# Patient Record
Sex: Female | Born: 1935 | Race: White | Hispanic: No | Smoking: Former smoker
Health system: Southern US, Community
[De-identification: ages and names within clinical notes are randomized; demographics above are authoritative.]

## PROBLEM LIST (undated history)

## (undated) DIAGNOSIS — M8580 Other specified disorders of bone density and structure, unspecified site: Secondary | ICD-10-CM

## (undated) DIAGNOSIS — S83209A Unspecified tear of unspecified meniscus, current injury, unspecified knee, initial encounter: Secondary | ICD-10-CM

## (undated) DIAGNOSIS — S82899A Other fracture of unspecified lower leg, initial encounter for closed fracture: Secondary | ICD-10-CM

## (undated) DIAGNOSIS — I10 Essential (primary) hypertension: Secondary | ICD-10-CM

## (undated) DIAGNOSIS — I491 Atrial premature depolarization: Secondary | ICD-10-CM

## (undated) DIAGNOSIS — E785 Hyperlipidemia, unspecified: Secondary | ICD-10-CM

## (undated) DIAGNOSIS — N644 Mastodynia: Secondary | ICD-10-CM

## (undated) DIAGNOSIS — N83209 Unspecified ovarian cyst, unspecified side: Secondary | ICD-10-CM

## (undated) DIAGNOSIS — K5792 Diverticulitis of intestine, part unspecified, without perforation or abscess without bleeding: Secondary | ICD-10-CM

## (undated) DIAGNOSIS — K259 Gastric ulcer, unspecified as acute or chronic, without hemorrhage or perforation: Secondary | ICD-10-CM

## (undated) HISTORY — DX: Atrial premature depolarization: I49.1

## (undated) HISTORY — DX: Unspecified ovarian cyst, unspecified side: N83.209

## (undated) HISTORY — DX: Diverticulitis of intestine, part unspecified, without perforation or abscess without bleeding: K57.92

## (undated) HISTORY — PX: MOUTH SURGERY: SHX715

## (undated) HISTORY — DX: Other fracture of unspecified lower leg, initial encounter for closed fracture: S82.899A

## (undated) HISTORY — PX: OTHER SURGICAL HISTORY: SHX169

## (undated) HISTORY — DX: Hyperlipidemia, unspecified: E78.5

## (undated) HISTORY — DX: Mastodynia: N64.4

## (undated) HISTORY — DX: Unspecified tear of unspecified meniscus, current injury, unspecified knee, initial encounter: S83.209A

## (undated) HISTORY — DX: Essential (primary) hypertension: I10

## (undated) HISTORY — DX: Gastric ulcer, unspecified as acute or chronic, without hemorrhage or perforation: K25.9

## (undated) HISTORY — DX: Other specified disorders of bone density and structure, unspecified site: M85.80

---

## 1976-06-11 HISTORY — PX: BACK SURGERY: SHX140

## 1985-06-11 HISTORY — PX: ANKLE SURGERY: SHX546

## 1998-02-21 ENCOUNTER — Other Ambulatory Visit: Admission: RE | Admit: 1998-02-21 | Discharge: 1998-02-21 | Payer: Self-pay | Admitting: Obstetrics and Gynecology

## 1998-10-05 ENCOUNTER — Ambulatory Visit (HOSPITAL_COMMUNITY): Admission: RE | Admit: 1998-10-05 | Discharge: 1998-10-05 | Payer: Self-pay | Admitting: Gastroenterology

## 1999-05-15 ENCOUNTER — Other Ambulatory Visit: Admission: RE | Admit: 1999-05-15 | Discharge: 1999-05-15 | Payer: Self-pay | Admitting: Obstetrics and Gynecology

## 2000-01-22 ENCOUNTER — Encounter: Admission: RE | Admit: 2000-01-22 | Discharge: 2000-01-22 | Payer: Self-pay | Admitting: Obstetrics and Gynecology

## 2000-01-22 ENCOUNTER — Encounter: Payer: Self-pay | Admitting: Obstetrics and Gynecology

## 2000-06-24 ENCOUNTER — Other Ambulatory Visit: Admission: RE | Admit: 2000-06-24 | Discharge: 2000-06-24 | Payer: Self-pay | Admitting: Obstetrics and Gynecology

## 2000-09-26 ENCOUNTER — Encounter: Admission: RE | Admit: 2000-09-26 | Discharge: 2000-09-26 | Payer: Self-pay | Admitting: Obstetrics and Gynecology

## 2000-09-26 ENCOUNTER — Encounter: Payer: Self-pay | Admitting: Obstetrics and Gynecology

## 2001-01-23 ENCOUNTER — Encounter: Admission: RE | Admit: 2001-01-23 | Discharge: 2001-01-23 | Payer: Self-pay | Admitting: Internal Medicine

## 2001-01-23 ENCOUNTER — Encounter: Payer: Self-pay | Admitting: Internal Medicine

## 2001-06-26 ENCOUNTER — Other Ambulatory Visit: Admission: RE | Admit: 2001-06-26 | Discharge: 2001-06-26 | Payer: Self-pay | Admitting: Obstetrics and Gynecology

## 2002-01-29 ENCOUNTER — Encounter: Payer: Self-pay | Admitting: Obstetrics and Gynecology

## 2002-01-29 ENCOUNTER — Encounter: Admission: RE | Admit: 2002-01-29 | Discharge: 2002-01-29 | Payer: Self-pay | Admitting: Obstetrics and Gynecology

## 2002-07-14 ENCOUNTER — Other Ambulatory Visit: Admission: RE | Admit: 2002-07-14 | Discharge: 2002-07-14 | Payer: Self-pay | Admitting: Obstetrics and Gynecology

## 2003-02-08 ENCOUNTER — Encounter: Payer: Self-pay | Admitting: Obstetrics and Gynecology

## 2003-02-08 ENCOUNTER — Encounter: Admission: RE | Admit: 2003-02-08 | Discharge: 2003-02-08 | Payer: Self-pay | Admitting: Obstetrics and Gynecology

## 2004-02-16 ENCOUNTER — Encounter: Admission: RE | Admit: 2004-02-16 | Discharge: 2004-02-16 | Payer: Self-pay | Admitting: Obstetrics and Gynecology

## 2004-08-01 ENCOUNTER — Other Ambulatory Visit: Admission: RE | Admit: 2004-08-01 | Discharge: 2004-08-01 | Payer: Self-pay | Admitting: *Deleted

## 2005-02-28 ENCOUNTER — Encounter: Admission: RE | Admit: 2005-02-28 | Discharge: 2005-02-28 | Payer: Self-pay | Admitting: Internal Medicine

## 2006-03-27 ENCOUNTER — Encounter: Admission: RE | Admit: 2006-03-27 | Discharge: 2006-03-27 | Payer: Self-pay | Admitting: Internal Medicine

## 2006-08-19 ENCOUNTER — Other Ambulatory Visit: Admission: RE | Admit: 2006-08-19 | Discharge: 2006-08-19 | Payer: Self-pay | Admitting: Obstetrics & Gynecology

## 2006-08-27 ENCOUNTER — Encounter: Admission: RE | Admit: 2006-08-27 | Discharge: 2006-08-27 | Payer: Self-pay | Admitting: Obstetrics & Gynecology

## 2007-04-07 ENCOUNTER — Encounter: Admission: RE | Admit: 2007-04-07 | Discharge: 2007-04-07 | Payer: Self-pay | Admitting: Internal Medicine

## 2008-04-14 ENCOUNTER — Encounter: Admission: RE | Admit: 2008-04-14 | Discharge: 2008-04-14 | Payer: Self-pay | Admitting: Internal Medicine

## 2008-08-25 ENCOUNTER — Other Ambulatory Visit: Admission: RE | Admit: 2008-08-25 | Discharge: 2008-08-25 | Payer: Self-pay | Admitting: Obstetrics & Gynecology

## 2008-09-01 ENCOUNTER — Encounter: Admission: RE | Admit: 2008-09-01 | Discharge: 2008-09-01 | Payer: Self-pay | Admitting: Obstetrics & Gynecology

## 2009-04-28 ENCOUNTER — Encounter: Admission: RE | Admit: 2009-04-28 | Discharge: 2009-04-28 | Payer: Self-pay | Admitting: Internal Medicine

## 2010-05-02 ENCOUNTER — Encounter: Admission: RE | Admit: 2010-05-02 | Discharge: 2010-05-02 | Payer: Self-pay | Admitting: Obstetrics & Gynecology

## 2011-04-02 ENCOUNTER — Other Ambulatory Visit: Payer: Self-pay | Admitting: Obstetrics & Gynecology

## 2011-04-02 DIAGNOSIS — Z1231 Encounter for screening mammogram for malignant neoplasm of breast: Secondary | ICD-10-CM

## 2011-04-17 ENCOUNTER — Encounter: Payer: Self-pay | Admitting: Cardiovascular Disease

## 2011-04-18 ENCOUNTER — Encounter: Payer: Self-pay | Admitting: Cardiovascular Disease

## 2011-04-18 ENCOUNTER — Ambulatory Visit (INDEPENDENT_AMBULATORY_CARE_PROVIDER_SITE_OTHER): Payer: Medicare Other | Admitting: Cardiovascular Disease

## 2011-04-18 DIAGNOSIS — R079 Chest pain, unspecified: Secondary | ICD-10-CM | POA: Insufficient documentation

## 2011-04-18 NOTE — Patient Instructions (Signed)
Your physician recommends that you schedule a follow-up appointment in: 2 weeks  Your physician has requested that you have en exercise stress myoview. For further information please visit [email or web address]. Please follow instruction sheet, as given.   

## 2011-04-18 NOTE — Assessment & Plan Note (Signed)
Risk factors for CAD include HTN, HLD, age, family history. Will arrange exercise stress myoview this week to exclude ischemia.

## 2011-04-18 NOTE — Progress Notes (Signed)
History of Present Illness:77 yo female with history of HTN, HLD, former tobacco abuse, GERD here today for new patient cardiac evaluation. She tells me that she was out shopping and felt dyspnea. She had no chest discomfort during that episode. She felt weak and sat down. No dizziness. No recent illnesses. She had been bitten by a tick several days before her episode. No recurrent episodes of dyspnea. She does describe frequent episodes of upper epigastric pain with rest and with exertion. This has gotten worse over the last few weeks.   Her primary care is Dr. Jacky Kindle with [facility].    Past Medical History  Diagnosis Date  . Hyperlipidemia   . Hypertension   . Osteopenia     Past Surgical History  Procedure Date  . Back surgery 1978  . Ankle surgery 1987  . Cataracts     Current Outpatient Prescriptions  Medication Sig Dispense Refill  . aspirin 81 MG tablet Take 81 mg by mouth daily.        . butabarbital (BUTISOL SODIUM) 30 MG tablet 1 tab as needed       . cholecalciferol (VITAMIN D) 1000 UNITS tablet Take 2,000 Units by mouth daily.       Marland Kitchen OVER THE COUNTER MEDICATION calicium w/ mag  daily       . rosuvastatin (CRESTOR) 10 MG tablet 10 mg. Take 1/2 tab daily      . triamterene-hydrochlorothiazide (MAXZIDE) 75-50 MG per tablet Take 1 tablet by mouth daily.        . verapamil (CALAN-SR) 240 MG CR tablet Take 240 mg by mouth daily.          Allergies  Allergen Reactions  . Codeine   . Penicillins     History   Social History  . Marital Status: Single    Spouse Name: N/A    Number of Children: N/A  . Years of Education: N/A   Occupational History  . Not on file.   Social History Main Topics  . Smoking status: Former Games developer  . Smokeless tobacco: Not on file   Comment: 20 yrs ago.  . Alcohol Use: 3.0 oz/week    5 Glasses of wine per week  . Drug Use: No  . Sexually Active:    Other Topics Concern  . Not on file   Social History Narrative    . No narrative on file    No family history on file.  Review of Systems:  As stated in the HPI and otherwise negative.   BP 145/78  Pulse 85  Ht 5' (1.524 m)  Wt 117 lb (53.071 kg)  BMI 22.85 kg/m2  Physical Examination: General: Well developed, well nourished, NAD HEENT: OP clear, mucus membranes moist SKIN: warm, dry. No rashes. Neuro: No focal deficits Musculoskeletal: Muscle strength 5/5 all ext Psychiatric: Mood and affect normal Neck: No JVD, no carotid bruits, no thyromegaly, no lymphadenopathy. Lungs:Clear bilaterally, no wheezes, rhonci, crackles Cardiovascular: Regular rate and rhythm. No murmurs, gallops or rubs. Abdomen:Soft. Bowel sounds present. Non-tender.  Extremities: No lower extremity edema. Pulses are 2 + in the bilateral DP/PT.  WUJ:WJXBJY sinus rhythm.

## 2011-04-19 ENCOUNTER — Ambulatory Visit (HOSPITAL_COMMUNITY): Payer: Medicare Other | Attending: Internal Medicine | Admitting: Radiology

## 2011-04-19 DIAGNOSIS — R61 Generalized hyperhidrosis: Secondary | ICD-10-CM | POA: Insufficient documentation

## 2011-04-19 DIAGNOSIS — R0789 Other chest pain: Secondary | ICD-10-CM | POA: Insufficient documentation

## 2011-04-19 DIAGNOSIS — R079 Chest pain, unspecified: Secondary | ICD-10-CM

## 2011-04-19 DIAGNOSIS — I10 Essential (primary) hypertension: Secondary | ICD-10-CM | POA: Insufficient documentation

## 2011-04-19 DIAGNOSIS — R0602 Shortness of breath: Secondary | ICD-10-CM

## 2011-04-19 DIAGNOSIS — E785 Hyperlipidemia, unspecified: Secondary | ICD-10-CM | POA: Insufficient documentation

## 2011-04-19 DIAGNOSIS — R9431 Abnormal electrocardiogram [ECG] [EKG]: Secondary | ICD-10-CM

## 2011-04-19 DIAGNOSIS — R5381 Other malaise: Secondary | ICD-10-CM | POA: Insufficient documentation

## 2011-04-19 DIAGNOSIS — R42 Dizziness and giddiness: Secondary | ICD-10-CM | POA: Insufficient documentation

## 2011-04-19 DIAGNOSIS — Z8249 Family history of ischemic heart disease and other diseases of the circulatory system: Secondary | ICD-10-CM | POA: Insufficient documentation

## 2011-04-19 DIAGNOSIS — R0609 Other forms of dyspnea: Secondary | ICD-10-CM | POA: Insufficient documentation

## 2011-04-19 DIAGNOSIS — R0989 Other specified symptoms and signs involving the circulatory and respiratory systems: Secondary | ICD-10-CM | POA: Insufficient documentation

## 2011-04-19 DIAGNOSIS — Z87891 Personal history of nicotine dependence: Secondary | ICD-10-CM | POA: Insufficient documentation

## 2011-04-19 MED ORDER — TECHNETIUM TC 99M TETROFOSMIN IV KIT
11.0000 | PACK | Freq: Once | INTRAVENOUS | Status: AC | PRN
  Administered 2011-04-19: 11 via INTRAVENOUS

## 2011-04-19 MED ORDER — REGADENOSON 0.4 MG/5ML IV SOLN
0.4000 mg | Freq: Once | INTRAVENOUS | Status: AC
  Administered 2011-04-19: 0.4 mg via INTRAVENOUS

## 2011-04-19 MED ORDER — TECHNETIUM TC 99M TETROFOSMIN IV KIT
33.0000 | PACK | Freq: Once | INTRAVENOUS | Status: AC | PRN
  Administered 2011-04-19: 33 via INTRAVENOUS

## 2011-04-19 NOTE — Progress Notes (Signed)
[facility] SITE 3 NUCLEAR MED [address] [place] [address] [phone]  Cardiology Nuclear Med Study  Lori Boone is a 77 y.o. female [id number] 10-14-1925   Nuclear Med Background Indication for Stress Test:  Evaluation for Ischemia History:  No previous documented CAD Cardiac Risk Factors: Family History - CAD, History of Smoking, Hypertension and Lipids  Symptoms:  Chest Pain/"Discomfort" with and without Exertion (last episode of chest discomfort was about 3-days ago), Diaphoresis, Dizziness, DOE and Fatigue   Nuclear Pre-Procedure Caffeine/Decaff Intake:  None NPO After: 8:00pm   Lungs:  Clear.  O2 SAT 98% on RA. IV 0.9% NS with Angio Cath:  20g  IV Site: R Antecubital x 1, tolerated well IV Started by:  Irean Hong, RN  Chest Size (in):  36 Cup Size: B  Height: 5' (1.524 m)  Weight:  114 lb (51.71 kg)  BMI:  Body mass index is 22.26 kg/(m^2). Tech Comments:  No medications today.    Nuclear Med Study 1 or 2 day study: 1 day  Stress Test Type:  Eugenie Birks  Reading MD: Dietrich Pates, MD  Order Authorizing Provider:  Earney Hamburg, MD  Resting Radionuclide: Technetium 64m Tetrofosmin  Resting Radionuclide Dose: 11.0 mCi   Stress Radionuclide:  Technetium 36m Tetrofosmin  Stress Radionuclide Dose: 33.0 mCi           Stress Protocol Rest HR: 68 Stress HR: 123  Rest BP: 137/79 Stress BP: 149/81  Exercise Time (min): n/a METS: n/a   Predicted Max HR: 137 bpm % Max HR: 89.78 bpm Rate Pressure Product: 69629   Dose of Adenosine (mg):  n/a Dose of Lexiscan: 0.4 mg  Dose of Atropine (mg): n/a Dose of Dobutamine: n/a mcg/kg/min (at max HR)  Stress Test Technologist: Smiley Houseman, CMA-N  Nuclear Technologist:  Domenic Polite, CNMT     Rest Procedure:  Myocardial perfusion imaging was performed at rest 45 minutes following the intravenous administration of Technetium 33m Tetrofosmin.  Rest ECG: Nonspecific ST-T wave changes.  Stress  Procedure:  The patient was initially scheduled to walk the treadmill utilizing the Bruce protocol, but due to baseline EKG changes and no EKG for comparison, Dr. Elease Hashimoto felt it was  better to do a Lexiscan on the patient.  She then received IV Lexiscan 0.4 mg over 15-seconds.  Technetium 71m Tetrofosmin injected at 30-seconds.  There were no significant changes with Lexiscan.  Quantitative spect images were obtained after a 45 minute delay.  Stress BMW:UXLK than 1 mm ST depression in the inferior leads.  QPS Raw Data Images:  Normal; no motion artifact; normal heart/lung ratio. Stress Images:  Normal homogeneous uptake in all areas of the myocardium. Rest Images:  Normal homogeneous uptake in all areas of the myocardium. Subtraction (SDS):  No evidence of ischemia. Transient Ischemic Dilatation (Normal <1.22):  0.87 Lung/Heart Ratio (Normal <0.45):  0.10  Quantitative Gated Spect Images QGS EDV:  34 ml QGS ESV:  5 ml QGS cine images:  NL LV Function; NL Wall Motion QGS EF: 84%  Impression Exercise Capacity:  Lexiscan with no exercise. BP Response:  Normal blood pressure response. Clinical Symptoms:  No chest pain. ECG Impression:  Less than 1 mm ST depression in the inferior leads. Comparison with Prior Nuclear Study: No previous nuclear study performed  Overall Impression:  Normal stress nuclear study.  LVEF greater than 70%. Dietrich Pates

## 2011-04-20 ENCOUNTER — Telehealth: Payer: Self-pay | Admitting: Cardiovascular Disease

## 2011-04-20 NOTE — Telephone Encounter (Signed)
Spoke with pt and gave her preliminary report of normal nuclear study. I told her we would call her back if Dr. Clifton James had any additional comments. She has follow up appointment with Dr. Clifton James later this month.

## 2011-04-20 NOTE — Telephone Encounter (Signed)
New message:  Pt had nuclear yesterday and daughter wants results before she leaves town this afternoon.  Please call and let her know results.

## 2011-04-24 ENCOUNTER — Telehealth: Payer: Self-pay | Admitting: *Deleted

## 2011-04-24 NOTE — Telephone Encounter (Signed)
Message copied by Burnell Blanks on Tue Apr 24, 2011 10:04 AM ------      Message from: Verne Carrow D      Created: Mon Apr 23, 2011 10:49 AM       Stress test is normal. No evidence of ischemia. Can we let the pt know? Thanks, chris

## 2011-04-24 NOTE — Telephone Encounter (Signed)
Advised of stress test results 

## 2011-04-30 ENCOUNTER — Ambulatory Visit (INDEPENDENT_AMBULATORY_CARE_PROVIDER_SITE_OTHER): Payer: Medicare Other | Admitting: Cardiovascular Disease

## 2011-04-30 ENCOUNTER — Encounter: Payer: Self-pay | Admitting: Cardiovascular Disease

## 2011-04-30 VITALS — BP 128/68 | HR 74 | Ht 59.0 in | Wt 117.0 lb

## 2011-04-30 DIAGNOSIS — R0600 Dyspnea, unspecified: Secondary | ICD-10-CM | POA: Insufficient documentation

## 2011-04-30 DIAGNOSIS — R0989 Other specified symptoms and signs involving the circulatory and respiratory systems: Secondary | ICD-10-CM

## 2011-04-30 NOTE — Patient Instructions (Signed)
Your physician wants you to follow-up in: 12 months.  You will receive a reminder letter in the mail two months in advance. If you don't receive a letter, please call our office to schedule the follow-up appointment.  Your physician recommends that you continue on your current medications as directed. Please refer to the Current Medication list given to you today.   

## 2011-04-30 NOTE — Progress Notes (Signed)
History of Present Illness:77 yo female with history of HTN, HLD, former tobacco abuse, GERD here today for cardiac follow up. I saw her as a new patient two weeks ago for evaluation of dyspnea.  She told  me that she was out shopping and felt dyspnea. She had no chest discomfort during that episode. She felt weak and sat down. No dizziness. No recent illnesses. She had been bitten by a tick several days before her episode. No recurrent episodes of dyspnea. She did describe frequent episodes of upper epigastric pain with rest and with exertion. I arranged a stress myoview. Given her baseline EKG changes, Lexiscan was used. Her LVEF was 84%. There was no evidence of ischemia.  She has had no recurrence of her dyspnea. No chest pain or dizziness. She feels great.   Her primary care is Dr. Jacky Kindle with [facility].    Past Medical History  Diagnosis Date  . Hyperlipidemia   . Hypertension   . Osteopenia     Past Surgical History  Procedure Date  . Back surgery 1978  . Ankle surgery 1987  . Cataracts     Current Outpatient Prescriptions  Medication Sig Dispense Refill  . aspirin 81 MG tablet Take 81 mg by mouth daily.        . butabarbital (BUTISOL SODIUM) 30 MG tablet 1 tab as needed       . cholecalciferol (VITAMIN D) 1000 UNITS tablet Take 2,000 Units by mouth daily.       Marland Kitchen OVER THE COUNTER MEDICATION calicium w/ mag  daily       . rosuvastatin (CRESTOR) 10 MG tablet 10 mg. Take 1/2 tab daily      . triamterene-hydrochlorothiazide (MAXZIDE) 75-50 MG per tablet Take 1 tablet by mouth daily.        . verapamil (CALAN-SR) 240 MG CR tablet Take 240 mg by mouth daily.         Allergies  Allergen Reactions  . Codeine   . Penicillins     History   Social History  . Marital Status: Single    Spouse Name: N/A    Number of Children: 2  . Years of Education: N/A   Occupational History  . Retired office work    Social History Main Topics  . Smoking status: Never  Smoker   . Smokeless tobacco: Not on file   Comment: 20 yrs ago.  . Alcohol Use: 3.0 oz/week    5 Glasses of wine per week  . Drug Use: No  . Sexually Active: Not on file   Other Topics Concern  . Not on file   Social History Narrative  . No narrative on file    Family History  Problem Relation Age of Onset  . Coronary artery disease Father   . Heart attack Father   . Heart failure Mother     Review of Systems:  As stated in the HPI and otherwise negative.   BP 128/68  Pulse 74  Ht 4\' 11"  (1.499 m)  Wt 117 lb (53.071 kg)  BMI 23.63 kg/m2  Physical Examination: General: Well developed, well nourished, NAD HEENT: OP clear, mucus membranes moist SKIN: warm, dry. No rashes. Neuro: No focal deficits Musculoskeletal: Muscle strength 5/5 all ext Psychiatric: Mood and affect normal Neck: No JVD, no carotid bruits, no thyromegaly, no lymphadenopathy. Lungs:Clear bilaterally, no wheezes, rhonci, crackles Cardiovascular: Regular rate and rhythm. No murmurs, gallops or rubs. Abdomen:Soft. Bowel sounds present. Non-tender.  Extremities: No  lower extremity edema. Pulses are 2 + in the bilateral DP/PT.  Stress myoview: 04/20/11:   Stress Procedure: The patient was initially scheduled to walk the treadmill utilizing the Bruce protocol, but due to baseline EKG changes and no EKG for comparison, Dr. Elease Hashimoto felt it was better to do a Lexiscan on the patient. She then received IV Lexiscan 0.4 mg over 15-seconds. Technetium 63m Tetrofosmin injected at 30-seconds. There were no significant changes with Lexiscan. Quantitative spect images were obtained after a 45 minute delay.  Stress ZOX:WRUE than 1 mm ST depression in the inferior leads.  QPS  Raw Data Images: Normal; no motion artifact; normal heart/lung ratio.  Stress Images: Normal homogeneous uptake in all areas of the myocardium.  Rest Images: Normal homogeneous uptake in all areas of the myocardium.  Subtraction (SDS): No evidence  of ischemia.  Transient Ischemic Dilatation (Normal <1.22): 0.87  Lung/Heart Ratio (Normal <0.45): 0.10  Quantitative Gated Spect Images  QGS EDV: 34 ml  QGS ESV: 5 ml  QGS cine images: NL LV Function; NL Wall Motion  QGS EF: 84%  Impression  Exercise Capacity: Lexiscan with no exercise.  BP Response: Normal blood pressure response.  Clinical Symptoms: No chest pain.  ECG Impression: Less than 1 mm ST depression in the inferior leads.  Comparison with Prior Nuclear Study: No previous nuclear study performed  Overall Impression: Normal stress nuclear study. LVEF greater than 70%.

## 2011-04-30 NOTE — Assessment & Plan Note (Signed)
No evidence of ischemia on stress test. LV function is normal. No further cardiac workup.

## 2011-05-07 ENCOUNTER — Ambulatory Visit: Payer: Self-pay

## 2011-05-21 ENCOUNTER — Ambulatory Visit
Admission: RE | Admit: 2011-05-21 | Discharge: 2011-05-21 | Disposition: A | Discharge status: Home / Self Care | Payer: Medicare Other | Source: Ambulatory Visit | Attending: Obstetrics & Gynecology | Admitting: Obstetrics & Gynecology

## 2011-05-21 DIAGNOSIS — Z1231 Encounter for screening mammogram for malignant neoplasm of breast: Secondary | ICD-10-CM

## 2011-06-19 DIAGNOSIS — E785 Hyperlipidemia, unspecified: Secondary | ICD-10-CM | POA: Diagnosis not present

## 2011-06-19 DIAGNOSIS — R82998 Other abnormal findings in urine: Secondary | ICD-10-CM | POA: Diagnosis not present

## 2011-06-19 DIAGNOSIS — I10 Essential (primary) hypertension: Secondary | ICD-10-CM | POA: Diagnosis not present

## 2011-06-26 DIAGNOSIS — Z0000 Encounter for general adult medical examination without abnormal findings: Secondary | ICD-10-CM | POA: Diagnosis not present

## 2011-06-26 DIAGNOSIS — M1990 Unspecified osteoarthritis, unspecified site: Secondary | ICD-10-CM | POA: Diagnosis not present

## 2011-06-26 DIAGNOSIS — E785 Hyperlipidemia, unspecified: Secondary | ICD-10-CM | POA: Diagnosis not present

## 2011-06-26 DIAGNOSIS — I10 Essential (primary) hypertension: Secondary | ICD-10-CM | POA: Diagnosis not present

## 2011-06-27 DIAGNOSIS — Z1212 Encounter for screening for malignant neoplasm of rectum: Secondary | ICD-10-CM | POA: Diagnosis not present

## 2011-06-27 DIAGNOSIS — N83209 Unspecified ovarian cyst, unspecified side: Secondary | ICD-10-CM | POA: Diagnosis not present

## 2011-07-11 DIAGNOSIS — D485 Neoplasm of uncertain behavior of skin: Secondary | ICD-10-CM | POA: Diagnosis not present

## 2011-07-11 DIAGNOSIS — D235 Other benign neoplasm of skin of trunk: Secondary | ICD-10-CM | POA: Diagnosis not present

## 2011-10-05 ENCOUNTER — Ambulatory Visit (INDEPENDENT_AMBULATORY_CARE_PROVIDER_SITE_OTHER): Payer: Medicare Other | Admitting: Family Medicine

## 2011-10-05 ENCOUNTER — Telehealth: Payer: Self-pay

## 2011-10-05 VITALS — BP 168/68 | HR 79 | Temp 97.8°F | Resp 18 | Ht 60.0 in | Wt 120.0 lb

## 2011-10-05 DIAGNOSIS — J321 Chronic frontal sinusitis: Secondary | ICD-10-CM | POA: Diagnosis not present

## 2011-10-05 DIAGNOSIS — R040 Epistaxis: Secondary | ICD-10-CM | POA: Diagnosis not present

## 2011-10-05 DIAGNOSIS — J019 Acute sinusitis, unspecified: Secondary | ICD-10-CM | POA: Diagnosis not present

## 2011-10-05 DIAGNOSIS — J3489 Other specified disorders of nose and nasal sinuses: Secondary | ICD-10-CM | POA: Diagnosis not present

## 2011-10-05 MED ORDER — MOMETASONE FUROATE 50 MCG/ACT NA SUSP: 2.0000 | Freq: Every day | NASAL | Status: DC

## 2011-10-05 MED ORDER — LEVOFLOXACIN 500 MG PO TABS: 500.0000 mg | ORAL_TABLET | Freq: Every day | ORAL | Status: AC

## 2011-10-05 NOTE — Patient Instructions (Signed)

## 2011-10-05 NOTE — Progress Notes (Signed)
78 yo woman with facial pressure and pain for 2 weeks, now worsening and pain is not controlled by tylenol and saline washes.   Associated malaise, headache, occoasional right epistaxis, and lightheadedness.  O:  NAD, alert Neck:  Supple no adenopathy TM's normal Nose:  Dried blood right nasal passage Oroph:  Clear  A:  Chronic sinusitis  P:  levaquin nasonex CT scan

## 2011-10-06 ENCOUNTER — Other Ambulatory Visit: Payer: Self-pay | Admitting: Family Medicine

## 2011-10-06 ENCOUNTER — Ambulatory Visit (HOSPITAL_BASED_OUTPATIENT_CLINIC_OR_DEPARTMENT_OTHER)
Admission: RE | Admit: 2011-10-06 | Discharge: 2011-10-06 | Disposition: A | Discharge status: Home / Self Care | Payer: Medicare Other | Source: Ambulatory Visit | Attending: Family Medicine | Admitting: Family Medicine

## 2011-10-06 DIAGNOSIS — G501 Atypical facial pain: Secondary | ICD-10-CM

## 2011-10-06 DIAGNOSIS — R040 Epistaxis: Secondary | ICD-10-CM | POA: Diagnosis not present

## 2011-10-06 DIAGNOSIS — J342 Deviated nasal septum: Secondary | ICD-10-CM | POA: Diagnosis not present

## 2011-10-06 DIAGNOSIS — J3489 Other specified disorders of nose and nasal sinuses: Secondary | ICD-10-CM | POA: Insufficient documentation

## 2011-10-08 NOTE — Progress Notes (Signed)
LMOM that everything was normal on ct

## 2011-11-27 DIAGNOSIS — Z01419 Encounter for gynecological examination (general) (routine) without abnormal findings: Secondary | ICD-10-CM | POA: Diagnosis not present

## 2011-11-27 DIAGNOSIS — Z124 Encounter for screening for malignant neoplasm of cervix: Secondary | ICD-10-CM | POA: Diagnosis not present

## 2011-12-24 DIAGNOSIS — I10 Essential (primary) hypertension: Secondary | ICD-10-CM | POA: Diagnosis not present

## 2011-12-24 DIAGNOSIS — M1990 Unspecified osteoarthritis, unspecified site: Secondary | ICD-10-CM | POA: Diagnosis not present

## 2011-12-24 DIAGNOSIS — E785 Hyperlipidemia, unspecified: Secondary | ICD-10-CM | POA: Diagnosis not present

## 2011-12-27 ENCOUNTER — Ambulatory Visit (INDEPENDENT_AMBULATORY_CARE_PROVIDER_SITE_OTHER): Payer: Medicare Other | Admitting: *Deleted

## 2011-12-27 DIAGNOSIS — R0989 Other specified symptoms and signs involving the circulatory and respiratory systems: Secondary | ICD-10-CM | POA: Diagnosis not present

## 2012-01-07 NOTE — Procedures (Unsigned)
CAROTID DUPLEX EXAM  INDICATION:  Bruit  HISTORY: Diabetes:  No Cardiac:  No Hypertension:  Yes Smoking:  Previous Previous Surgery: CV History:  Asymptomatic Amaurosis Fugax No, Paresthesias __________, Hemiparesis No                                      RIGHT             LEFT Brachial systolic pressure:         144               144 Brachial Doppler waveforms:         Triphasic         Triphasic Vertebral direction of flow:        Antegrade         Antegrade DUPLEX VELOCITIES (cm/sec) CCA peak systolic                   109               106 ECA peak systolic                   88                79 ICA peak systolic                   126 (distal ICA)  104 ICA end diastolic                   33                20 PLAQUE MORPHOLOGY:                  Homogeneous       Homogeneous PLAQUE AMOUNT:                      Minimal           Minimal PLAQUE LOCATION:                    ICA               ECA  IMPRESSION:  1-39% ICA stenosis bilaterally. Vertebral artery flow antegrade bilaterally.  ___________________________________________ V. Charlena Cross, MD  SS/MEDQ  D:  12/27/2011  T:  12/27/2011  Job:  [id number]

## 2012-01-09 DIAGNOSIS — H33329 Round hole, unspecified eye: Secondary | ICD-10-CM | POA: Diagnosis not present

## 2012-01-09 DIAGNOSIS — H04129 Dry eye syndrome of unspecified lacrimal gland: Secondary | ICD-10-CM | POA: Diagnosis not present

## 2012-01-09 DIAGNOSIS — Z961 Presence of intraocular lens: Secondary | ICD-10-CM | POA: Diagnosis not present

## 2012-03-29 DIAGNOSIS — Z23 Encounter for immunization: Secondary | ICD-10-CM | POA: Diagnosis not present

## 2012-04-15 ENCOUNTER — Other Ambulatory Visit: Payer: Self-pay | Admitting: Obstetrics & Gynecology

## 2012-04-15 DIAGNOSIS — Z1231 Encounter for screening mammogram for malignant neoplasm of breast: Secondary | ICD-10-CM

## 2012-05-05 ENCOUNTER — Encounter: Payer: Self-pay | Admitting: Cardiovascular Disease

## 2012-05-05 ENCOUNTER — Ambulatory Visit (INDEPENDENT_AMBULATORY_CARE_PROVIDER_SITE_OTHER): Payer: Medicare Other | Admitting: Cardiovascular Disease

## 2012-05-05 VITALS — BP 160/79 | HR 78 | Ht 59.0 in | Wt 121.0 lb

## 2012-05-05 DIAGNOSIS — R002 Palpitations: Secondary | ICD-10-CM

## 2012-05-05 LAB — CBC WITH DIFFERENTIAL/PLATELET
Basophils Absolute: 0 10*3/uL (ref 0.0–0.1)
Eosinophils Absolute: 0.1 10*3/uL (ref 0.0–0.7)
HCT: 43.9 % (ref 36.0–46.0)
Hemoglobin: 14.4 g/dL (ref 12.0–15.0)
Lymphocytes Relative: 38.9 % (ref 12.0–46.0)
Lymphs Abs: 3.5 10*3/uL (ref 0.7–4.0)
Monocytes Relative: 8.5 % (ref 3.0–12.0)
Neutrophils Relative %: 51.2 % (ref 43.0–77.0)
RBC: 4.64 Mil/uL (ref 3.87–5.11)
RDW: 12.9 % (ref 11.5–14.6)

## 2012-05-05 LAB — BASIC METABOLIC PANEL
Creatinine, Ser: 0.9 mg/dL (ref 0.4–1.2)
Glucose, Bld: 97 mg/dL (ref 70–99)

## 2012-05-05 NOTE — Patient Instructions (Signed)
Your physician recommends that you schedule a follow-up appointment in:  6 weeks.   Your physician has recommended that you wear an event monitor. Event monitors are medical devices that record the heart's electrical activity. Doctors most often [place] these monitors to diagnose arrhythmias. Arrhythmias are problems with the speed or rhythm of the heartbeat. The monitor is a small, portable device. You can wear one while you do your normal daily activities. This is usually used to diagnose what is causing palpitations/syncope (passing out).

## 2012-05-05 NOTE — Progress Notes (Signed)
History of Present Illness: 78 yo female with history of HTN, HLD, former tobacco abuse, GERD here today for cardiac follow up. I saw her for the first time 04/18/11 with c/o weakness and dyspnea. She had no chest discomfort during that episode. She felt weak and sat down. No dizziness. No recent illnesses. She had been bitten by a tick several days before her episode. She did describe frequent episodes of upper epigastric pain with rest and with exertion. I arranged a stress myoview. Given her baseline EKG changes, Lexiscan was used. Her LVEF was 84%. There was no evidence of ischemia. She had carotid dopplers July 2013 in the [facility] office with mild bilateral carotid artery stenosis.   She is here today for follow up. She reports that she continues to have episodes of weakness and fatigue and dyspnea. These may be related to palpitations. No chest pain. Symptoms are not exertional. No syncope or near syncope.   Primary Care Physician: Dr. Jacky Kindle with [facility]  Last Lipid Profile: Followed in primary care.    Past Medical History  Diagnosis Date  . Hyperlipidemia   . Hypertension   . Osteopenia     Past Surgical History  Procedure Date  . Back surgery 1978  . Ankle surgery 1987  . Cataracts     Current Outpatient Prescriptions  Medication Sig Dispense Refill  . aspirin 81 MG tablet Take 81 mg by mouth daily.        . butabarbital (BUTISOL SODIUM) 30 MG tablet 1 tab as needed       . cholecalciferol (VITAMIN D) 1000 UNITS tablet Take 2,000 Units by mouth daily.       . mometasone (NASONEX) 50 MCG/ACT nasal spray Place 2 sprays into the nose daily.  17 g  12  . OVER THE COUNTER MEDICATION calicium w/ mag  daily       . rosuvastatin (CRESTOR) 10 MG tablet 10 mg. Take 1/2 tab daily      . triamterene-hydrochlorothiazide (MAXZIDE) 75-50 MG per tablet Take 1 tablet by mouth daily.        . verapamil (CALAN-SR) 240 MG CR tablet Take 240 mg by mouth daily.          Allergies  Allergen Reactions  . Codeine   . Penicillins     History   Social History  . Marital Status: Single    Spouse Name: N/A    Number of Children: 2  . Years of Education: N/A   Occupational History  . Retired office work    Social History Main Topics  . Smoking status: Never Smoker   . Smokeless tobacco: Not on file     Comment: 20 yrs ago.  . Alcohol Use: 3.0 oz/week    5 Glasses of wine per week  . Drug Use: No  . Sexually Active: Not on file   Other Topics Concern  . Not on file   Social History Narrative  . No narrative on file    Family History  Problem Relation Age of Onset  . Coronary artery disease Father   . Heart attack Father   . Heart failure Mother     Review of Systems:  As stated in the HPI and otherwise negative.   BP 160/79  Pulse 78  Ht 4\' 11"  (1.499 m)  Wt 121 lb (54.885 kg)  BMI 24.44 kg/m2  Physical Examination: General: Well developed, well nourished, NAD HEENT: OP clear, mucus membranes moist SKIN: warm,  dry. No rashes. Neuro: No focal deficits Musculoskeletal: Muscle strength 5/5 all ext Psychiatric: Mood and affect normal Neck: No JVD, no carotid bruits, no thyromegaly, no lymphadenopathy. Lungs:Clear bilaterally, no wheezes, rhonci, crackles Cardiovascular: Regular rate and rhythm. No murmurs, gallops or rubs. Abdomen:Soft. Bowel sounds present. Non-tender.  Extremities: No lower extremity edema. Pulses are 2 + in the bilateral DP/PT.  EKG: NSR, rate 78 bpm. QTc 485 msec.   Assessment and Plan:   1. Palpitations: She may be having arrythmias. Will arrange 21 day event monitor. Will check TSH, BMET and CBC today.

## 2012-05-15 ENCOUNTER — Encounter (INDEPENDENT_AMBULATORY_CARE_PROVIDER_SITE_OTHER): Payer: Medicare Other

## 2012-05-15 DIAGNOSIS — R002 Palpitations: Secondary | ICD-10-CM

## 2012-05-27 ENCOUNTER — Ambulatory Visit: Payer: Medicare Other

## 2012-06-19 ENCOUNTER — Ambulatory Visit: Payer: Medicare Other | Admitting: Cardiovascular Disease

## 2012-06-25 DIAGNOSIS — I10 Essential (primary) hypertension: Secondary | ICD-10-CM | POA: Diagnosis not present

## 2012-06-25 DIAGNOSIS — E785 Hyperlipidemia, unspecified: Secondary | ICD-10-CM | POA: Diagnosis not present

## 2012-06-30 ENCOUNTER — Ambulatory Visit
Admission: RE | Admit: 2012-06-30 | Discharge: 2012-06-30 | Disposition: A | Discharge status: Home / Self Care | Payer: Medicare Other | Source: Ambulatory Visit | Attending: Obstetrics & Gynecology | Admitting: Obstetrics & Gynecology

## 2012-06-30 DIAGNOSIS — Z1231 Encounter for screening mammogram for malignant neoplasm of breast: Secondary | ICD-10-CM | POA: Diagnosis not present

## 2012-07-02 DIAGNOSIS — M1990 Unspecified osteoarthritis, unspecified site: Secondary | ICD-10-CM | POA: Diagnosis not present

## 2012-07-02 DIAGNOSIS — Z0000 Encounter for general adult medical examination without abnormal findings: Secondary | ICD-10-CM | POA: Diagnosis not present

## 2012-07-02 DIAGNOSIS — I10 Essential (primary) hypertension: Secondary | ICD-10-CM | POA: Diagnosis not present

## 2012-07-02 DIAGNOSIS — E785 Hyperlipidemia, unspecified: Secondary | ICD-10-CM | POA: Diagnosis not present

## 2012-07-08 DIAGNOSIS — Z1212 Encounter for screening for malignant neoplasm of rectum: Secondary | ICD-10-CM | POA: Diagnosis not present

## 2012-07-25 ENCOUNTER — Ambulatory Visit: Payer: Medicare Other | Admitting: Cardiovascular Disease

## 2012-07-31 ENCOUNTER — Encounter: Payer: Self-pay | Admitting: Cardiovascular Disease

## 2012-07-31 ENCOUNTER — Ambulatory Visit (INDEPENDENT_AMBULATORY_CARE_PROVIDER_SITE_OTHER): Payer: Medicare Other | Admitting: Cardiovascular Disease

## 2012-07-31 VITALS — BP 122/67 | HR 78 | Ht 59.0 in | Wt 121.0 lb

## 2012-07-31 DIAGNOSIS — R002 Palpitations: Secondary | ICD-10-CM | POA: Diagnosis not present

## 2012-07-31 NOTE — Progress Notes (Signed)
History of Present Illness: 79 yo female with history of HTN, HLD, former tobacco abuse, GERD here today for cardiac follow up. I saw her for the first time 04/18/11 with c/o weakness and dyspnea. She had no chest discomfort during that episode. She felt weak and sat down. No dizziness. No recent illnesses. She had been bitten by a tick several days before her episode. She did describe frequent episodes of upper epigastric pain with rest and with exertion. I arranged a stress myoview. Given her baseline EKG changes, Lexiscan was used. Her LVEF was 84%. There was no evidence of ischemia. She had carotid dopplers July 2013 in the [facility] office with mild bilateral carotid artery stenosis. I saw her last in November 2013 and she had continued complaints of weakness, fatigue and dyspnea which seemed to be associated with palpitations. No chest pain. I arranged a 21 day event monitor which showed PACs but no atrial fib/flutter or SVT.   She is here today for follow up.  She is feeling well otherwise.   Primary Care Physician: Dr. Jacky Kindle with [facility]   Last Lipid Profile: Followed in primary care.   Past Medical History  Diagnosis Date  . Hyperlipidemia   . Hypertension   . Osteopenia   . PAC (premature atrial contraction)     Past Surgical History  Procedure Laterality Date  . Back surgery  1978  . Ankle surgery  1987  . Cataracts      Current Outpatient Prescriptions  Medication Sig Dispense Refill  . aspirin 81 MG tablet Take 81 mg by mouth daily.        . butabarbital (BUTISOL SODIUM) 30 MG tablet 1 tab as needed       . cholecalciferol (VITAMIN D) 1000 UNITS tablet Take 2,000 Units by mouth daily.       . mometasone (NASONEX) 50 MCG/ACT nasal spray Place 2 sprays into the nose daily.  17 g  12  . OVER THE COUNTER MEDICATION calicium w/ mag  daily       . rosuvastatin (CRESTOR) 10 MG tablet 10 mg. Take 1/2 tab daily      . triamterene-hydrochlorothiazide (MAXZIDE)  75-50 MG per tablet Take 1 tablet by mouth daily.        . verapamil (CALAN-SR) 240 MG CR tablet Take 240 mg by mouth daily.        No current facility-administered medications for this visit.    Allergies  Allergen Reactions  . Codeine   . Penicillins     History   Social History  . Marital Status: Single    Spouse Name: N/A    Number of Children: 2  . Years of Education: N/A   Occupational History  . Retired office work    Social History Main Topics  . Smoking status: Never Smoker   . Smokeless tobacco: Not on file     Comment: 20 yrs ago.  . Alcohol Use: 3.0 oz/week    5 Glasses of wine per week  . Drug Use: No  . Sexually Active: Not on file   Other Topics Concern  . Not on file   Social History Narrative  . No narrative on file    Family History  Problem Relation Age of Onset  . Coronary artery disease Father   . Heart attack Father   . Heart failure Mother     Review of Systems:  As stated in the HPI and otherwise negative.   BP  122/67  Pulse 78  Ht 4\' 11"  (1.499 m)  Wt 121 lb (54.885 kg)  BMI 24.43 kg/m2  Physical Examination: General: Well developed, well nourished, NAD HEENT: OP clear, mucus membranes moist SKIN: warm, dry. No rashes. Neuro: No focal deficits Musculoskeletal: Muscle strength 5/5 all ext Psychiatric: Mood and affect normal Neck: No JVD, no carotid bruits, no thyromegaly, no lymphadenopathy. Lungs:Clear bilaterally, no wheezes, rhonci, crackles Cardiovascular: Regular rate and rhythm. No murmurs, gallops or rubs. Abdomen:Soft. Bowel sounds present. Non-tender.  Extremities: No lower extremity edema. Pulses are 2 + in the bilateral DP/PT.  Assessment and Plan:   1. Palpitations: She was found to have premature atrial contractions on 21 day event monitor. No evidence of atrial fibrillation/flutter or SVT.

## 2012-07-31 NOTE — Patient Instructions (Addendum)
Your physician wants you to follow-up in:  6 months. You will receive a reminder letter in the mail two months in advance. If you don't receive a letter, please call our office to schedule the follow-up appointment.   

## 2012-08-20 DIAGNOSIS — D485 Neoplasm of uncertain behavior of skin: Secondary | ICD-10-CM | POA: Diagnosis not present

## 2012-08-20 DIAGNOSIS — D235 Other benign neoplasm of skin of trunk: Secondary | ICD-10-CM | POA: Diagnosis not present

## 2012-09-24 DIAGNOSIS — J309 Allergic rhinitis, unspecified: Secondary | ICD-10-CM | POA: Diagnosis not present

## 2012-09-24 DIAGNOSIS — J029 Acute pharyngitis, unspecified: Secondary | ICD-10-CM | POA: Diagnosis not present

## 2012-09-30 DIAGNOSIS — K219 Gastro-esophageal reflux disease without esophagitis: Secondary | ICD-10-CM | POA: Diagnosis not present

## 2012-09-30 DIAGNOSIS — J029 Acute pharyngitis, unspecified: Secondary | ICD-10-CM | POA: Diagnosis not present

## 2012-09-30 DIAGNOSIS — H6980 Other specified disorders of Eustachian tube, unspecified ear: Secondary | ICD-10-CM | POA: Diagnosis not present

## 2012-09-30 DIAGNOSIS — J309 Allergic rhinitis, unspecified: Secondary | ICD-10-CM | POA: Diagnosis not present

## 2012-10-13 DIAGNOSIS — IMO0002 Reserved for concepts with insufficient information to code with codable children: Secondary | ICD-10-CM | POA: Diagnosis not present

## 2012-10-13 DIAGNOSIS — J029 Acute pharyngitis, unspecified: Secondary | ICD-10-CM | POA: Diagnosis not present

## 2012-10-13 DIAGNOSIS — J309 Allergic rhinitis, unspecified: Secondary | ICD-10-CM | POA: Diagnosis not present

## 2012-10-13 DIAGNOSIS — K219 Gastro-esophageal reflux disease without esophagitis: Secondary | ICD-10-CM | POA: Diagnosis not present

## 2012-11-06 DIAGNOSIS — J309 Allergic rhinitis, unspecified: Secondary | ICD-10-CM | POA: Diagnosis not present

## 2012-11-06 DIAGNOSIS — M1990 Unspecified osteoarthritis, unspecified site: Secondary | ICD-10-CM | POA: Diagnosis not present

## 2012-11-06 DIAGNOSIS — E785 Hyperlipidemia, unspecified: Secondary | ICD-10-CM | POA: Diagnosis not present

## 2012-11-06 DIAGNOSIS — I10 Essential (primary) hypertension: Secondary | ICD-10-CM | POA: Diagnosis not present

## 2012-11-10 DIAGNOSIS — K222 Esophageal obstruction: Secondary | ICD-10-CM | POA: Diagnosis not present

## 2012-11-10 DIAGNOSIS — K259 Gastric ulcer, unspecified as acute or chronic, without hemorrhage or perforation: Secondary | ICD-10-CM

## 2012-11-10 DIAGNOSIS — K922 Gastrointestinal hemorrhage, unspecified: Secondary | ICD-10-CM | POA: Diagnosis not present

## 2012-11-10 DIAGNOSIS — K2970 Gastritis, unspecified, without bleeding: Secondary | ICD-10-CM | POA: Diagnosis not present

## 2012-11-10 DIAGNOSIS — R1310 Dysphagia, unspecified: Secondary | ICD-10-CM | POA: Diagnosis not present

## 2012-11-10 DIAGNOSIS — K449 Diaphragmatic hernia without obstruction or gangrene: Secondary | ICD-10-CM | POA: Diagnosis not present

## 2012-11-10 HISTORY — DX: Gastric ulcer, unspecified as acute or chronic, without hemorrhage or perforation: K25.9

## 2012-11-13 DIAGNOSIS — H43819 Vitreous degeneration, unspecified eye: Secondary | ICD-10-CM | POA: Diagnosis not present

## 2012-11-13 DIAGNOSIS — H43399 Other vitreous opacities, unspecified eye: Secondary | ICD-10-CM | POA: Diagnosis not present

## 2012-11-13 DIAGNOSIS — H35349 Macular cyst, hole, or pseudohole, unspecified eye: Secondary | ICD-10-CM | POA: Diagnosis not present

## 2012-11-22 ENCOUNTER — Other Ambulatory Visit: Payer: Self-pay | Admitting: Family Medicine

## 2012-11-22 NOTE — Telephone Encounter (Signed)
Needs office visit.

## 2012-11-24 ENCOUNTER — Encounter (INDEPENDENT_AMBULATORY_CARE_PROVIDER_SITE_OTHER): Payer: Medicare Other | Admitting: Ophthalmology

## 2012-11-24 DIAGNOSIS — H43819 Vitreous degeneration, unspecified eye: Secondary | ICD-10-CM | POA: Diagnosis not present

## 2012-11-24 DIAGNOSIS — H35039 Hypertensive retinopathy, unspecified eye: Secondary | ICD-10-CM

## 2012-11-24 DIAGNOSIS — H33309 Unspecified retinal break, unspecified eye: Secondary | ICD-10-CM

## 2012-11-24 DIAGNOSIS — I10 Essential (primary) hypertension: Secondary | ICD-10-CM | POA: Diagnosis not present

## 2012-12-08 ENCOUNTER — Ambulatory Visit (INDEPENDENT_AMBULATORY_CARE_PROVIDER_SITE_OTHER): Payer: Medicare Other | Admitting: Ophthalmology

## 2012-12-08 DIAGNOSIS — H33309 Unspecified retinal break, unspecified eye: Secondary | ICD-10-CM

## 2012-12-23 ENCOUNTER — Ambulatory Visit (INDEPENDENT_AMBULATORY_CARE_PROVIDER_SITE_OTHER): Payer: Medicare Other | Admitting: Emergency Medicine

## 2012-12-23 VITALS — BP 114/70 | HR 73 | Temp 97.9°F | Resp 18 | Ht 60.0 in | Wt 120.8 lb

## 2012-12-23 DIAGNOSIS — M940 Chondrocostal junction syndrome [Tietze]: Secondary | ICD-10-CM

## 2012-12-23 DIAGNOSIS — R079 Chest pain, unspecified: Secondary | ICD-10-CM

## 2012-12-23 MED ORDER — TRAMADOL HCL 50 MG PO TABS: 50.0000 mg | ORAL_TABLET | Freq: Three times a day (TID) | ORAL | Status: DC | PRN

## 2012-12-23 NOTE — Progress Notes (Signed)
[facility] [address], [place] [address] [phone]  Date:  12/23/2012   Name:  MARISELA LINE   DOB:  1926/06/01   MRN:  [id number]  PCP:  Minda Meo, MD    Chief Complaint: Arm Pain   History of Present Illness:  JERENE YEAGER is a 79 y.o. very pleasant female patient who presents with the following:  Says she watered her garden with a hose past few days.  Last night was awakened by pain in her left mid chest that is also present in her left arm.  Not associated with exertion, movement, breathing.  No associated shortness of breath, palpitations, nausea, vomiting, dizziness, diaphoresis or other complaint.  Says the discomfort is pressure like and not changed since onset.  Not worse with walking or eating.  History of ulcer disease.  Not taking ASA.  Non smoker.  Has controled HBP and elevated lipids.  Family history is positive.  No improvement with over the counter medications or other home remedies. Denies other complaint or health concern today.   Patient Active Problem List   Diagnosis Date Noted  . Palpitations 07/31/2012  . Dyspnea 04/30/2011  . Chest pain 04/18/2011    Past Medical History  Diagnosis Date  . Hyperlipidemia   . Hypertension   . Osteopenia   . PAC (premature atrial contraction)     Past Surgical History  Procedure Laterality Date  . Back surgery  1978  . Ankle surgery  1987  . Cataracts      History  Substance Use Topics  . Smoking status: Never Smoker   . Smokeless tobacco: Not on file     Comment: 20 yrs ago.  . Alcohol Use: 3.0 oz/week    5 Glasses of wine per week    Family History  Problem Relation Age of Onset  . Coronary artery disease Father   . Heart attack Father   . Heart failure Mother     Allergies  Allergen Reactions  . Codeine   . Penicillins     Medication list has been reviewed and updated.  Current Outpatient Prescriptions on File Prior to Visit  Medication Sig Dispense  Refill  . cholecalciferol (VITAMIN D) 1000 UNITS tablet Take 2,000 Units by mouth daily.       Marland Kitchen NASONEX 50 MCG/ACT nasal spray place 2 sprays INTO THE NOSE daily  17 g  0  . OVER THE COUNTER MEDICATION calicium w/ mag  daily       . rosuvastatin (CRESTOR) 10 MG tablet 10 mg. Take 1/2 tab daily      . triamterene-hydrochlorothiazide (MAXZIDE) 75-50 MG per tablet Take 1 tablet by mouth daily.        . verapamil (CALAN-SR) 240 MG CR tablet Take 240 mg by mouth daily.       Marland Kitchen aspirin 81 MG tablet Take 81 mg by mouth daily.        . butabarbital (BUTISOL SODIUM) 30 MG tablet 1 tab as needed        No current facility-administered medications on file prior to visit.    Review of Systems:  As per HPI, otherwise negative.    Physical Examination: Filed Vitals:   12/23/12 1358  BP: 114/70  Pulse: 73  Temp: 97.9 F (36.6 C)  Resp: 18   Filed Vitals:   12/23/12 1358  Height: 5' (1.524 m)  Weight: 120 lb 12.8 oz (54.795 kg)   Body mass index  is 23.59 kg/(m^2). Ideal Body Weight: Weight in (lb) to have BMI = 25: 127.7  GEN: WDWN, NAD, Non-toxic, A & O x 3 HEENT: Atraumatic, Normocephalic. Neck supple. No masses, No LAD. Ears and Nose: No external deformity. CV: RRR, No M/G/R. No JVD. No thrill. No extra heart sounds. CHEST:  Marked point tenderness reproducing pain on left sternal border at 5-6th ribs. PULM: CTA B, no wheezes, crackles, rhonchi. No retractions. No resp. distress. No accessory muscle use. ABD: S, NT, ND, +BS. No rebound. No HSM. EXTR: No c/c/e NEURO Normal gait.  PSYCH: Normally interactive. Conversant. Not depressed or anxious appearing.  Calm demeanor.    Assessment and Plan:  Risk factors for CAD include HTN, HLD, age, family history  Costochondritis Tramadol   Signed,  Phillips Odor, MD

## 2012-12-23 NOTE — Patient Instructions (Addendum)
Costochondritis Costochondritis (Tietze syndrome), or costochondral separation, is a swelling and irritation (inflammation) of the tissue (cartilage) that connects your ribs with your breastbone (sternum). It may occur on its own (spontaneously), through damage caused by an accident (trauma), or simply from coughing or minor exercise. It may take up to 6 weeks to get better and longer if you are unable to be conservative in your activities. HOME CARE INSTRUCTIONS   Avoid exhausting physical activity. Try not to strain your ribs during normal activity. This would include any activities using chest, belly (abdominal), and side muscles, especially if heavy weights are used.  Use ice for 15-20 minutes per hour while awake for the first 2 days. Place the ice in a plastic bag, and place a towel between the bag of ice and your skin.  Only take over-the-counter or prescription medicines for pain, discomfort, or fever as directed by your caregiver. SEEK IMMEDIATE MEDICAL CARE IF:   Your pain increases or you are very uncomfortable.  You have a fever.  You develop difficulty with your breathing.  You cough up blood.  You develop worse chest pains, shortness of breath, sweating, or vomiting.  You develop new, unexplained problems (symptoms). MAKE SURE YOU:   Understand these instructions.  Will watch your condition.  Will get help right away if you are not doing well or get worse. Document Released: 03/07/2005 Document Revised: 08/20/2011 Document Reviewed: 01/14/2008 Exit[employer] Patient Information 2014 [employer].  

## 2012-12-24 ENCOUNTER — Encounter: Payer: Self-pay | Admitting: Obstetrics & Gynecology

## 2012-12-30 ENCOUNTER — Ambulatory Visit (INDEPENDENT_AMBULATORY_CARE_PROVIDER_SITE_OTHER): Payer: Medicare Other | Admitting: Obstetrics & Gynecology

## 2012-12-30 ENCOUNTER — Encounter: Payer: Self-pay | Admitting: Obstetrics & Gynecology

## 2012-12-30 VITALS — BP 122/68 | HR 64 | Resp 16 | Ht 59.75 in | Wt 119.6 lb

## 2012-12-30 DIAGNOSIS — Z124 Encounter for screening for malignant neoplasm of cervix: Secondary | ICD-10-CM

## 2012-12-30 DIAGNOSIS — Z01419 Encounter for gynecological examination (general) (routine) without abnormal findings: Secondary | ICD-10-CM | POA: Diagnosis not present

## 2012-12-30 NOTE — Progress Notes (Signed)
79 y.o. G2P2 Widowed CaucasianF here for annual exam.  No vaginal bleeding.  Has appointment with Dr. Jacky Kindle every year.  Has appointment later this month.  Hasn't had a "weak spell" in three or four months.  Had cardiac evaluation that was negative.  Did see Dr. Kinnie Scales and had EGD with esophageal stretching.     Patient's last menstrual period was 06/11/1976.          Sexually active: no  The current method of family planning is none.    Exercising: yes   Smoker:  no  Health Maintenance: Pap:  11/08/10 WNL History of abnormal Pap:  no MMG:  06/30/12 normal Colonoscopy:  2/08.  Dr. Kinnie Scales.  6/14 endoscopy with Dr. Kinnie Scales.  Ulcers and esophagus stretched.   BMD:   3/10, -1.5/-2.0 TDaP:  12/08 Screening Labs: PCP, Hb today: PCP, Urine today: PCP   reports that she has never smoked. She has never used smokeless tobacco. She reports that she drinks about 1.8 ounces of alcohol per week. She reports that she does not use illicit drugs.  Past Medical History  Diagnosis Date  . Hyperlipidemia   . Hypertension   . Osteopenia   . PAC (premature atrial contraction)   . Mastodynia   . Ovarian cyst     h/o  . Diverticulitis   . Stomach ulcer 11/10/12    dx with endoscopy, will repeat in 8/14 and will stretch esophagus    Past Surgical History  Procedure Laterality Date  . Back surgery  1978  . Ankle surgery  1987  . Cataracts    . Mouth surgery      implants    Current Outpatient Prescriptions  Medication Sig Dispense Refill  . acetaminophen (TYLENOL) 500 MG tablet Take 500 mg by mouth as needed for pain.      Marland Kitchen ALPRAZolam (XANAX) 0.25 MG tablet Take 0.25 mg by mouth at bedtime as needed for sleep.      Marland Kitchen CALCIUM PO Take 1,000 mg by mouth daily.      . cholecalciferol (VITAMIN D) 1000 UNITS tablet Take 2,000 Units by mouth daily.       . Multiple Vitamins-Minerals (MULTIVITAMIN PO) Take by mouth daily.      Marland Kitchen NASONEX 50 MCG/ACT nasal spray place 2 sprays INTO THE NOSE daily  17 g  0   . omeprazole (PRILOSEC) 40 MG capsule 40 mg.      . rosuvastatin (CRESTOR) 10 MG tablet 10 mg. Take 1/2 tab daily      . triamterene-hydrochlorothiazide (MAXZIDE) 75-50 MG per tablet Take 1 tablet by mouth daily.        . verapamil (CALAN-SR) 240 MG CR tablet Take 240 mg by mouth daily.       Marland Kitchen aspirin 81 MG tablet Take 81 mg by mouth daily.        . traMADol (ULTRAM) 50 MG tablet Take 1 tablet (50 mg total) by mouth every 8 (eight) hours as needed for pain.  30 tablet  0   No current facility-administered medications for this visit.    Family History  Problem Relation Age of Onset  . Coronary artery disease Father   . Heart attack Father   . Heart failure Mother   . Colon cancer Brother     ROS:  Pertinent items are noted in HPI.  Otherwise, a comprehensive ROS was negative.  Exam:   BP 122/68  Pulse 64  Resp 16  Ht 4' 11.75" (1.518 m)  Wt 119 lb 9.6 oz (54.25 kg)  BMI 23.54 kg/m2  LMP 06/11/1976  Weight change: +1  Height: 4' 11.75" (151.8 cm)  Ht Readings from Last 3 Encounters:  12/30/12 4' 11.75" (1.518 m)  12/23/12 5' (1.524 m)  07/31/12 4\' 11"  (1.499 m)    General appearance: alert, cooperative and appears stated age Head: Normocephalic, without obvious abnormality, atraumatic Neck: no adenopathy, supple, symmetrical, trachea midline and thyroid normal to inspection and palpation Lungs: clear to auscultation bilaterally Breasts: normal appearance, no masses or tenderness Heart: regular rate and rhythm Abdomen: soft, non-tender; bowel sounds normal; no masses,  no organomegaly Extremities: extremities normal, atraumatic, no cyanosis or edema Skin: Skin color, texture, turgor normal. No rashes or lesions Lymph nodes: Cervical, supraclavicular, and axillary nodes normal. No abnormal inguinal nodes palpated Neurologic: Grossly normal   Pelvic: External genitalia:  no lesions              Urethra:  normal appearing urethra with no masses, tenderness or lesions               Bartholins and Skenes: normal                 Vagina: normal appearing vagina with normal color and discharge, no lesions              Cervix: no lesions              Pap taken: yes Bimanual Exam:  Uterus:  normal size, contour, position, consistency, mobility, non-tender              Adnexa: normal adnexa and no mass, fullness, tenderness               Rectovaginal: Confirms               Anus:  normal sphincter tone, no lesions  A:  Well Woman with normal exam PMP, No HRT H/o ovarian cysts (last u/s 1/13) htn and elevated lipids  P:   Mammogram 1/14 pap smear this year BMD next year She will see Dr. Kinnie Scales for follow up colonoscopy this fall. Sees Dr. Jacky Kindle, PCP, every 6 months. return annually or prn  An After Visit Summary was printed and given to the patient.

## 2012-12-30 NOTE — Patient Instructions (Signed)

## 2013-01-05 DIAGNOSIS — M1990 Unspecified osteoarthritis, unspecified site: Secondary | ICD-10-CM | POA: Diagnosis not present

## 2013-01-05 DIAGNOSIS — I10 Essential (primary) hypertension: Secondary | ICD-10-CM | POA: Diagnosis not present

## 2013-01-05 DIAGNOSIS — E785 Hyperlipidemia, unspecified: Secondary | ICD-10-CM | POA: Diagnosis not present

## 2013-01-05 DIAGNOSIS — K219 Gastro-esophageal reflux disease without esophagitis: Secondary | ICD-10-CM | POA: Diagnosis not present

## 2013-01-12 DIAGNOSIS — H35349 Macular cyst, hole, or pseudohole, unspecified eye: Secondary | ICD-10-CM | POA: Diagnosis not present

## 2013-01-12 DIAGNOSIS — H04129 Dry eye syndrome of unspecified lacrimal gland: Secondary | ICD-10-CM | POA: Diagnosis not present

## 2013-01-12 DIAGNOSIS — H35039 Hypertensive retinopathy, unspecified eye: Secondary | ICD-10-CM | POA: Diagnosis not present

## 2013-02-02 DIAGNOSIS — Z8711 Personal history of peptic ulcer disease: Secondary | ICD-10-CM | POA: Diagnosis not present

## 2013-02-02 DIAGNOSIS — K259 Gastric ulcer, unspecified as acute or chronic, without hemorrhage or perforation: Secondary | ICD-10-CM | POA: Diagnosis not present

## 2013-02-02 DIAGNOSIS — K219 Gastro-esophageal reflux disease without esophagitis: Secondary | ICD-10-CM | POA: Diagnosis not present

## 2013-02-02 DIAGNOSIS — R1310 Dysphagia, unspecified: Secondary | ICD-10-CM | POA: Diagnosis not present

## 2013-02-11 DIAGNOSIS — D235 Other benign neoplasm of skin of trunk: Secondary | ICD-10-CM | POA: Diagnosis not present

## 2013-02-11 DIAGNOSIS — L819 Disorder of pigmentation, unspecified: Secondary | ICD-10-CM | POA: Diagnosis not present

## 2013-02-11 DIAGNOSIS — D1801 Hemangioma of skin and subcutaneous tissue: Secondary | ICD-10-CM | POA: Diagnosis not present

## 2013-02-18 ENCOUNTER — Other Ambulatory Visit: Payer: Self-pay | Admitting: Physician Assistant

## 2013-03-02 DIAGNOSIS — R109 Unspecified abdominal pain: Secondary | ICD-10-CM | POA: Diagnosis not present

## 2013-03-02 DIAGNOSIS — Z23 Encounter for immunization: Secondary | ICD-10-CM | POA: Diagnosis not present

## 2013-03-02 DIAGNOSIS — IMO0002 Reserved for concepts with insufficient information to code with codable children: Secondary | ICD-10-CM | POA: Diagnosis not present

## 2013-03-02 DIAGNOSIS — M1990 Unspecified osteoarthritis, unspecified site: Secondary | ICD-10-CM | POA: Diagnosis not present

## 2013-03-11 ENCOUNTER — Ambulatory Visit: Payer: Medicare Other | Admitting: Cardiovascular Disease

## 2013-04-15 ENCOUNTER — Ambulatory Visit (INDEPENDENT_AMBULATORY_CARE_PROVIDER_SITE_OTHER): Payer: Medicare Other | Admitting: Ophthalmology

## 2013-04-15 DIAGNOSIS — I10 Essential (primary) hypertension: Secondary | ICD-10-CM

## 2013-04-15 DIAGNOSIS — H43819 Vitreous degeneration, unspecified eye: Secondary | ICD-10-CM | POA: Diagnosis not present

## 2013-04-15 DIAGNOSIS — H33309 Unspecified retinal break, unspecified eye: Secondary | ICD-10-CM | POA: Diagnosis not present

## 2013-04-15 DIAGNOSIS — H35039 Hypertensive retinopathy, unspecified eye: Secondary | ICD-10-CM | POA: Diagnosis not present

## 2013-05-27 ENCOUNTER — Encounter (INDEPENDENT_AMBULATORY_CARE_PROVIDER_SITE_OTHER): Payer: Self-pay

## 2013-05-27 ENCOUNTER — Ambulatory Visit (INDEPENDENT_AMBULATORY_CARE_PROVIDER_SITE_OTHER): Payer: Medicare Other | Admitting: Cardiovascular Disease

## 2013-05-27 VITALS — BP 126/64 | HR 82 | Ht 59.0 in | Wt 118.0 lb

## 2013-05-27 DIAGNOSIS — I491 Atrial premature depolarization: Secondary | ICD-10-CM | POA: Diagnosis not present

## 2013-05-27 DIAGNOSIS — R002 Palpitations: Secondary | ICD-10-CM

## 2013-05-27 NOTE — Patient Instructions (Signed)
Your physician wants you to follow-up in:  12 months.  You will receive a reminder letter in the mail two months in advance. If you don't receive a letter, please call our office to schedule the follow-up appointment.   

## 2013-05-27 NOTE — Progress Notes (Signed)
History of Present Illness: 79 yo female with history of HTN, HLD, former tobacco abuse, GERD here today for cardiac follow up. I saw her for the first time 04/18/11 with c/o weakness and dyspnea. She had no chest discomfort during that episode. She felt weak and sat down. No dizziness. No recent illnesses. She had been bitten by a tick several days before her episode. She did describe frequent episodes of upper epigastric pain with rest and with exertion. I arranged a stress myoview. Given her baseline EKG changes, Lexiscan was used. Her LVEF was 84%. There was no evidence of ischemia. She had carotid dopplers July 2013 in the [facility] office with mild bilateral carotid artery stenosis. I saw her in November 2013 and she had continued complaints of weakness, fatigue and dyspnea which seemed to be associated with palpitations. No chest pain. I arranged a 21 day event monitor which showed PACs but no atrial fib/flutter or SVT.   She is here today for follow up.  She is feeling well. No chest pain or SOB. She has no palpitations. No weak spells.   Primary Care Physician: Dr. Jacky Kindle with [facility]   Last Lipid Profile: Followed in primary care.   Past Medical History  Diagnosis Date  . Hyperlipidemia   . Hypertension   . Osteopenia   . PAC (premature atrial contraction)   . Mastodynia   . Ovarian cyst     h/o  . Diverticulitis   . Stomach ulcer 11/10/12    dx with endoscopy, will repeat in 8/14 and will stretch esophagus    Past Surgical History  Procedure Laterality Date  . Back surgery  1978  . Ankle surgery  1987  . Cataracts    . Mouth surgery      implants    Current Outpatient Prescriptions  Medication Sig Dispense Refill  . acetaminophen (TYLENOL) 500 MG tablet Take 500 mg by mouth as needed for pain.      Marland Kitchen ALPRAZolam (XANAX) 0.25 MG tablet Take 0.25 mg by mouth at bedtime as needed for sleep.      Marland Kitchen CALCIUM PO Take 1,000 mg by mouth daily.      .  cholecalciferol (VITAMIN D) 1000 UNITS tablet Take 2,000 Units by mouth daily.       . mometasone (NASONEX) 50 MCG/ACT nasal spray Place 2 sprays into the nose daily. PATIENT NEEDS OFFICE VISIT FOR ADDITIONAL REFILLS  17 g  0  . Multiple Vitamins-Minerals (MULTIVITAMIN PO) Take by mouth daily.      Marland Kitchen omeprazole (PRILOSEC) 40 MG capsule 40 mg.      . rosuvastatin (CRESTOR) 10 MG tablet 10 mg. Take 1/2 tab daily      . traMADol (ULTRAM) 50 MG tablet Take 1 tablet (50 mg total) by mouth every 8 (eight) hours as needed for pain.  30 tablet  0  . triamterene-hydrochlorothiazide (MAXZIDE) 75-50 MG per tablet Take 1 tablet by mouth daily.        . verapamil (CALAN-SR) 240 MG CR tablet Take 240 mg by mouth daily.        No current facility-administered medications for this visit.    Allergies  Allergen Reactions  . Codeine   . Penicillins     History   Social History  . Marital Status: Single    Spouse Name: N/A    Number of Children: 2  . Years of Education: N/A   Occupational History  . Retired Paramedic work  Social History Main Topics  . Smoking status: Never Smoker   . Smokeless tobacco: Never Used     Comment: 20 yrs ago.  . Alcohol Use: 1.8 oz/week    3 Glasses of wine per week  . Drug Use: No  . Sexual Activity: No   Other Topics Concern  . Not on file   Social History Narrative  . No narrative on file    Family History  Problem Relation Age of Onset  . Coronary artery disease Father   . Heart attack Father   . Heart failure Mother   . Colon cancer Brother     Review of Systems:  As stated in the HPI and otherwise negative.   BP 126/64  Pulse 82  Ht 4\' 11"  (1.499 m)  Wt 118 lb (53.524 kg)  BMI 23.82 kg/m2  LMP 06/11/1976  Physical Examination: General: Well developed, well nourished, NAD HEENT: OP clear, mucus membranes moist SKIN: warm, dry. No rashes. Neuro: No focal deficits Musculoskeletal: Muscle strength 5/5 all ext Psychiatric: Mood and affect  normal Neck: No JVD, no carotid bruits, no thyromegaly, no lymphadenopathy. Lungs:Clear bilaterally, no wheezes, rhonci, crackles Cardiovascular: Regular rate and rhythm. No murmurs, gallops or rubs. Abdomen:Soft. Bowel sounds present. Non-tender.  Extremities: No lower extremity edema. Pulses are 2 + in the bilateral DP/PT.  Assessment and Plan:   1. Palpitations/PACs: She was found to have premature atrial contractions on 21 day event monitor in November 2013. No evidence of atrial fibrillation/flutter or SVT. She is doing well. No palpitations. Will continue verapamil.

## 2013-06-09 ENCOUNTER — Other Ambulatory Visit: Payer: Self-pay

## 2013-06-09 DIAGNOSIS — Z1231 Encounter for screening mammogram for malignant neoplasm of breast: Secondary | ICD-10-CM

## 2013-07-01 DIAGNOSIS — E785 Hyperlipidemia, unspecified: Secondary | ICD-10-CM | POA: Diagnosis not present

## 2013-07-01 DIAGNOSIS — R82998 Other abnormal findings in urine: Secondary | ICD-10-CM | POA: Diagnosis not present

## 2013-07-01 DIAGNOSIS — I10 Essential (primary) hypertension: Secondary | ICD-10-CM | POA: Diagnosis not present

## 2013-07-06 ENCOUNTER — Ambulatory Visit
Admission: RE | Admit: 2013-07-06 | Discharge: 2013-07-06 | Disposition: A | Discharge status: Home / Self Care | Payer: Medicare Other | Source: Ambulatory Visit

## 2013-07-06 DIAGNOSIS — Z1231 Encounter for screening mammogram for malignant neoplasm of breast: Secondary | ICD-10-CM

## 2013-07-08 DIAGNOSIS — I10 Essential (primary) hypertension: Secondary | ICD-10-CM | POA: Diagnosis not present

## 2013-07-08 DIAGNOSIS — M1990 Unspecified osteoarthritis, unspecified site: Secondary | ICD-10-CM | POA: Diagnosis not present

## 2013-07-08 DIAGNOSIS — IMO0002 Reserved for concepts with insufficient information to code with codable children: Secondary | ICD-10-CM | POA: Diagnosis not present

## 2013-07-08 DIAGNOSIS — Z23 Encounter for immunization: Secondary | ICD-10-CM | POA: Diagnosis not present

## 2013-07-08 DIAGNOSIS — E785 Hyperlipidemia, unspecified: Secondary | ICD-10-CM | POA: Diagnosis not present

## 2013-07-08 DIAGNOSIS — J309 Allergic rhinitis, unspecified: Secondary | ICD-10-CM | POA: Diagnosis not present

## 2013-07-08 DIAGNOSIS — Z0000 Encounter for general adult medical examination without abnormal findings: Secondary | ICD-10-CM | POA: Diagnosis not present

## 2013-07-08 DIAGNOSIS — Z1331 Encounter for screening for depression: Secondary | ICD-10-CM | POA: Diagnosis not present

## 2013-07-09 DIAGNOSIS — Z1212 Encounter for screening for malignant neoplasm of rectum: Secondary | ICD-10-CM | POA: Diagnosis not present

## 2013-08-13 DIAGNOSIS — R51 Headache: Secondary | ICD-10-CM | POA: Diagnosis not present

## 2013-08-13 DIAGNOSIS — H905 Unspecified sensorineural hearing loss: Secondary | ICD-10-CM | POA: Diagnosis not present

## 2013-08-13 DIAGNOSIS — H9110 Presbycusis, unspecified ear: Secondary | ICD-10-CM | POA: Diagnosis not present

## 2013-08-13 DIAGNOSIS — H9190 Unspecified hearing loss, unspecified ear: Secondary | ICD-10-CM | POA: Diagnosis not present

## 2013-08-13 DIAGNOSIS — M2669 Other specified disorders of temporomandibular joint: Secondary | ICD-10-CM | POA: Diagnosis not present

## 2013-09-02 ENCOUNTER — Encounter (HOSPITAL_COMMUNITY): Payer: Self-pay | Admitting: Emergency Medicine

## 2013-09-02 ENCOUNTER — Observation Stay (HOSPITAL_COMMUNITY)
Admission: EM | Admit: 2013-09-02 | Discharge: 2013-09-03 | Disposition: A | Discharge status: Home / Self Care | Payer: Medicare Other | Attending: Internal Medicine | Admitting: Internal Medicine

## 2013-09-02 ENCOUNTER — Emergency Department (HOSPITAL_COMMUNITY): Payer: Medicare Other

## 2013-09-02 DIAGNOSIS — E785 Hyperlipidemia, unspecified: Secondary | ICD-10-CM | POA: Diagnosis not present

## 2013-09-02 DIAGNOSIS — R42 Dizziness and giddiness: Secondary | ICD-10-CM | POA: Insufficient documentation

## 2013-09-02 DIAGNOSIS — R110 Nausea: Secondary | ICD-10-CM | POA: Diagnosis not present

## 2013-09-02 DIAGNOSIS — G319 Degenerative disease of nervous system, unspecified: Secondary | ICD-10-CM | POA: Diagnosis not present

## 2013-09-02 DIAGNOSIS — R55 Syncope and collapse: Principal | ICD-10-CM | POA: Insufficient documentation

## 2013-09-02 DIAGNOSIS — Z8711 Personal history of peptic ulcer disease: Secondary | ICD-10-CM | POA: Insufficient documentation

## 2013-09-02 DIAGNOSIS — I10 Essential (primary) hypertension: Secondary | ICD-10-CM | POA: Insufficient documentation

## 2013-09-02 DIAGNOSIS — I6529 Occlusion and stenosis of unspecified carotid artery: Secondary | ICD-10-CM | POA: Diagnosis not present

## 2013-09-02 DIAGNOSIS — R5383 Other fatigue: Secondary | ICD-10-CM | POA: Diagnosis not present

## 2013-09-02 DIAGNOSIS — M949 Disorder of cartilage, unspecified: Secondary | ICD-10-CM | POA: Diagnosis not present

## 2013-09-02 DIAGNOSIS — M899 Disorder of bone, unspecified: Secondary | ICD-10-CM | POA: Insufficient documentation

## 2013-09-02 DIAGNOSIS — R5381 Other malaise: Secondary | ICD-10-CM | POA: Insufficient documentation

## 2013-09-02 DIAGNOSIS — I700 Atherosclerosis of aorta: Secondary | ICD-10-CM | POA: Insufficient documentation

## 2013-09-02 DIAGNOSIS — Z79899 Other long term (current) drug therapy: Secondary | ICD-10-CM | POA: Insufficient documentation

## 2013-09-02 LAB — I-STAT TROPONIN, ED
Troponin i, poc: 0 ng/mL (ref 0.00–0.08)
Troponin i, poc: 0.01 ng/mL (ref 0.00–0.08)

## 2013-09-02 LAB — URINALYSIS, ROUTINE W REFLEX MICROSCOPIC
BILIRUBIN URINE: NEGATIVE
Glucose, UA: NEGATIVE mg/dL
Hgb urine dipstick: NEGATIVE
KETONES UR: NEGATIVE mg/dL
Leukocytes, UA: NEGATIVE
NITRITE: NEGATIVE
Protein, ur: NEGATIVE mg/dL
SPECIFIC GRAVITY, URINE: 1.013 (ref 1.005–1.030)
UROBILINOGEN UA: 0.2 mg/dL (ref 0.0–1.0)
pH: 7.5 (ref 5.0–8.0)

## 2013-09-02 LAB — CBC WITH DIFFERENTIAL/PLATELET
Basophils Absolute: 0 10*3/uL (ref 0.0–0.1)
Basophils Relative: 0 % (ref 0–1)
EOS ABS: 0 10*3/uL (ref 0.0–0.7)
EOS PCT: 0 % (ref 0–5)
HEMATOCRIT: 41 % (ref 36.0–46.0)
HEMOGLOBIN: 13.9 g/dL (ref 12.0–15.0)
LYMPHS PCT: 8 % — AB (ref 12–46)
Lymphs Abs: 1 10*3/uL (ref 0.7–4.0)
MCH: 30.8 pg (ref 26.0–34.0)
MCHC: 33.9 g/dL (ref 30.0–36.0)
MCV: 90.9 fL (ref 78.0–100.0)
MONOS PCT: 6 % (ref 3–12)
Monocytes Absolute: 0.7 10*3/uL (ref 0.1–1.0)
Neutro Abs: 10.9 10*3/uL — ABNORMAL HIGH (ref 1.7–7.7)
Neutrophils Relative %: 86 % — ABNORMAL HIGH (ref 43–77)
Platelets: 345 10*3/uL (ref 150–400)
RBC: 4.51 MIL/uL (ref 3.87–5.11)
RDW: 12.1 % (ref 11.5–15.5)
WBC: 12.7 10*3/uL — AB (ref 4.0–10.5)

## 2013-09-02 LAB — COMPREHENSIVE METABOLIC PANEL
ALK PHOS: 112 U/L (ref 39–117)
ALT: 15 U/L (ref 0–35)
AST: 26 U/L (ref 0–37)
Albumin: 3.9 g/dL (ref 3.5–5.2)
BUN: 16 mg/dL (ref 6–23)
CALCIUM: 9.7 mg/dL (ref 8.4–10.5)
CO2: 25 meq/L (ref 19–32)
Chloride: 93 mEq/L — ABNORMAL LOW (ref 96–112)
Creatinine, Ser: 0.86 mg/dL (ref 0.50–1.10)
GFR, EST AFRICAN AMERICAN: 69 mL/min — AB (ref >90)
GFR, EST NON AFRICAN AMERICAN: 59 mL/min — AB (ref >90)
GLUCOSE: 125 mg/dL — AB (ref 70–99)
Potassium: 3.9 mEq/L (ref 3.7–5.3)
Sodium: 132 mEq/L — ABNORMAL LOW (ref 137–147)
Total Bilirubin: 0.7 mg/dL (ref 0.3–1.2)
Total Protein: 7.1 g/dL (ref 6.0–8.3)

## 2013-09-02 LAB — LIPASE, BLOOD: LIPASE: 35 U/L (ref 11–59)

## 2013-09-02 MED ORDER — SODIUM CHLORIDE 0.9 % IV BOLUS (SEPSIS)
1000.0000 mL | Freq: Once | INTRAVENOUS | Status: AC
  Administered 2013-09-02: 1000 mL via INTRAVENOUS

## 2013-09-02 MED ORDER — ONDANSETRON HCL 4 MG/2ML IJ SOLN
4.0000 mg | Freq: Once | INTRAMUSCULAR | Status: DC
  Filled 2013-09-02: qty 2

## 2013-09-02 NOTE — ED Provider Notes (Signed)
CSN: [id number]     Arrival date & time 09/02/13  1606 History   First MD Initiated Contact with Patient 09/02/13 2014/09/14     Chief Complaint  Patient presents with  . Dizziness  . Nausea  . Near Syncope     (Consider location/radiation/quality/duration/timing/severity/associated sxs/prior Treatment) The history is provided by the patient. No language interpreter was used.  Lori Boone is an 80 y/o F with PMHx HLD, HTN, OA, diverticulitis, stomach ulcers, chest palpitation diagnosed with PACs and followed by Dr. Angelena Form who placed patient on verapimil presenting to the ED with nausea, dizziness, and near syncopal episode. Patient reported that on Monday she had a really bad headache. Stated that this morning when she woke up she felt nauseous and figured that this was aggravation of her GERD. Reported that she worked out this morning and stated that after working out she felt dizzy and nauseous. Reported that she went to the bathroom to have an episode of emesis and became extremely dizzy to the point that it brought her down to her knees and she fell. Stated that she had a near syncopal episode - stated that she does not think she hit her head or have LOC. Reported that she had episode of dizzy spells last year, but reported that she has not had these in a long time. Reported that she has been having mild abdominal pain, but reported that this is normal for her and that she has history of ulcers and diverticulosis/diverticulitis. Stated that she has been experiencing left arm discomfort that started after the episode of near syncope. Denied head injury, LOC, vomiting, chest pain, jaw pain, shortness of breath, difficulty breathing, fever, chills, blurred vision, sudden loss of vision, numbness, tingling, weakness. PCP Dr. Reynaldo Minium  Past Medical History  Diagnosis Date  . Hyperlipidemia   . Hypertension   . Osteopenia   . PAC (premature atrial contraction)   . Mastodynia   . Ovarian cyst      h/o  . Diverticulitis   . Stomach ulcer 11/10/12    dx with endoscopy, will repeat in 8/14 and will stretch esophagus   Past Surgical History  Procedure Laterality Date  . Back surgery  1978  . Ankle surgery  1987  . Cataracts    . Mouth surgery      implants   Family History  Problem Relation Age of Onset  . Coronary artery disease Father   . Heart attack Father   . Heart failure Mother   . Colon cancer Brother    History  Substance Use Topics  . Smoking status: Never Smoker   . Smokeless tobacco: Never Used     Comment: 20 yrs ago.  . Alcohol Use: 1.8 oz/week    3 Glasses of wine per week   OB History   Grav Para Term Preterm Abortions TAB SAB Ect Mult Living   2 2        2      Obstetric Comments   One child is deceased September 13, 2005     Review of Systems  Constitutional: Negative for fever and chills.  Respiratory: Negative for chest tightness and shortness of breath.   Cardiovascular: Negative for chest pain.  Gastrointestinal: Positive for nausea and vomiting. Negative for abdominal pain and diarrhea.  Musculoskeletal: Negative for back pain and neck pain.  Neurological: Positive for dizziness, syncope and weakness.  All other systems reviewed and are negative.      Allergies  Codeine and Penicillins  Home Medications   Current Outpatient Rx  Name  Route  Sig  Dispense  Refill  . acetaminophen (TYLENOL) 500 MG tablet   Oral   Take 500 mg by mouth as needed for pain.         Marland Kitchen ALPRAZolam (XANAX) 0.25 MG tablet   Oral   Take 0.25 mg by mouth at bedtime as needed for sleep.         . calcium-vitamin D (OSCAL WITH D) 500-200 MG-UNIT per tablet   Oral   Take 1 tablet by mouth 2 (two) times daily.         . cholecalciferol (VITAMIN D) 1000 UNITS tablet   Oral   Take 2,000 Units by mouth daily.          . magnesium gluconate (MAGONATE) 500 MG tablet   Oral   Take 500 mg by mouth 2 (two) times daily.         . Multiple Vitamins-Minerals  (MULTIVITAMIN PO)   Oral   Take by mouth daily.         Marland Kitchen omeprazole (PRILOSEC) 40 MG capsule   Oral   Take 40 mg by mouth every morning.          . rosuvastatin (CRESTOR) 10 MG tablet      10 mg. Take 1/2 tab daily         . triamterene-hydrochlorothiazide (MAXZIDE) 75-50 MG per tablet   Oral   Take 1 tablet by mouth daily.           . verapamil (CALAN-SR) 240 MG CR tablet   Oral   Take 240 mg by mouth daily.           BP 143/60  Pulse 78  Temp(Src) 97.8 F (36.6 C) (Oral)  Resp 13  SpO2 95%  LMP 06/11/1976 Physical Exam  Nursing note and vitals reviewed. Constitutional: She is oriented to person, place, and time. She appears well-developed and well-nourished. No distress.  HENT:  Head: Normocephalic and atraumatic.  Mouth/Throat: Oropharynx is clear and moist. No oropharyngeal exudate.  Eyes: Conjunctivae and EOM are normal. Pupils are equal, round, and reactive to light. Right eye exhibits no discharge. Left eye exhibits no discharge.  Negative nystagmus Visual fields grossly intact  Neck: Normal range of motion. Neck supple. No tracheal deviation present.  Negative neck stiffness Negative nuchal rigidity Negative cervical LAD  Cardiovascular: Normal rate, regular rhythm and normal heart sounds.  Exam reveals no friction rub.   No murmur heard. Cap refill < 3 seconds Negative swelling or pitting edema noted to the lower extremities bialetrally  Pulmonary/Chest: Effort normal and breath sounds normal. No respiratory distress. She has no wheezes. She has no rales.  Abdominal: Soft. Bowel sounds are normal. There is no tenderness. There is no guarding.  Musculoskeletal: Normal range of motion.  Full ROM to upper and lower extremities without difficulty noted, negative ataxia noted.  Lymphadenopathy:    She has no cervical adenopathy.  Neurological: She is alert and oriented to person, place, and time. No cranial nerve deficit. She exhibits normal muscle  tone. Coordination normal.  Cranial nerves III-XII grossly intact Strength 5+/5+ to upper and lower extremities bilaterally with resistance applied, equal distribution noted Equal grip strength  Sensation intact  Negative arm drift Fine motor skills intact Heel to knee down shin normal bilaterally Gait proper, proper balance - negative sway, negative drift, negative step-offs  Skin: Skin is warm and dry. No rash noted. She is  not diaphoretic. No erythema.  Psychiatric: She has a normal mood and affect. Her behavior is normal. Thought content normal.    ED Course  Procedures (including critical care time)  2:46 AM This provider spoke with Dr. Dagmar Hait, [facility] - discussed case, history, presentation, labs, imaging in great detail. Patient to be admitted to Telemetry observations with temporary orders placed. Dr. Dagmar Hait reported that patient will be seen by Dr. Reynaldo Minium in the morning, around 7:00AM to finish the admission/paperwork.   Results for orders placed during the hospital encounter of 09/02/13  CBC WITH DIFFERENTIAL      Result Value Ref Range   WBC 12.7 (*) 4.0 - 10.5 K/uL   RBC 4.51  3.87 - 5.11 MIL/uL   Hemoglobin 13.9  12.0 - 15.0 g/dL   HCT 41.0  36.0 - 46.0 %   MCV 90.9  78.0 - 100.0 fL   MCH 30.8  26.0 - 34.0 pg   MCHC 33.9  30.0 - 36.0 g/dL   RDW 12.1  11.5 - 15.5 %   Platelets 345  150 - 400 K/uL   Neutrophils Relative % 86 (*) 43 - 77 %   Neutro Abs 10.9 (*) 1.7 - 7.7 K/uL   Lymphocytes Relative 8 (*) 12 - 46 %   Lymphs Abs 1.0  0.7 - 4.0 K/uL   Monocytes Relative 6  3 - 12 %   Monocytes Absolute 0.7  0.1 - 1.0 K/uL   Eosinophils Relative 0  0 - 5 %   Eosinophils Absolute 0.0  0.0 - 0.7 K/uL   Basophils Relative 0  0 - 1 %   Basophils Absolute 0.0  0.0 - 0.1 K/uL  COMPREHENSIVE METABOLIC PANEL      Result Value Ref Range   Sodium 132 (*) 137 - 147 mEq/L   Potassium 3.9  3.7 - 5.3 mEq/L   Chloride 93 (*) 96 - 112 mEq/L   CO2 25  19 - 32 mEq/L    Glucose, Bld 125 (*) 70 - 99 mg/dL   BUN 16  6 - 23 mg/dL   Creatinine, Ser 0.86  0.50 - 1.10 mg/dL   Calcium 9.7  8.4 - 10.5 mg/dL   Total Protein 7.1  6.0 - 8.3 g/dL   Albumin 3.9  3.5 - 5.2 g/dL   AST 26  0 - 37 U/L   ALT 15  0 - 35 U/L   Alkaline Phosphatase 112  39 - 117 U/L   Total Bilirubin 0.7  0.3 - 1.2 mg/dL   GFR calc non Af Amer 59 (*) >90 mL/min   GFR calc Af Amer 69 (*) >90 mL/min  URINALYSIS, ROUTINE W REFLEX MICROSCOPIC      Result Value Ref Range   Color, Urine YELLOW  YELLOW   APPearance CLEAR  CLEAR   Specific Gravity, Urine 1.013  1.005 - 1.030   pH 7.5  5.0 - 8.0   Glucose, UA NEGATIVE  NEGATIVE mg/dL   Hgb urine dipstick NEGATIVE  NEGATIVE   Bilirubin Urine NEGATIVE  NEGATIVE   Ketones, ur NEGATIVE  NEGATIVE mg/dL   Protein, ur NEGATIVE  NEGATIVE mg/dL   Urobilinogen, UA 0.2  0.0 - 1.0 mg/dL   Nitrite NEGATIVE  NEGATIVE   Leukocytes, UA NEGATIVE  NEGATIVE  LIPASE, BLOOD      Result Value Ref Range   Lipase 35  11 - 59 U/L  I-STAT TROPOININ, ED      Result Value Ref Range  Troponin i, poc 0.00  0.00 - 0.08 ng/mL   Comment 3           I-STAT TROPOININ, ED      Result Value Ref Range   Troponin i, poc 0.01  0.00 - 0.08 ng/mL   Comment 3             Labs Review Labs Reviewed  CBC WITH DIFFERENTIAL - Abnormal; Notable for the following:    WBC 12.7 (*)    Neutrophils Relative % 86 (*)    Neutro Abs 10.9 (*)    Lymphocytes Relative 8 (*)    All other components within normal limits  COMPREHENSIVE METABOLIC PANEL - Abnormal; Notable for the following:    Sodium 132 (*)    Chloride 93 (*)    Glucose, Bld 125 (*)    GFR calc non Af Amer 59 (*)    GFR calc Af Amer 69 (*)    All other components within normal limits  URINALYSIS, ROUTINE W REFLEX MICROSCOPIC  LIPASE, BLOOD  I-STAT TROPOININ, ED  Randolm Idol, ED   Imaging Review Dg Chest 2 View  09/02/2013   CLINICAL DATA:  Nausea and dizziness.  Near syncope.  EXAM: CHEST  2 VIEW   COMPARISON:  None.  FINDINGS: The cardiac silhouette is within normal limits for size. Prominent thoracic aortic calcification is present. The lungs are mildly hyperinflated. Minimal linear opacity is present in the left lung base. The lungs are otherwise clear. No pleural effusion or pneumothorax is seen. No acute osseous abnormality is identified.  IMPRESSION: Minimal scarring or atelectasis in the left lung base. Mild hyperinflation.   Electronically Signed   By: [name]   On: 09/02/2013 22:27   Ct Head Wo Contrast  09/03/2013   CLINICAL DATA:  Near syncope  EXAM: CT HEAD WITHOUT CONTRAST  TECHNIQUE: Contiguous axial images were obtained from the base of the skull through the vertex without intravenous contrast.  COMPARISON:  Prior CT from 10/06/2011  FINDINGS: Mild atrophy with chronic microvascular ischemic changes are present. Atherosclerotic calcifications present within the carotid siphon once.  No mass lesion or midline shift. Gray-white matter differentiation is well maintained. Ventricles are normal in size without evidence of hydrocephalus. CSF containing spaces are within normal limits. No extra-axial fluid collection.  The calvarium is intact.  Orbital soft tissues are within normal limits.  The paranasal sinuses and mastoid air cells are well pneumatized and free of fluid.  Scalp soft tissues are unremarkable.  IMPRESSION: 1. No acute intracranial abnormality. 2. Mild atrophy with moderate chronic microvascular ischemic changes.   Electronically Signed   By: Jeannine Boga M.D.   On: 09/03/2013 01:08     EKG Interpretation None      Date: 09/03/2013  Rate: 68  Rhythm: normal sinus rhythm  QRS Axis: normal  Intervals: normal  ST/T Wave abnormalities: normal  Conduction Disutrbances:none  Narrative Interpretation:   Old EKG Reviewed: unchanged EKG analyzed and reviewed by this provider and attending physician.     MDM   Final diagnoses:  Near syncope  Dizziness    Medications  ondansetron (ZOFRAN) injection 4 mg (0 mg Intravenous Hold 09/02/13 2251)  0.9 %  sodium chloride infusion ( Intravenous New Bag/Given 09/03/13 0257)  sodium chloride 0.9 % bolus 1,000 mL (0 mLs Intravenous Stopped 09/03/13 0150)   Filed Vitals:   09/02/13 2116 09/02/13 2118 09/02/13 2120 09/03/13 0117  BP: 135/56 151/72 149/62 143/60  Pulse: 89 85 88  78  Temp:      TempSrc:      Resp:    13  SpO2:    95%    Patient presenting to the ED with a near syncopal episode that started this afternoon around 12:30 PM. Patient reported that she has not been feeling that great and reported that she has been feeling nauseous this morning. Reported that she became extremely nauseous this afternoon, reported that she ran towards the bathroom, stated when she looked into the bathroom she became dizzy and had a near syncopal episode-patient reported that she did not have any loss of consciousness, stated that she did not hit her head, she does not believe. Patient reported that she's history of dizzy spells last year, has not experienced any dizzy spells long time. Alert and oriented. GCS 15. Heart rate and rhythm normal. Lungs clear to auscultation to upper and lower lobes bilaterally. Radial and DP pulses 2+ bilaterally. Good lung expansion. Bowel sounds normoactive in all 4 quadrants-negative pain upon palpation-benign abdominal exam. Full range of motion to upper and lower extremities bilaterally without difficulty noted. Strength intact with equal distribution. Full range of motion with negative signs ataxia. Negative facial drooping. Negative slurred speech. Visual fields grossly intact-negative nystagmus. Proper gait with-negative sway or step-offs. Equal grip strength. EKG noted normal sinus rhythm with a heart rate of 68 beats per minute th. First troponin negative elevation. Second troponin negative elevation. CBC noted mild elevated white cell count of 12.7 with-negative left shift or  leukocytosis. CMP negative findings-mildly hyponatremia with a sodium level of 132. CT head negative for acute intracranial abnormalities-mild atrophy with moderate chronic microvascular ischemic changes identified. Chest x-ray noted mild hyperinflation with minimal scarring or atelectasis to the left lung base. Negative findings on orthostatic vitals.  Doubt posterior vertebral infarct. Doubt ICH. Doubt SAH. Patient presenting to the ED with near syncopal episode and continuous dizziness since the event. Patient has history of dizzy spells, but has not had them for a long time. Patient appears well. Discussed case with attending physician who agrees to plan of admission for observation. This provider spoke with [facility] on call physician - patient admitted to Telemetry observation. Dr. Reynaldo Minium to see patient in the morning around 7:00AM. Patient agreed to plan of admission and understood. Patient stable for transfer.     Jamse Mead, PA-C 09/03/13 1427

## 2013-09-02 NOTE — ED Notes (Signed)
Pt states around noon/1300 she became nauseous ate crackers and ginger ale and felt like she had to vomit. Pt never vomited when she got to restroom she got weak and fell on floor. No LOC.

## 2013-09-03 ENCOUNTER — Observation Stay (HOSPITAL_COMMUNITY): Payer: Medicare Other

## 2013-09-03 ENCOUNTER — Emergency Department (HOSPITAL_COMMUNITY): Payer: Medicare Other

## 2013-09-03 ENCOUNTER — Encounter (HOSPITAL_COMMUNITY): Payer: Self-pay

## 2013-09-03 DIAGNOSIS — R55 Syncope and collapse: Secondary | ICD-10-CM | POA: Diagnosis present

## 2013-09-03 DIAGNOSIS — E785 Hyperlipidemia, unspecified: Secondary | ICD-10-CM | POA: Diagnosis not present

## 2013-09-03 DIAGNOSIS — I10 Essential (primary) hypertension: Secondary | ICD-10-CM | POA: Diagnosis not present

## 2013-09-03 DIAGNOSIS — I519 Heart disease, unspecified: Secondary | ICD-10-CM | POA: Diagnosis not present

## 2013-09-03 LAB — CBC
HCT: 40.5 % (ref 36.0–46.0)
HEMOGLOBIN: 13.9 g/dL (ref 12.0–15.0)
MCH: 31.4 pg (ref 26.0–34.0)
MCHC: 34.3 g/dL (ref 30.0–36.0)
MCV: 91.4 fL (ref 78.0–100.0)
Platelets: 304 10*3/uL (ref 150–400)
RBC: 4.43 MIL/uL (ref 3.87–5.11)
RDW: 12.4 % (ref 11.5–15.5)
WBC: 6.5 10*3/uL (ref 4.0–10.5)

## 2013-09-03 LAB — CREATININE, SERUM
Creatinine, Ser: 0.75 mg/dL (ref 0.50–1.10)
GFR calc Af Amer: 86 mL/min — ABNORMAL LOW (ref >90)
GFR calc non Af Amer: 75 mL/min — ABNORMAL LOW (ref >90)

## 2013-09-03 MED ORDER — ACETAMINOPHEN 325 MG PO TABS
650.0000 mg | ORAL_TABLET | Freq: Once | ORAL | Status: AC
  Administered 2013-09-03: 650 mg via ORAL
  Filled 2013-09-03: qty 2

## 2013-09-03 MED ORDER — ENOXAPARIN SODIUM 40 MG/0.4ML ~~LOC~~ SOLN
40.0000 mg | SUBCUTANEOUS | Status: DC
  Filled 2013-09-03: qty 0.4

## 2013-09-03 MED ORDER — CALCIUM CARBONATE-VITAMIN D 500-200 MG-UNIT PO TABS
1.0000 | ORAL_TABLET | Freq: Two times a day (BID) | ORAL | Status: DC
  Filled 2013-09-03: qty 1

## 2013-09-03 MED ORDER — SODIUM CHLORIDE 0.9 % IV SOLN
INTRAVENOUS | Status: DC
  Administered 2013-09-03: 03:00:00 via INTRAVENOUS

## 2013-09-03 MED ORDER — TRIAMTERENE-HCTZ 75-50 MG PO TABS
1.0000 | ORAL_TABLET | Freq: Every day | ORAL | Status: DC
  Filled 2013-09-03: qty 1

## 2013-09-03 MED ORDER — PANTOPRAZOLE SODIUM 40 MG PO TBEC: 40.0000 mg | DELAYED_RELEASE_TABLET | Freq: Every day | ORAL | Status: DC

## 2013-09-03 MED ORDER — SODIUM CHLORIDE 0.9 % IV BOLUS (SEPSIS): 1000.0000 mL | Freq: Once | INTRAVENOUS | Status: DC

## 2013-09-03 MED ORDER — VERAPAMIL HCL ER 240 MG PO TBCR
240.0000 mg | EXTENDED_RELEASE_TABLET | Freq: Every day | ORAL | Status: DC
  Filled 2013-09-03: qty 1

## 2013-09-03 MED ORDER — MAGNESIUM GLUCONATE 500 MG PO TABS
500.0000 mg | ORAL_TABLET | Freq: Two times a day (BID) | ORAL | Status: DC
  Filled 2013-09-03: qty 1

## 2013-09-03 MED ORDER — VITAMIN D3 25 MCG (1000 UNIT) PO TABS
2000.0000 [IU] | ORAL_TABLET | Freq: Every day | ORAL | Status: DC
  Filled 2013-09-03: qty 2

## 2013-09-03 MED ORDER — ALPRAZOLAM 0.25 MG PO TABS: 0.2500 mg | ORAL_TABLET | Freq: Every evening | ORAL | Status: DC | PRN

## 2013-09-03 MED ORDER — ATORVASTATIN CALCIUM 10 MG PO TABS
10.0000 mg | ORAL_TABLET | Freq: Every day | ORAL | Status: DC
  Filled 2013-09-03: qty 1

## 2013-09-03 NOTE — H&P (Signed)
PCP:   Geoffery Lyons, MD   Chief Complaint:  Near syncope  HPI: Lori Boone is a patient well-known to me with hyperlipidemia and essential hypertension presenting at this time after an episode of near syncope.  She has had some vague GI symptoms include some nausea but no chest pain or shortness of breath.  In recent days she's also had some vague headaches and some positional dizziness.  Evidently beginning around noon yesterday started having an increase in her positional dizziness accompanied by some mild epigastric queasiness.  Ultimately upon changing position quickly she wound up coming down to her knees but no frank loss of consciousness.  She had no fever chills or sweats.  She's had no chest pain or shortness of breath.  He denies any abdominal pain and bowel habits have been normal.  As result of living alone was felt that she needed to be admitted overnight for observation  Past Medical History: Past Medical History  Diagnosis Date  . Hyperlipidemia   . Hypertension   . Osteopenia   . PAC (premature atrial contraction)   . Mastodynia   . Ovarian cyst     h/o  . Diverticulitis   . Stomach ulcer 11/10/12    dx with endoscopy, will repeat in 8/14 and will stretch esophagus   Past Surgical History  Procedure Laterality Date  . Back surgery  1978  . Ankle surgery  1987  . Cataracts    . Mouth surgery      implants    Medications: Prior to Admission medications   Medication Sig Start Date End Date Taking? Authorizing Provider  acetaminophen (TYLENOL) 500 MG tablet Take 500 mg by mouth as needed for pain.   Yes Historical Provider, MD  ALPRAZolam Duanne Moron) 0.25 MG tablet Take 0.25 mg by mouth at bedtime as needed for sleep.   Yes Historical Provider, MD  calcium-vitamin D (OSCAL WITH D) 500-200 MG-UNIT per tablet Take 1 tablet by mouth 2 (two) times daily.   Yes Historical Provider, MD  cholecalciferol (VITAMIN D) 1000 UNITS tablet Take 2,000 Units by mouth daily.    Yes  Historical Provider, MD  magnesium gluconate (MAGONATE) 500 MG tablet Take 500 mg by mouth 2 (two) times daily.   Yes Historical Provider, MD  Multiple Vitamins-Minerals (MULTIVITAMIN PO) Take by mouth daily.   Yes Historical Provider, MD  omeprazole (PRILOSEC) 40 MG capsule Take 40 mg by mouth every morning.  12/08/12  Yes Historical Provider, MD  rosuvastatin (CRESTOR) 10 MG tablet 10 mg. Take 1/2 tab daily   Yes Historical Provider, MD  triamterene-hydrochlorothiazide (MAXZIDE) 75-50 MG per tablet Take 1 tablet by mouth daily.     Yes Historical Provider, MD  verapamil (CALAN-SR) 240 MG CR tablet Take 240 mg by mouth daily.    Yes Historical Provider, MD    Allergies:   Allergies  Allergen Reactions  . Codeine Nausea Only  . Penicillins     unknown    Social History:  reports that she has never smoked. She has never used smokeless tobacco. She reports that she drinks about 1.8 ounces of alcohol per week. She reports that she does not use illicit drugs.  Family History: Family History  Problem Relation Age of Onset  . Coronary artery disease Father   . Heart attack Father   . Heart failure Mother   . Colon cancer Brother     Physical Exam: Filed Vitals:   09/03/13 0117 09/03/13 0315 09/03/13 0358 09/03/13 1345  BP:  143/60  103/49 107/50  Pulse: 78 75 77 90  Temp:   98.1 F (36.7 C) 98.6 F (37 C)  TempSrc:   Oral Oral  Resp: 13 14 16 16   Height:   4\' 11"  (1.499 m)   Weight:   54.613 kg (120 lb 6.4 oz)   SpO2: 95% 90% 97% 100%   General appearance: alert and cooperative Head: Normocephalic, without obvious abnormality, atraumatic Eyes: conjunctivae/corneas clear. PERRL, EOM's intact.  Nose: Nares normal. Septum midline. Mucosa normal. No drainage or sinus tenderness. Throat: lips, mucosa, and tongue normal; teeth and gums normal Neck: no adenopathy, no carotid bruit, no JVD and thyroid not enlarged, symmetric, no tenderness/mass/nodules Resp: clear to auscultation  bilaterally Cardio: regular rate and rhythm, S1, S2 normal, no murmur, click, rub or gallop GI: soft, non-tender; bowel sounds normal; no masses,  no organomegaly Extremities: extremities normal, atraumatic, no cyanosis or edema Pulses: 2+ and symmetric Lymph nodes: Cervical adenopathy: no cervical lymphadenopathy Neurologic: Alert and oriented X 3, normal strength and tone. Normal symmetric reflexes.     Labs on Admission:   Recent Labs  09/02/13 1700  NA 132*  K 3.9  CL 93*  CO2 25  GLUCOSE 125*  BUN 16  CREATININE 0.86  CALCIUM 9.7    Recent Labs  09/02/13 1700  AST 26  ALT 15  ALKPHOS 112  BILITOT 0.7  PROT 7.1  ALBUMIN 3.9    Recent Labs  09/02/13 1700  LIPASE 35    Recent Labs  09/02/13 1700  WBC 12.7*  NEUTROABS 10.9*  HGB 13.9  HCT 41.0  MCV 90.9  PLT 345   No results found for this basename: CKTOTAL, CKMB, CKMBINDEX, TROPONINI,  in the last 72 hours No results found for this basename: TSH, T4TOTAL, FREET3, T3FREE, THYROIDAB,  in the last 72 hours No results found for this basename: VITAMINB12, FOLATE, FERRITIN, TIBC, IRON, RETICCTPCT,  in the last 72 hours  Radiological Exams on Admission: Dg Chest 2 View  09/02/2013   CLINICAL DATA:  Nausea and dizziness.  Near syncope.  EXAM: CHEST  2 VIEW  COMPARISON:  None.  FINDINGS: The cardiac silhouette is within normal limits for size. Prominent thoracic aortic calcification is present. The lungs are mildly hyperinflated. Minimal linear opacity is present in the left lung base. The lungs are otherwise clear. No pleural effusion or pneumothorax is seen. No acute osseous abnormality is identified.  IMPRESSION: Minimal scarring or atelectasis in the left lung base. Mild hyperinflation.   Electronically Signed   By: [name]   On: 09/02/2013 22:27   Ct Head Wo Contrast  09/03/2013   CLINICAL DATA:  Near syncope  EXAM: CT HEAD WITHOUT CONTRAST  TECHNIQUE: Contiguous axial images were obtained from the base  of the skull through the vertex without intravenous contrast.  COMPARISON:  Prior CT from 10/06/2011  FINDINGS: Mild atrophy with chronic microvascular ischemic changes are present. Atherosclerotic calcifications present within the carotid siphon once.  No mass lesion or midline shift. Gray-white matter differentiation is well maintained. Ventricles are normal in size without evidence of hydrocephalus. CSF containing spaces are within normal limits. No extra-axial fluid collection.  The calvarium is intact.  Orbital soft tissues are within normal limits.  The paranasal sinuses and mastoid air cells are well pneumatized and free of fluid.  Scalp soft tissues are unremarkable.  IMPRESSION: 1. No acute intracranial abnormality. 2. Mild atrophy with moderate chronic microvascular ischemic changes.   Electronically Signed  By: Jeannine Boga M.D.   On: 09/03/2013 01:08   Orders placed during the hospital encounter of 09/02/13  . ED EKG  . ED EKG  . EKG 12-LEAD  . EKG 12-LEAD  . EKG 12-LEAD  . EKG 12-LEAD    Assessment/Plan Active Problems:   Near syncope  Admit overnight for monitoring.  We'll check an MRI to rule out a small infarct.  This is likely vestibular and benign.  Lori Boone A 09/03/2013, 1:57 PM

## 2013-09-03 NOTE — Progress Notes (Signed)
Discharge to home, niece at bedside. Discharge instructions and follow up appointment done discussed with patient. PIV removed no s/s of infiltration or swelling noted.

## 2013-09-03 NOTE — Discharge Summary (Signed)
DISCHARGE SUMMARY  Lori Boone  Lori#: [id number]  DOB:1926-04-23  Date of Admission: 09/02/2013 Date of Discharge: 09/03/2013  Attending Physician:Kristell Wooding A  Patient's BJS:[id number],OHYWVPX A, MD  Consults:  none  Discharge Diagnoses: Active Problems:   Near syncope   Peripheral vestibular dysfunction   Essential hypertension   Hyperlipidemia  Discharge Medications:   Medication List         acetaminophen 500 MG tablet  Commonly known as:  TYLENOL  Take 500 mg by mouth as needed for pain.     ALPRAZolam 0.25 MG tablet  Commonly known as:  XANAX  Take 0.25 mg by mouth at bedtime as needed for sleep.     calcium-vitamin D 500-200 MG-UNIT per tablet  Commonly known as:  OSCAL WITH D  Take 1 tablet by mouth 2 (two) times daily.     cholecalciferol 1000 UNITS tablet  Commonly known as:  VITAMIN D  Take 2,000 Units by mouth daily.     magnesium gluconate 500 MG tablet  Commonly known as:  MAGONATE  Take 500 mg by mouth 2 (two) times daily.     MULTIVITAMIN PO  Take by mouth daily.     omeprazole 40 MG capsule  Commonly known as:  PRILOSEC  Take 40 mg by mouth every morning.     rosuvastatin 10 MG tablet  Commonly known as:  CRESTOR  10 mg. Take 1/2 tab daily     triamterene-hydrochlorothiazide 75-50 MG per tablet  Commonly known as:  MAXZIDE  Take 1 tablet by mouth daily.     verapamil 240 MG CR tablet  Commonly known as:  CALAN-SR  Take 240 mg by mouth daily.        Hospital Procedures: Dg Chest 2 View  09/02/2013   CLINICAL DATA:  Nausea and dizziness.  Near syncope.  EXAM: CHEST  2 VIEW  COMPARISON:  None.  FINDINGS: The cardiac silhouette is within normal limits for size. Prominent thoracic aortic calcification is present. The lungs are mildly hyperinflated. Minimal linear opacity is present in the left lung base. The lungs are otherwise clear. No pleural effusion or pneumothorax is seen. No acute osseous abnormality is identified.  IMPRESSION:  Minimal scarring or atelectasis in the left lung base. Mild hyperinflation.   Electronically Signed   By: [name]   On: 09/02/2013 22:27   Ct Head Wo Boone  09/03/2013   CLINICAL DATA:  Near syncope  EXAM: CT HEAD WITHOUT Boone  TECHNIQUE: Contiguous axial images were obtained from the base of the skull through the vertex without intravenous Boone.  COMPARISON:  Prior CT from 10/06/2011  FINDINGS: Mild atrophy with chronic microvascular ischemic changes are present. Atherosclerotic calcifications present within the carotid siphon once.  No mass lesion or midline shift. Gray-white matter differentiation is well maintained. Ventricles are normal in size without evidence of hydrocephalus. CSF containing spaces are within normal limits. No extra-axial fluid collection.  The calvarium is intact.  Orbital soft tissues are within normal limits.  The paranasal sinuses and mastoid air cells are well pneumatized and free of fluid.  Scalp soft tissues are unremarkable.  IMPRESSION: 1. No acute intracranial abnormality. 2. Mild atrophy with moderate chronic microvascular ischemic changes.   Electronically Signed   By: Jeannine Boga M.D.   On: 09/03/2013 01:08   Lori Boone  09/03/2013   CLINICAL DATA:  Lori Boone with weakness, nausea, syncope. Initial encounter.  EXAM: MRI HEAD WITHOUT Boone  TECHNIQUE: Multiplanar, multiecho pulse  sequences of the brain and surrounding structures were obtained without intravenous Boone.  COMPARISON:  Head CT without Boone 0018 hr the same day.  FINDINGS: Cerebral volume is within normal limits for age. Posterior fossa pulsation artifact incidentally noted on sagittal imaging. No restricted diffusion to suggest acute infarction. No midline shift, mass effect, evidence of mass lesion, ventriculomegaly, extra-axial collection or acute intracranial hemorrhage. Cervicomedullary junction and pituitary are within normal limits. Negative visualized  cervical spine. Major intracranial vascular flow voids are preserved. Mild for age scattered cerebral white matter T2 and FLAIR hyperintensity. Subcortical anterior temporal lobe involvement also noted, but no cerebral cortical encephalomalacia identified. Deep gray matter nuclei, brainstem, and cerebellum are within normal limits.  Grossly normal visualized internal auditory structures. Small volume left mastoid fluid. Negative nasopharynx.  Trace paranasal sinus fluid or mucosal thickening. Postoperative changes to the globes. Normal bone marrow signal. Visualized scalp soft tissues are within normal limits.  IMPRESSION: No acute intracranial abnormality and largely unremarkable for age non Boone MRI appearance of the brain.   Electronically Signed   By: Lars Pinks M.D.   On: 09/03/2013 14:19    History of Present Illness:  Lori Boone is a patient well-known to me with hyperlipidemia and essential hypertension presenting at this time after an episode of near syncope. She has had some vague GI symptoms include some nausea but no chest pain or shortness of breath. In recent days she's also had some vague headaches and some positional dizziness. Evidently beginning around noon yesterday started having an increase in her positional dizziness accompanied by some mild epigastric queasiness. Ultimately upon changing position quickly she wound up coming down to her knees but no frank loss of consciousness. She had no fever chills or sweats. She's had no chest pain or shortness of breath. He denies any abdominal pain and bowel habits have been normal. As result of living alone was felt that she needed to be admitted overnight for observation  Hospital Course: Patient was admitted following this near syncopal episode; by history it appeared to be more vestibular than anything else or vasovagal.  From cardiac standpoint there was nothing to suggest cardiac event.  Neurologic exam was normal.  Subsequent MRI of the  brain was negative.  She is discharged for further to be done as an outpatient.  She is clearly back to baseline.  Day of Discharge Exam BP 107/50  Pulse 90  Temp(Src) 98.6 F (37 C) (Oral)  Resp 16  Ht 4\' 11"  (1.499 m)  Wt 54.613 kg (120 lb 6.4 oz)  BMI 24.30 kg/m2  SpO2 100%  LMP 06/11/1976  Physical Exam: General appearance: alert, cooperative and no distress Eyes: no scleral icterus Throat: oropharynx moist without erythema Resp: clear to auscultation bilaterally Cardio: regular rate and rhythm, S1, S2 normal, no murmur, click, rub or gallop Extremities: no clubbing, cyanosis or edema Neuro exam is normal Abdomen is soft and nontender benign  Discharge Labs:  Recent Labs  09/02/13 1700 09/03/13 1440  NA 132*  --   K 3.9  --   CL 93*  --   CO2 25  --   GLUCOSE 125*  --   BUN 16  --   CREATININE 0.86 0.75  CALCIUM 9.7  --     Recent Labs  09/02/13 1700  AST 26  ALT 15  ALKPHOS 112  BILITOT 0.7  PROT 7.1  ALBUMIN 3.9    Recent Labs  09/02/13 1700 09/03/13 1440  WBC 12.7* 6.5  NEUTROABS 10.9*  --   HGB 13.9 13.9  HCT 41.0 40.5  MCV 90.9 91.4  PLT 345 304   No results found for this basename: CKTOTAL, CKMB, CKMBINDEX, TROPONINI,  in the last 72 hours No results found for this basename: TSH, T4TOTAL, FREET3, T3FREE, THYROIDAB,  in the last 72 hours No results found for this basename: VITAMINB12, FOLATE, FERRITIN, TIBC, IRON, RETICCTPCT,  in the last 72 hours  Discharge instructions:     Discharge Orders   Future Appointments Provider Department Dept Phone   03/09/2014 2:45 PM Lyman Speller, MD [facility] [phone]   Future Orders Complete By Expires   Diet - low sodium heart healthy  As directed    Increase activity slowly  As directed       Disposition: home  Follow-up Appts: Follow-up with Dr. Reynaldo Minium at [facility] in 1 week.  Call for appointment.  Condition on Discharge: improved stable  condition  Tests Needing Follow-up: none  Signed: Lamin Chandley A 09/03/2013, 5:18 PM

## 2013-09-03 NOTE — Care Management Note (Signed)
    Page 1 of 1   09/03/2013     2:27:35 PM   CARE MANAGEMENT NOTE 09/03/2013  Patient:  Lori Boone, Lori Boone   Account Number:  [id number]  Date Initiated:  09/03/2013  Documentation initiated by:  [facility]  Subjective/Objective Assessment:   80 Y/O F ADMITTED W/NEAR SYNCOPE,FALL.     Action/Plan:   FROM HOME.INDEP PTA.HAS PCP,PHARMACY.   Anticipated DC Date:  09/04/2013   Anticipated DC Plan:  [place]  CM consult      Choice offered to / List presented to:             Status of service:  In process, will continue to follow Medicare Important Message given?   (If response is "NO", the following Medicare IM given date fields will be blank) Date Medicare IM given:   Date Additional Medicare IM given:    Discharge Disposition:    Per UR Regulation:  Reviewed for med. necessity/level of care/duration of stay  If discussed at Long Length of Stay Meetings, dates discussed:    Comments:  09/03/13 Cathlene Gardella RN,BSN  NCM [phone] NO ANTICIPATED D/C NEEDS.

## 2013-09-06 NOTE — ED Provider Notes (Signed)
Medical screening examination/treatment/procedure(s) were conducted as a shared visit with non-physician practitioner(s) and myself.  I personally evaluated the patient during the encounter.   EKG Interpretation   Date/Time:  Wednesday September 02 2013 16:46:19 EDT Ventricular Rate:  68 PR Interval:  204 QRS Duration: 81 QT Interval:  424 QTC Calculation: 451 R Axis:   50 Text Interpretation:  Sinus rhythm ED PHYSICIAN INTERPRETATION AVAILABLE  IN [place] Confirmed by TEST, Record ([id number]) on 09/04/2013 7:44:16  AM        Houston Siren III, MD 09/06/13 1253

## 2013-09-06 NOTE — ED Provider Notes (Signed)
Medical screening examination/treatment/procedure(s) were conducted as a shared visit with non-physician practitioner(s) and myself.  I personally evaluated the patient during the encounter.   EKG Interpretation   Date/Time:  Wednesday September 02 2013 16:46:19 EDT Ventricular Rate:  68 PR Interval:  204 QRS Duration: 81 QT Interval:  424 QTC Calculation: 451 R Axis:   50 Text Interpretation:  Sinus rhythm ED PHYSICIAN INTERPRETATION AVAILABLE  IN [facility] Confirmed by TEST, Record ([id number]) on 09/04/2013 7:44:16  AM      80 yo female who became nauseated, ran to the bathroom, and became so dizzy that she fell to the floor.  She does not recall passing out.  On exam, well appearing, nontoxic, alert, oriented, heart sounds normal, rrr, lungs CTAB.  Plan admit for syncope/near syncope.   Clinical Impression: 1. Near syncope   2. Dizziness       Houston Siren III, MD 09/06/13 1253

## 2013-09-21 DIAGNOSIS — IMO0002 Reserved for concepts with insufficient information to code with codable children: Secondary | ICD-10-CM | POA: Diagnosis not present

## 2013-09-21 DIAGNOSIS — R51 Headache: Secondary | ICD-10-CM | POA: Diagnosis not present

## 2013-09-21 DIAGNOSIS — J309 Allergic rhinitis, unspecified: Secondary | ICD-10-CM | POA: Diagnosis not present

## 2013-09-21 DIAGNOSIS — I10 Essential (primary) hypertension: Secondary | ICD-10-CM | POA: Diagnosis not present

## 2013-09-21 DIAGNOSIS — H6980 Other specified disorders of Eustachian tube, unspecified ear: Secondary | ICD-10-CM | POA: Diagnosis not present

## 2013-11-23 ENCOUNTER — Other Ambulatory Visit: Payer: Self-pay

## 2013-11-23 ENCOUNTER — Ambulatory Visit (INDEPENDENT_AMBULATORY_CARE_PROVIDER_SITE_OTHER): Payer: Medicare Other | Admitting: Family Medicine

## 2013-11-23 ENCOUNTER — Emergency Department (HOSPITAL_COMMUNITY)
Admission: EM | Admit: 2013-11-23 | Discharge: 2013-11-24 | Disposition: A | Discharge status: Home / Self Care | Payer: Medicare Other | Attending: Emergency Medicine | Admitting: Emergency Medicine

## 2013-11-23 ENCOUNTER — Encounter (HOSPITAL_COMMUNITY): Payer: Self-pay | Admitting: Emergency Medicine

## 2013-11-23 ENCOUNTER — Emergency Department (HOSPITAL_COMMUNITY): Payer: Medicare Other

## 2013-11-23 VITALS — BP 148/64 | HR 79 | Temp 97.7°F | Resp 18 | Ht 60.0 in | Wt 123.0 lb

## 2013-11-23 DIAGNOSIS — I10 Essential (primary) hypertension: Secondary | ICD-10-CM | POA: Diagnosis not present

## 2013-11-23 DIAGNOSIS — Z79899 Other long term (current) drug therapy: Secondary | ICD-10-CM | POA: Insufficient documentation

## 2013-11-23 DIAGNOSIS — Z8742 Personal history of other diseases of the female genital tract: Secondary | ICD-10-CM | POA: Diagnosis not present

## 2013-11-23 DIAGNOSIS — Z8719 Personal history of other diseases of the digestive system: Secondary | ICD-10-CM | POA: Diagnosis not present

## 2013-11-23 DIAGNOSIS — E785 Hyperlipidemia, unspecified: Secondary | ICD-10-CM | POA: Insufficient documentation

## 2013-11-23 DIAGNOSIS — R002 Palpitations: Secondary | ICD-10-CM | POA: Diagnosis not present

## 2013-11-23 DIAGNOSIS — Z880 Allergy status to penicillin: Secondary | ICD-10-CM | POA: Insufficient documentation

## 2013-11-23 DIAGNOSIS — R0602 Shortness of breath: Secondary | ICD-10-CM | POA: Diagnosis not present

## 2013-11-23 DIAGNOSIS — R079 Chest pain, unspecified: Secondary | ICD-10-CM

## 2013-11-23 DIAGNOSIS — R071 Chest pain on breathing: Secondary | ICD-10-CM | POA: Insufficient documentation

## 2013-11-23 DIAGNOSIS — M949 Disorder of cartilage, unspecified: Secondary | ICD-10-CM | POA: Diagnosis not present

## 2013-11-23 DIAGNOSIS — M899 Disorder of bone, unspecified: Secondary | ICD-10-CM | POA: Diagnosis not present

## 2013-11-23 LAB — URINALYSIS, ROUTINE W REFLEX MICROSCOPIC
Bilirubin Urine: NEGATIVE
Glucose, UA: NEGATIVE mg/dL
Hgb urine dipstick: NEGATIVE
Ketones, ur: NEGATIVE mg/dL
NITRITE: NEGATIVE
Protein, ur: NEGATIVE mg/dL
SPECIFIC GRAVITY, URINE: 1.007 (ref 1.005–1.030)
UROBILINOGEN UA: 0.2 mg/dL (ref 0.0–1.0)
pH: 7.5 (ref 5.0–8.0)

## 2013-11-23 LAB — CBC
HEMATOCRIT: 38.7 % (ref 36.0–46.0)
Hemoglobin: 13.6 g/dL (ref 12.0–15.0)
MCH: 31.6 pg (ref 26.0–34.0)
MCHC: 35.1 g/dL (ref 30.0–36.0)
MCV: 89.8 fL (ref 78.0–100.0)
PLATELETS: 318 10*3/uL (ref 150–400)
RBC: 4.31 MIL/uL (ref 3.87–5.11)
RDW: 12.2 % (ref 11.5–15.5)
WBC: 10 10*3/uL (ref 4.0–10.5)

## 2013-11-23 LAB — I-STAT TROPONIN, ED: TROPONIN I, POC: 0.01 ng/mL (ref 0.00–0.08)

## 2013-11-23 LAB — URINE MICROSCOPIC-ADD ON

## 2013-11-23 NOTE — ED Notes (Signed)
PT arrived via EMS in gown already. Monitored by 12-lead, pulse ox, and bp cuff.

## 2013-11-23 NOTE — ED Notes (Addendum)
EKG signed by Dr. Stevie Kern.

## 2013-11-23 NOTE — ED Notes (Signed)
Patient presents to ED via [facility]. Patient states that she was working out this morning and starting noticing a "discomfort" to her mid chest. Denies any shortness of breath, denies any radiation and no n/v. Pt states that the pain is intermittent and worse upon palpation. EKG unremarkable per EMS. A&Ox4. Placed on cardiac monitor.

## 2013-11-23 NOTE — Progress Notes (Signed)
Subjective: Patient is here complaining of chest pain. It started at 9:30 this morning when she was exercising. She feels a little twinges off palpitation or chest pain that happened at about 30 seconds intervals. She said it happened for little while, the knees some, but is persisted all day. She did take her granddaughter shopping. She was going to go to a movie but didn't. She had no referral of the pain. No nausea or vomiting. But she says it is persisted as a pressure-like hurting in her chest and in the low sternal area. She has a history of a hiatal hernia, but this seems different than that. She denies chest wall pain or injuring it, though she said she could have strained her chest with exercise. She is anxious, and she took a Xanax at home but that did not help it. She took 2 aspirin at home  Objective: Anxious appearing lady in no major distress. Throat clear. Neck supple without nodes thyromegaly. No carotid bruits. Chest clear. Heart regular without murmurs. Listen to her repeatedly for some prolonged times and only occasionally her a possible ectopic beats. Abdomen soft without masses or tenderness.  GI cocktail did not seem to help at all.  EKG is normal.  Observed her, and symptoms continue to persist. Although the symptoms are not quite typical I decided she needed to be sent to the emergency room.   Assessment: Chest pain, somewhat atypical, but pressure-like and persistent. Palpitations History of GERD Anxiety  Plan:  After careful attention and concentration of all options, decided to send her to the emergency room for additional workup. Explained this to the patient and she was agreeable. Explained to her 29 year old grandchild last weight here for a ride home. Sent by EMS

## 2013-11-24 ENCOUNTER — Telehealth: Payer: Self-pay | Admitting: Cardiovascular Disease

## 2013-11-24 LAB — COMPREHENSIVE METABOLIC PANEL
ALT: 22 U/L (ref 0–35)
AST: 26 U/L (ref 0–37)
Albumin: 3.5 g/dL (ref 3.5–5.2)
Alkaline Phosphatase: 124 U/L — ABNORMAL HIGH (ref 39–117)
BUN: 13 mg/dL (ref 6–23)
CO2: 26 mEq/L (ref 19–32)
Calcium: 9.1 mg/dL (ref 8.4–10.5)
Chloride: 91 mEq/L — ABNORMAL LOW (ref 96–112)
Creatinine, Ser: 0.88 mg/dL (ref 0.50–1.10)
GFR calc Af Amer: 67 mL/min — ABNORMAL LOW (ref >90)
GFR calc non Af Amer: 58 mL/min — ABNORMAL LOW (ref >90)
Glucose, Bld: 99 mg/dL (ref 70–99)
POTASSIUM: 3.4 meq/L — AB (ref 3.7–5.3)
Sodium: 132 mEq/L — ABNORMAL LOW (ref 137–147)
TOTAL PROTEIN: 6.7 g/dL (ref 6.0–8.3)
Total Bilirubin: 0.5 mg/dL (ref 0.3–1.2)

## 2013-11-24 NOTE — Telephone Encounter (Signed)
New problem    Pt was seen in ED last night and daughter want to talk to nurse about pt getting an appt this week with physician. Please advise

## 2013-11-24 NOTE — Telephone Encounter (Signed)
Thanks, chris 

## 2013-11-24 NOTE — Discharge Instructions (Signed)

## 2013-11-24 NOTE — Telephone Encounter (Signed)
Spoke with patient's daughter, Lori Boone, who states patient went to urgent care last night and was transferred to [facility] for chest pressure.  Patient saw Dr. Linna Darner at urgent care who did an ekg and states he did not see any problems but he did not feel comfortable sending patient home.  Patient was transferred to [facility] and was discharged early this morning and dx with:  Non-specific chest pain Patient states today she has midsternal chest pain that is reproducable to touch; patient denies pain with inspiration; denies nausea.  Patient states she did have SOB yesterday, none today.  Patient c/o light headedness today, denies diaphoresis or dizziness.  Patient was advised to f/u with Dr. Angelena Form this week.  I advised patient's daughter that Dr. Angelena Form is out of the office this week and that his first available appointment is Monday 6/22.  I offered patient choice between waiting to see Dr. Angelena Form on Monday or to see someone else this week.  Patient elects to see Dr. Angelena Form on Monday; appointment scheduled.  I advised patient's daughter who verbalized message to patient while on the telephone with me to call back or to return to the ER with worsening chest pain or other symtoms, pain that changes or radiates to another area, SOB, and/or other symptoms that concern her.  Patient and daughter verbalized understanding and agreement.  I am routing message to Dr. Angelena Form so that he will be aware.

## 2013-11-24 NOTE — ED Provider Notes (Signed)
CSN: [id number]     Arrival date & time 11/23/13  09-17-2054 History   First MD Initiated Contact with Patient 11/23/13 September 17, 2222     Chief Complaint  Patient presents with  . Pleurisy     (Consider location/radiation/quality/duration/timing/severity/associated sxs/prior Treatment) HPI 80 year old female with 14 hours of constant gradual onset nonradiating mild ache in her chest without associated symptoms with a positional component worse if she lies down worse with pressing on her chest wall, nonexertional, nonpleuritic, without fever without cough without shortness of breath other concerns and without treatment prior to arrival. Past Medical History  Diagnosis Date  . Hyperlipidemia   . Hypertension   . Osteopenia   . PAC (premature atrial contraction)   . Mastodynia   . Ovarian cyst     h/o  . Diverticulitis   . Stomach ulcer 11/10/12    dx with endoscopy, will repeat in 8/14 and will stretch esophagus   Past Surgical History  Procedure Laterality Date  . Back surgery  1978  . Ankle surgery  1987  . Cataracts    . Mouth surgery      implants   Family History  Problem Relation Age of Onset  . Coronary artery disease Father   . Heart attack Father   . Heart failure Mother   . Colon cancer Brother    History  Substance Use Topics  . Smoking status: Never Smoker   . Smokeless tobacco: Never Used     Comment: 20 yrs ago.  . Alcohol Use: 1.8 oz/week    3 Glasses of wine per week   OB History   Grav Para Term Preterm Abortions TAB SAB Ect Mult Living   2 2        2      Obstetric Comments   One child is deceased September 16, 2005     Review of Systems  [place] reviewed and are negative for acute change except as noted in the HPI.  Allergies  Codeine and Penicillins  Home Medications   Prior to Admission medications   Medication Sig Start Date End Date Taking? Authorizing Provider  acetaminophen (TYLENOL) 500 MG tablet Take 1,000 mg by mouth as needed for pain.    Yes  Historical Provider, MD  ALPRAZolam Duanne Moron) 0.25 MG tablet Take 0.25 mg by mouth at bedtime as needed for sleep.   Yes Historical Provider, MD  atorvastatin (LIPITOR) 20 MG tablet Take 20 mg by mouth daily at 6 PM. 11/19/13  Yes Historical Provider, MD  calcium-vitamin D (OSCAL WITH D) 500-200 MG-UNIT per tablet Take 1 tablet by mouth 2 (two) times daily.   Yes Historical Provider, MD  cholecalciferol (VITAMIN D) 1000 UNITS tablet Take 1,000 Units by mouth daily at 6 PM.    Yes Historical Provider, MD  magnesium gluconate (MAGONATE) 500 MG tablet Take 500 mg by mouth 2 (two) times daily.   Yes Historical Provider, MD  omeprazole (PRILOSEC) 40 MG capsule Take 40 mg by mouth every morning.  12/08/12  Yes Historical Provider, MD  Polyethyl Glycol-Propyl Glycol (SYSTANE OP) Place 1 drop into both eyes daily as needed (for dry eyes).   Yes Historical Provider, MD  Probiotic Product (PROBIOTIC DAILY PO) Take 1 tablet by mouth every morning.   Yes Historical Provider, MD  sodium chloride (OCEAN) 0.65 % SOLN nasal spray Place 1 spray into both nostrils daily at 6 PM.   Yes Historical Provider, MD  triamcinolone (NASACORT ALLERGY 24HR) 55 MCG/ACT AERO nasal inhaler Place 2  sprays into both nostrils every morning.   Yes Historical Provider, MD  triamterene-hydrochlorothiazide (MAXZIDE) 75-50 MG per tablet Take 1 tablet by mouth daily.     Yes Historical Provider, MD  verapamil (CALAN-SR) 240 MG CR tablet Take 240 mg by mouth daily.    Yes Historical Provider, MD   BP 153/58  Pulse 74  Resp 23  Ht 5' (1.524 m)  Wt 123 lb (55.792 kg)  BMI 24.02 kg/m2  SpO2 93%  LMP 06/11/1976 Physical Exam  Nursing note and vitals reviewed. Constitutional:  Awake, alert, nontoxic appearance.  HENT:  Head: Atraumatic.  Eyes: Right eye exhibits no discharge. Left eye exhibits no discharge.  Neck: Neck supple.  Cardiovascular: Normal rate and regular rhythm.   No murmur heard. Pulmonary/Chest: Effort normal and breath  sounds normal. No respiratory distress. She has no wheezes. She has no rales. She exhibits tenderness.   Mild sternal lower chest wall tenderness  Abdominal: Soft. There is no tenderness. There is no rebound.  Musculoskeletal: She exhibits no tenderness.  Baseline ROM, no obvious new focal weakness.  Neurological:  Mental status and motor strength appears baseline for patient and situation.  Skin: No rash noted.  Psychiatric: She has a normal mood and affect.    ED Course  Procedures (including critical care time) Patient / Family / Caregiver informed of clinical course, understand medical decision-making process, and agree with plan. Labs Review Labs Reviewed  COMPREHENSIVE METABOLIC PANEL - Abnormal; Notable for the following:    Sodium 132 (*)    Potassium 3.4 (*)    Chloride 91 (*)    Alkaline Phosphatase 124 (*)    GFR calc non Af Amer 58 (*)    GFR calc Af Amer 67 (*)    All other components within normal limits  URINALYSIS, ROUTINE W REFLEX MICROSCOPIC - Abnormal; Notable for the following:    Color, Urine STRAW (*)    Leukocytes, UA SMALL (*)    All other components within normal limits  URINE MICROSCOPIC-ADD ON - Abnormal; Notable for the following:    Squamous Epithelial / LPF FEW (*)    All other components within normal limits  CBC  I-STAT TROPOININ, ED    Imaging Review Dg Chest 2 View  11/24/2013   CLINICAL DATA:  Shortness of breath and mid chest pain.  EXAM: CHEST  2 VIEW  COMPARISON:  Chest radiograph performed 09/02/2013  FINDINGS: The lungs are well-aerated and clear. There is no evidence of focal opacification, pleural effusion or pneumothorax. A small calcified granuloma is noted at the left midlung zone.  The heart is normal in size; the mediastinal contour is within normal limits. Calcification is seen along the aortic arch. No acute osseous abnormalities are seen.  IMPRESSION: No acute cardiopulmonary process seen.   Electronically Signed   By: Garald Balding M.D.   On: 11/24/2013 00:29     EKG Interpretation   Date/Time:  Monday November 23 2013 21:00:39 EDT Ventricular Rate:  75 PR Interval:  204 QRS Duration: 85 QT Interval:  421 QTC Calculation: 470 R Axis:   69 Text Interpretation:  Sinus rhythm No significant change since last  tracing Confirmed by [facility]  MD, Jenny Reichmann ([id number]) on 11/24/2013 12:59:00 AM      MDM   Final diagnoses:  Chest pain    I doubt any other EMC precluding discharge at this time including, but not necessarily limited to the following:AMI.    Babette Relic, MD 11/25/13 [phone]

## 2013-11-30 ENCOUNTER — Encounter: Payer: Self-pay | Admitting: Cardiovascular Disease

## 2013-11-30 ENCOUNTER — Ambulatory Visit (INDEPENDENT_AMBULATORY_CARE_PROVIDER_SITE_OTHER): Payer: Medicare Other | Admitting: Cardiovascular Disease

## 2013-11-30 VITALS — BP 136/62 | HR 80 | Ht 60.0 in | Wt 120.0 lb

## 2013-11-30 DIAGNOSIS — I491 Atrial premature depolarization: Secondary | ICD-10-CM

## 2013-11-30 NOTE — Patient Instructions (Signed)
Your physician wants you to follow-up in:  12 months.  You will receive a reminder letter in the mail two months in advance. If you don't receive a letter, please call our office to schedule the follow-up appointment.   

## 2013-11-30 NOTE — Progress Notes (Signed)
History of Present Illness: 80 yo female with history of HTN, HLD, former tobacco abuse, GERD here today for cardiac follow up. I saw her for the first time 04/18/11 with c/o weakness and dyspnea. She had no chest discomfort during that episode. She felt weak and sat down. No dizziness. No recent illnesses. She had been bitten by a tick several days before her episode. She did describe frequent episodes of upper epigastric pain with rest and with exertion. I arranged a stress myoview. Given her baseline EKG changes, Lexiscan was used. Her LVEF was 84%. There was no evidence of ischemia. She had carotid dopplers July 2013 in the [facility] office with mild bilateral carotid artery stenosis. I saw her in November 2013 and she had continued complaints of weakness, fatigue and dyspnea which seemed to be associated with palpitations. No chest pain. I arranged a 21 day event monitor which showed PACs but no atrial fib/flutter or SVT. Admitted to primary care service at [facility] March 2015 after near syncopal event. Head MRI was ok. Felt to be vestibular. Seen in the ED 11/23/13 with chest pain, reproducible to touch and felt to be non-cardiac. EKG did not show ischemic changes.   She is here today for follow up.  She is feeling well. No chest pain or SOB. She has occasional palpitations. No weak spells. No syncope.   Primary Care Physician: Dr. Reynaldo Minium with [facility] Lipid Profile: Followed in primary care.   Past Medical History  Diagnosis Date  . Hyperlipidemia   . Hypertension   . Osteopenia   . PAC (premature atrial contraction)   . Mastodynia   . Ovarian cyst     h/o  . Diverticulitis   . Stomach ulcer 11/10/12    dx with endoscopy, will repeat in 8/14 and will stretch esophagus    Past Surgical History  Procedure Laterality Date  . Back surgery  1978  . Ankle surgery  1987  . Cataracts    . Mouth surgery      implants    Current Outpatient Prescriptions  Medication  Sig Dispense Refill  . acetaminophen (TYLENOL) 500 MG tablet Take 1,000 mg by mouth as needed for pain.       Marland Kitchen atorvastatin (LIPITOR) 20 MG tablet Take 20 mg by mouth daily at 6 PM.      . calcium-vitamin D (OSCAL WITH D) 500-200 MG-UNIT per tablet Take 1 tablet by mouth 2 (two) times daily.      . cholecalciferol (VITAMIN D) 1000 UNITS tablet Take 1,000 Units by mouth daily at 6 PM.       . LORazepam (ATIVAN) 0.5 MG tablet Take 0.5 mg by mouth as needed for anxiety.      . magnesium gluconate (MAGONATE) 500 MG tablet Take 500 mg by mouth 2 (two) times daily.      Marland Kitchen omeprazole (PRILOSEC) 40 MG capsule Take 40 mg by mouth every morning.       Vladimir Faster Glycol-Propyl Glycol (SYSTANE OP) Place 1 drop into both eyes daily as needed (for dry eyes).      . Probiotic Product (PROBIOTIC DAILY PO) Take 1 tablet by mouth every morning.      . sodium chloride (OCEAN) 0.65 % SOLN nasal spray Place 1 spray into both nostrils daily at 6 PM.      . triamcinolone (NASACORT ALLERGY 24HR) 55 MCG/ACT AERO nasal inhaler Place 2 sprays into both nostrils every morning.      Marland Kitchen  triamterene-hydrochlorothiazide (MAXZIDE) 75-50 MG per tablet Take 1 tablet by mouth daily.        . verapamil (CALAN-SR) 240 MG CR tablet Take 240 mg by mouth daily.        No current facility-administered medications for this visit.    Allergies  Allergen Reactions  . Codeine Nausea Only  . Penicillins     Makes patient not feel well all over    History   Social History  . Marital Status: Single    Spouse Name: N/A    Number of Children: 2  . Years of Education: N/A   Occupational History  . Retired office work    Social History Main Topics  . Smoking status: Never Smoker   . Smokeless tobacco: Never Used     Comment: 20 yrs ago.  . Alcohol Use: 1.8 oz/week    3 Glasses of wine per week  . Drug Use: No  . Sexual Activity: No   Other Topics Concern  . Not on file   Social History Narrative  . No narrative on file     Family History  Problem Relation Age of Onset  . Coronary artery disease Father   . Heart attack Father   . Heart failure Mother   . Colon cancer Brother     Review of Systems:  As stated in the HPI and otherwise negative.   BP 136/62  Pulse 80  Ht 5' (1.524 m)  Wt 120 lb (54.432 kg)  BMI 23.44 kg/m2  LMP 06/11/1976  Physical Examination: General: Well developed, well nourished, NAD HEENT: OP clear, mucus membranes moist SKIN: warm, dry. No rashes. Neuro: No focal deficits Musculoskeletal: Muscle strength 5/5 all ext Psychiatric: Mood and affect normal Neck: No JVD, no carotid bruits, no thyromegaly, no lymphadenopathy. Lungs:Clear bilaterally, no wheezes, rhonci, crackles Cardiovascular: Regular rate and rhythm. No murmurs, gallops or rubs. Abdomen:Soft. Bowel sounds present. Non-tender.  Extremities: No lower extremity edema. Pulses are 2 + in the bilateral DP/PT.  Assessment and Plan:   1. Palpitations/PACs: She was found to have premature atrial contractions on 21 day event monitor in November 2013. No evidence of atrial fibrillation/flutter or SVT. She is doing well. No palpitations. Will continue verapamil. She is asked to avoid stimulants such as caffeine.

## 2013-12-31 DIAGNOSIS — IMO0002 Reserved for concepts with insufficient information to code with codable children: Secondary | ICD-10-CM | POA: Diagnosis not present

## 2013-12-31 DIAGNOSIS — K219 Gastro-esophageal reflux disease without esophagitis: Secondary | ICD-10-CM | POA: Diagnosis not present

## 2013-12-31 DIAGNOSIS — M1990 Unspecified osteoarthritis, unspecified site: Secondary | ICD-10-CM | POA: Diagnosis not present

## 2013-12-31 DIAGNOSIS — E785 Hyperlipidemia, unspecified: Secondary | ICD-10-CM | POA: Diagnosis not present

## 2013-12-31 DIAGNOSIS — I10 Essential (primary) hypertension: Secondary | ICD-10-CM | POA: Diagnosis not present

## 2014-01-19 DIAGNOSIS — Z961 Presence of intraocular lens: Secondary | ICD-10-CM | POA: Diagnosis not present

## 2014-01-19 DIAGNOSIS — H43819 Vitreous degeneration, unspecified eye: Secondary | ICD-10-CM | POA: Diagnosis not present

## 2014-01-19 DIAGNOSIS — H524 Presbyopia: Secondary | ICD-10-CM | POA: Diagnosis not present

## 2014-01-19 DIAGNOSIS — H04129 Dry eye syndrome of unspecified lacrimal gland: Secondary | ICD-10-CM | POA: Diagnosis not present

## 2014-02-12 DIAGNOSIS — M19079 Primary osteoarthritis, unspecified ankle and foot: Secondary | ICD-10-CM | POA: Diagnosis not present

## 2014-02-12 DIAGNOSIS — M25579 Pain in unspecified ankle and joints of unspecified foot: Secondary | ICD-10-CM | POA: Diagnosis not present

## 2014-02-24 DIAGNOSIS — L570 Actinic keratosis: Secondary | ICD-10-CM | POA: Diagnosis not present

## 2014-02-24 DIAGNOSIS — L821 Other seborrheic keratosis: Secondary | ICD-10-CM | POA: Diagnosis not present

## 2014-02-24 DIAGNOSIS — C44319 Basal cell carcinoma of skin of other parts of face: Secondary | ICD-10-CM | POA: Diagnosis not present

## 2014-02-24 DIAGNOSIS — D1801 Hemangioma of skin and subcutaneous tissue: Secondary | ICD-10-CM | POA: Diagnosis not present

## 2014-02-24 DIAGNOSIS — L819 Disorder of pigmentation, unspecified: Secondary | ICD-10-CM | POA: Diagnosis not present

## 2014-02-24 DIAGNOSIS — D235 Other benign neoplasm of skin of trunk: Secondary | ICD-10-CM | POA: Diagnosis not present

## 2014-03-03 ENCOUNTER — Encounter: Payer: Self-pay | Admitting: Obstetrics & Gynecology

## 2014-03-03 ENCOUNTER — Ambulatory Visit (INDEPENDENT_AMBULATORY_CARE_PROVIDER_SITE_OTHER): Payer: Medicare Other | Admitting: Obstetrics & Gynecology

## 2014-03-03 ENCOUNTER — Ambulatory Visit: Payer: Medicare Other | Admitting: Obstetrics & Gynecology

## 2014-03-03 VITALS — BP 132/70 | HR 72 | Ht 59.5 in | Wt 121.0 lb

## 2014-03-03 DIAGNOSIS — M8580 Other specified disorders of bone density and structure, unspecified site: Secondary | ICD-10-CM

## 2014-03-03 DIAGNOSIS — N9089 Other specified noninflammatory disorders of vulva and perineum: Secondary | ICD-10-CM | POA: Diagnosis not present

## 2014-03-03 DIAGNOSIS — Z124 Encounter for screening for malignant neoplasm of cervix: Secondary | ICD-10-CM | POA: Diagnosis not present

## 2014-03-03 DIAGNOSIS — Z01419 Encounter for gynecological examination (general) (routine) without abnormal findings: Secondary | ICD-10-CM | POA: Diagnosis not present

## 2014-03-03 DIAGNOSIS — D280 Benign neoplasm of vulva: Secondary | ICD-10-CM | POA: Diagnosis not present

## 2014-03-03 DIAGNOSIS — M899 Disorder of bone, unspecified: Secondary | ICD-10-CM | POA: Diagnosis not present

## 2014-03-03 DIAGNOSIS — M949 Disorder of cartilage, unspecified: Secondary | ICD-10-CM

## 2014-03-03 NOTE — Progress Notes (Signed)
80 y.o. G2P2 SingleCaucasianF here for annual exam.  Just had a basal cell carcinoma removed from nose.  Doing well.  No vaginal bleeding.    PCP:  Dr. Reynaldo Minium.  Has been admitted twice this year.  Once due to palpitations.  Now has cardiology--Dr. Angelena Form.   Another admission was from weakness.  Did had brain MRI which was negative.  Reports having a little more issue with waking up at night and increased voiding at night.  Doesn't want any new medications for this.     Patient's last menstrual period was 06/11/1976.          Sexually active: No.  The current method of family planning is post menopausal status.    Exercising: Yes.    Gym/ health club routine includes low impact aerobics. Smoker:  no  Health Maintenance: Pap:  12/30/12 WNL  History of abnormal Pap:  no MMG:  07/07/13 Bi-Rads neg Colonoscopy:  2/08, Dr. Earlean Shawl. 6/14 endo endoscopy with Dr. Earlean Shawl. Ulcer and esophagus stretched BMD:   3/10, -1.5/-2.0 TDaP:  05/2007 Screening Labs: pcp, Hb today: pcp, Urine today: pcp   reports that she has never smoked. She has never used smokeless tobacco. She reports that she drinks about 1.8 ounces of alcohol per week. She reports that she does not use illicit drugs.  Past Medical History  Diagnosis Date  . Hyperlipidemia   . Hypertension   . Osteopenia   . PAC (premature atrial contraction)   . Mastodynia   . Ovarian cyst     h/o  . Diverticulitis   . Stomach ulcer 11/10/12    dx with endoscopy, will repeat in 8/14 and will stretch esophagus    Past Surgical History  Procedure Laterality Date  . Back surgery  1978  . Ankle surgery  1987  . Cataracts    . Mouth surgery      implants    Current Outpatient Prescriptions  Medication Sig Dispense Refill  . acetaminophen (TYLENOL) 500 MG tablet Take 1,000 mg by mouth as needed for pain.       Marland Kitchen atorvastatin (LIPITOR) 20 MG tablet Take 20 mg by mouth daily at 6 PM.      . calcium-vitamin D (OSCAL WITH D) 500-200 MG-UNIT  per tablet Take 1 tablet by mouth 2 (two) times daily.      . cholecalciferol (VITAMIN D) 1000 UNITS tablet Take 1,000 Units by mouth daily at 6 PM.       . LORazepam (ATIVAN) 0.5 MG tablet Take 0.5 mg by mouth as needed for anxiety.      . magnesium gluconate (MAGONATE) 500 MG tablet Take 500 mg by mouth 2 (two) times daily.      Marland Kitchen omeprazole (PRILOSEC) 40 MG capsule Take 40 mg by mouth every morning.       Vladimir Faster Glycol-Propyl Glycol (SYSTANE OP) Place 1 drop into both eyes daily as needed (for dry eyes).      . Probiotic Product (PROBIOTIC DAILY PO) Take 1 tablet by mouth every morning.      . sodium chloride (OCEAN) 0.65 % SOLN nasal spray Place 1 spray into both nostrils daily at 6 PM.      . triamcinolone (NASACORT ALLERGY 24HR) 55 MCG/ACT AERO nasal inhaler Place 2 sprays into both nostrils every morning.      . triamterene-hydrochlorothiazide (MAXZIDE) 75-50 MG per tablet Take 1 tablet by mouth daily.        . verapamil (CALAN-SR) 240 MG CR  tablet Take 240 mg by mouth daily.        No current facility-administered medications for this visit.    Family History  Problem Relation Age of Onset  . Coronary artery disease Father   . Heart attack Father   . Heart failure Mother   . Colon cancer Brother     ROS:  Pertinent items are noted in HPI.  Otherwise, a comprehensive ROS was negative.  Exam:   BP 132/70  Pulse 72  Ht 4' 11.5" (1.511 m)  Wt 121 lb (54.885 kg)  BMI 24.04 kg/m2  LMP 06/11/1976  Weight change: +2#  Height:   Height: 4' 11.5" (151.1 cm)  Ht Readings from Last 3 Encounters:  03/03/14 4' 11.5" (1.511 m)  11/30/13 5' (1.524 m)  11/23/13 5' (1.524 m)    General appearance: alert, cooperative and appears stated age Head: Normocephalic, without obvious abnormality, atraumatic Neck: no adenopathy, supple, symmetrical, trachea midline and thyroid normal to inspection and palpation Lungs: clear to auscultation bilaterally Breasts: normal appearance, no masses  or tenderness Heart: regular rate and rhythm Abdomen: soft, non-tender; bowel sounds normal; no masses,  no organomegaly Extremities: extremities normal, atraumatic, no cyanosis or edema Skin: Skin color, texture, turgor normal. No rashes or lesions Lymph nodes: Cervical, supraclavicular, and axillary nodes normal. No abnormal inguinal nodes palpated Neurologic: Grossly normal   Pelvic: External genitalia:  no lesions              Urethra:  normal appearing urethra with no masses, tenderness or lesions              Bartholins and Skenes: normal                 Vagina: normal appearing vagina with normal color and discharge, no lesions              Cervix: no lesions              Pap taken: No. Bimanual Exam:  Uterus:  normal size, contour, position, consistency, mobility, non-tender              Adnexa: normal adnexa and no mass, fullness, tenderness               Rectovaginal: Confirms               Anus:  normal sphincter tone, 10mm raised, red lesion on left inferior aspect of skin near rectum  Recommended excision of lesion.  Verbal and written consent obtained.  Area cleansed with Betadine x 3.  0.4cc 1% Lidocaine used to anesthetized skin.  Lesion excised fully with sterile pick-ups and sterile scissors.  Silver nitrate used for excellent hemostasis.  No dressing applied.    A:  Well Woman with normal exam  PMP, No HRT  GERD.  Last endoscopy with Dr. Earlean Shawl 2014 H/o ovarian cysts (last u/s 1/13)  htn and elevated lipids  Buttocks skin lesion  P: Mammogram 1/15 pap smear 2013.  No pap today. BMD 1/16 with MMG.  Order placed.  Dr. Earlean Shawl recommended no further colonoscopy.  Sees Dr. Reynaldo Minium, PCP, every 6 months. Lesion excision to pathology.  Report will be called to pt.  return annually or prn  An After Visit Summary was printed and given to the patient.

## 2014-03-03 NOTE — Addendum Note (Signed)
Addended by: Megan Salon on: 03/03/2014 01:37 PM   Modules accepted: Orders

## 2014-03-09 ENCOUNTER — Ambulatory Visit: Payer: Medicare Other | Admitting: Obstetrics & Gynecology

## 2014-03-16 DIAGNOSIS — M25572 Pain in left ankle and joints of left foot: Secondary | ICD-10-CM | POA: Diagnosis not present

## 2014-03-27 DIAGNOSIS — Z23 Encounter for immunization: Secondary | ICD-10-CM | POA: Diagnosis not present

## 2014-04-07 DIAGNOSIS — Z85828 Personal history of other malignant neoplasm of skin: Secondary | ICD-10-CM | POA: Diagnosis not present

## 2014-04-07 DIAGNOSIS — Z08 Encounter for follow-up examination after completed treatment for malignant neoplasm: Secondary | ICD-10-CM | POA: Diagnosis not present

## 2014-04-12 ENCOUNTER — Encounter: Payer: Self-pay | Admitting: Obstetrics & Gynecology

## 2014-06-15 ENCOUNTER — Other Ambulatory Visit: Payer: Self-pay

## 2014-06-15 DIAGNOSIS — Z1231 Encounter for screening mammogram for malignant neoplasm of breast: Secondary | ICD-10-CM

## 2014-07-07 DIAGNOSIS — Z85828 Personal history of other malignant neoplasm of skin: Secondary | ICD-10-CM | POA: Diagnosis not present

## 2014-07-07 DIAGNOSIS — Z08 Encounter for follow-up examination after completed treatment for malignant neoplasm: Secondary | ICD-10-CM | POA: Diagnosis not present

## 2014-07-08 ENCOUNTER — Ambulatory Visit
Admission: RE | Admit: 2014-07-08 | Discharge: 2014-07-08 | Disposition: A | Discharge status: Home / Self Care | Payer: Medicare Other | Source: Ambulatory Visit

## 2014-07-08 DIAGNOSIS — Z1231 Encounter for screening mammogram for malignant neoplasm of breast: Secondary | ICD-10-CM

## 2014-07-09 DIAGNOSIS — I10 Essential (primary) hypertension: Secondary | ICD-10-CM | POA: Diagnosis not present

## 2014-07-09 DIAGNOSIS — R8290 Unspecified abnormal findings in urine: Secondary | ICD-10-CM | POA: Diagnosis not present

## 2014-07-09 DIAGNOSIS — E785 Hyperlipidemia, unspecified: Secondary | ICD-10-CM | POA: Diagnosis not present

## 2014-07-09 DIAGNOSIS — R8299 Other abnormal findings in urine: Secondary | ICD-10-CM | POA: Diagnosis not present

## 2014-07-12 DIAGNOSIS — Z1389 Encounter for screening for other disorder: Secondary | ICD-10-CM | POA: Diagnosis not present

## 2014-07-12 DIAGNOSIS — Z0000 Encounter for general adult medical examination without abnormal findings: Secondary | ICD-10-CM | POA: Diagnosis not present

## 2014-07-12 DIAGNOSIS — E785 Hyperlipidemia, unspecified: Secondary | ICD-10-CM | POA: Diagnosis not present

## 2014-07-12 DIAGNOSIS — Z6823 Body mass index (BMI) 23.0-23.9, adult: Secondary | ICD-10-CM | POA: Diagnosis not present

## 2014-07-12 DIAGNOSIS — M1990 Unspecified osteoarthritis, unspecified site: Secondary | ICD-10-CM | POA: Diagnosis not present

## 2014-07-12 DIAGNOSIS — I10 Essential (primary) hypertension: Secondary | ICD-10-CM | POA: Diagnosis not present

## 2014-07-15 DIAGNOSIS — Z1212 Encounter for screening for malignant neoplasm of rectum: Secondary | ICD-10-CM | POA: Diagnosis not present

## 2014-08-31 DIAGNOSIS — H903 Sensorineural hearing loss, bilateral: Secondary | ICD-10-CM | POA: Diagnosis not present

## 2014-09-13 DIAGNOSIS — Z6823 Body mass index (BMI) 23.0-23.9, adult: Secondary | ICD-10-CM | POA: Diagnosis not present

## 2014-09-13 DIAGNOSIS — D110 Benign neoplasm of parotid gland: Secondary | ICD-10-CM | POA: Diagnosis not present

## 2014-12-18 IMAGING — CR DG CHEST 2V
2 series · 2 of 2 positions shown · non-contrast
Comparison: None.

CLINICAL DATA: Nausea and dizziness.  Near syncope.

EXAM:
CHEST  2 VIEW

[w chest pa]
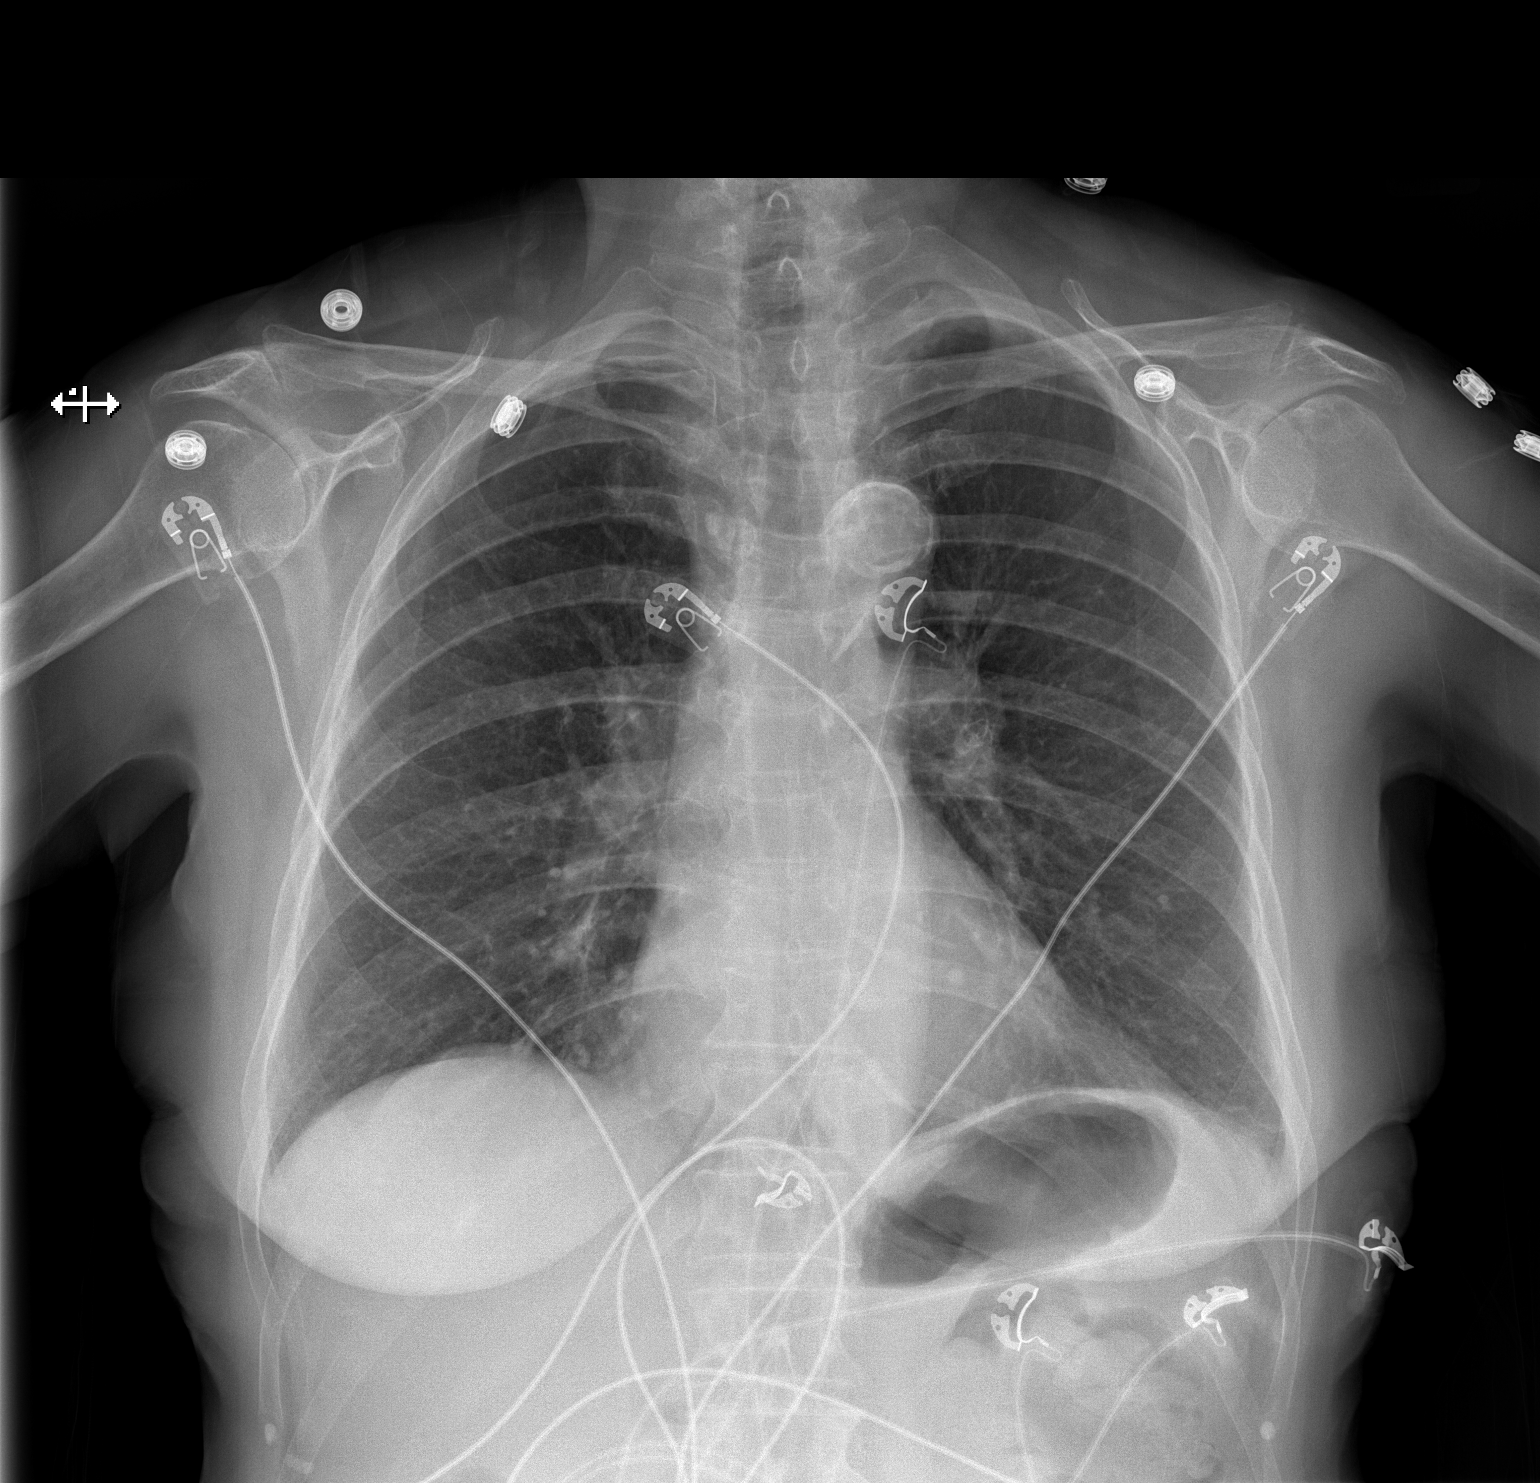

[w chest lat]
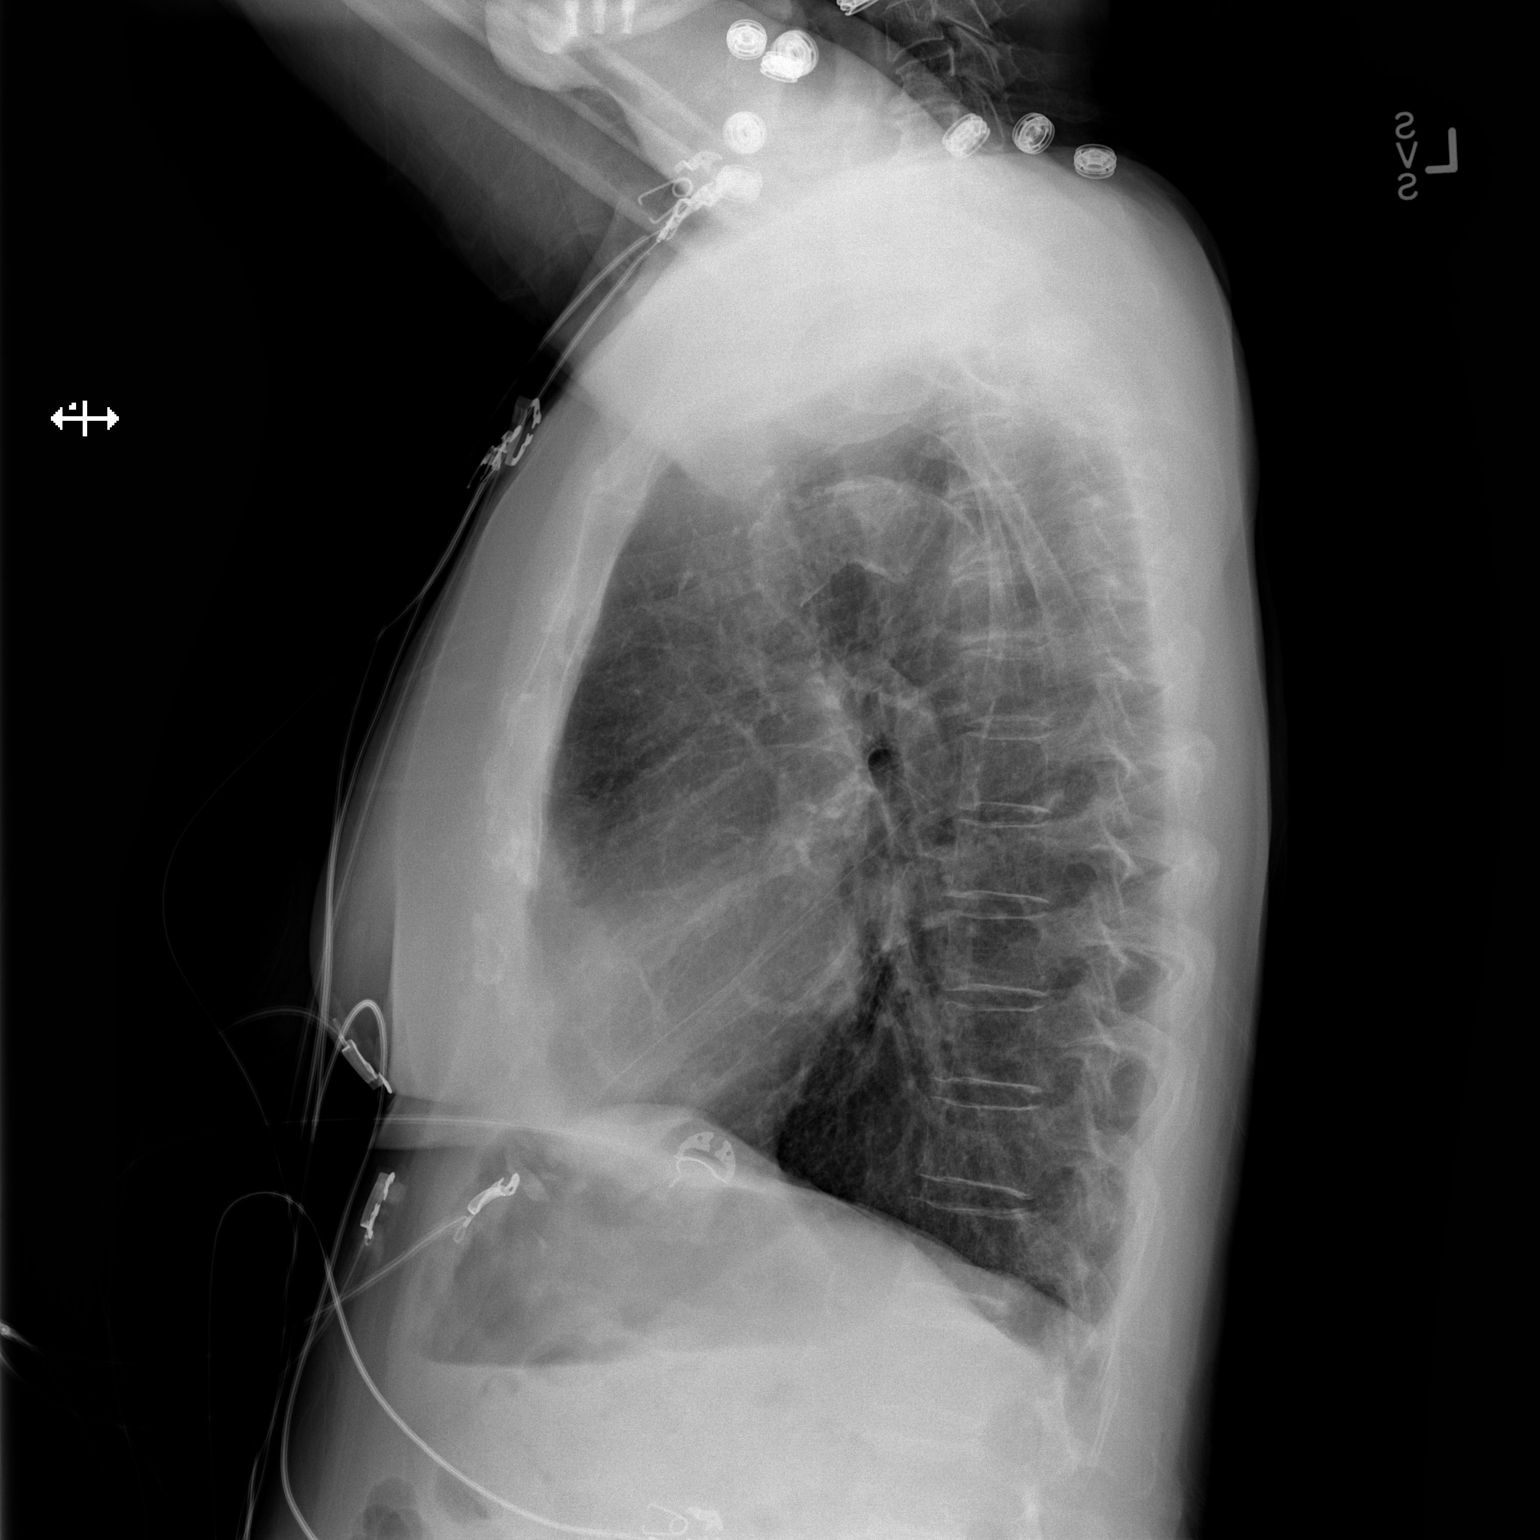

[2 of 2 positions shown; findings below may reference images not displayed]

FINDINGS: The cardiac silhouette is within normal limits for size. Prominent
thoracic aortic calcification is present. The lungs are mildly
hyperinflated. Minimal linear opacity is present in the left lung
base. The lungs are otherwise clear. No pleural effusion or
pneumothorax is seen. No acute osseous abnormality is identified.
IMPRESSION: Minimal scarring or atelectasis in the left lung base. Mild
hyperinflation.

## 2015-01-10 DIAGNOSIS — K219 Gastro-esophageal reflux disease without esophagitis: Secondary | ICD-10-CM | POA: Diagnosis not present

## 2015-01-10 DIAGNOSIS — M1990 Unspecified osteoarthritis, unspecified site: Secondary | ICD-10-CM | POA: Diagnosis not present

## 2015-01-10 DIAGNOSIS — I10 Essential (primary) hypertension: Secondary | ICD-10-CM | POA: Diagnosis not present

## 2015-01-10 DIAGNOSIS — Z6823 Body mass index (BMI) 23.0-23.9, adult: Secondary | ICD-10-CM | POA: Diagnosis not present

## 2015-01-10 DIAGNOSIS — E785 Hyperlipidemia, unspecified: Secondary | ICD-10-CM | POA: Diagnosis not present

## 2015-01-24 DIAGNOSIS — M1711 Unilateral primary osteoarthritis, right knee: Secondary | ICD-10-CM | POA: Diagnosis not present

## 2015-01-24 DIAGNOSIS — M9905 Segmental and somatic dysfunction of pelvic region: Secondary | ICD-10-CM | POA: Diagnosis not present

## 2015-01-24 DIAGNOSIS — M9904 Segmental and somatic dysfunction of sacral region: Secondary | ICD-10-CM | POA: Diagnosis not present

## 2015-01-24 DIAGNOSIS — M25551 Pain in right hip: Secondary | ICD-10-CM | POA: Diagnosis not present

## 2015-01-24 DIAGNOSIS — M9903 Segmental and somatic dysfunction of lumbar region: Secondary | ICD-10-CM | POA: Diagnosis not present

## 2015-01-24 DIAGNOSIS — M25561 Pain in right knee: Secondary | ICD-10-CM | POA: Diagnosis not present

## 2015-01-24 DIAGNOSIS — M5136 Other intervertebral disc degeneration, lumbar region: Secondary | ICD-10-CM | POA: Diagnosis not present

## 2015-01-24 DIAGNOSIS — M791 Myalgia: Secondary | ICD-10-CM | POA: Diagnosis not present

## 2015-01-25 DIAGNOSIS — M5136 Other intervertebral disc degeneration, lumbar region: Secondary | ICD-10-CM | POA: Diagnosis not present

## 2015-01-25 DIAGNOSIS — M1711 Unilateral primary osteoarthritis, right knee: Secondary | ICD-10-CM | POA: Diagnosis not present

## 2015-01-25 DIAGNOSIS — M9903 Segmental and somatic dysfunction of lumbar region: Secondary | ICD-10-CM | POA: Diagnosis not present

## 2015-01-25 DIAGNOSIS — M9905 Segmental and somatic dysfunction of pelvic region: Secondary | ICD-10-CM | POA: Diagnosis not present

## 2015-01-25 DIAGNOSIS — M25561 Pain in right knee: Secondary | ICD-10-CM | POA: Diagnosis not present

## 2015-01-25 DIAGNOSIS — M791 Myalgia: Secondary | ICD-10-CM | POA: Diagnosis not present

## 2015-01-25 DIAGNOSIS — M9904 Segmental and somatic dysfunction of sacral region: Secondary | ICD-10-CM | POA: Diagnosis not present

## 2015-01-25 DIAGNOSIS — M25551 Pain in right hip: Secondary | ICD-10-CM | POA: Diagnosis not present

## 2015-01-26 DIAGNOSIS — M9904 Segmental and somatic dysfunction of sacral region: Secondary | ICD-10-CM | POA: Diagnosis not present

## 2015-01-26 DIAGNOSIS — M5136 Other intervertebral disc degeneration, lumbar region: Secondary | ICD-10-CM | POA: Diagnosis not present

## 2015-01-26 DIAGNOSIS — H31001 Unspecified chorioretinal scars, right eye: Secondary | ICD-10-CM | POA: Diagnosis not present

## 2015-01-26 DIAGNOSIS — M791 Myalgia: Secondary | ICD-10-CM | POA: Diagnosis not present

## 2015-01-26 DIAGNOSIS — H35033 Hypertensive retinopathy, bilateral: Secondary | ICD-10-CM | POA: Diagnosis not present

## 2015-01-26 DIAGNOSIS — M1711 Unilateral primary osteoarthritis, right knee: Secondary | ICD-10-CM | POA: Diagnosis not present

## 2015-01-26 DIAGNOSIS — H43813 Vitreous degeneration, bilateral: Secondary | ICD-10-CM | POA: Diagnosis not present

## 2015-01-26 DIAGNOSIS — H04123 Dry eye syndrome of bilateral lacrimal glands: Secondary | ICD-10-CM | POA: Diagnosis not present

## 2015-01-26 DIAGNOSIS — M25561 Pain in right knee: Secondary | ICD-10-CM | POA: Diagnosis not present

## 2015-01-26 DIAGNOSIS — Z961 Presence of intraocular lens: Secondary | ICD-10-CM | POA: Diagnosis not present

## 2015-01-26 DIAGNOSIS — M25551 Pain in right hip: Secondary | ICD-10-CM | POA: Diagnosis not present

## 2015-01-26 DIAGNOSIS — M9903 Segmental and somatic dysfunction of lumbar region: Secondary | ICD-10-CM | POA: Diagnosis not present

## 2015-01-26 DIAGNOSIS — M9905 Segmental and somatic dysfunction of pelvic region: Secondary | ICD-10-CM | POA: Diagnosis not present

## 2015-01-31 DIAGNOSIS — M5136 Other intervertebral disc degeneration, lumbar region: Secondary | ICD-10-CM | POA: Diagnosis not present

## 2015-01-31 DIAGNOSIS — M791 Myalgia: Secondary | ICD-10-CM | POA: Diagnosis not present

## 2015-01-31 DIAGNOSIS — M25561 Pain in right knee: Secondary | ICD-10-CM | POA: Diagnosis not present

## 2015-01-31 DIAGNOSIS — M9903 Segmental and somatic dysfunction of lumbar region: Secondary | ICD-10-CM | POA: Diagnosis not present

## 2015-01-31 DIAGNOSIS — M25551 Pain in right hip: Secondary | ICD-10-CM | POA: Diagnosis not present

## 2015-01-31 DIAGNOSIS — M1711 Unilateral primary osteoarthritis, right knee: Secondary | ICD-10-CM | POA: Diagnosis not present

## 2015-01-31 DIAGNOSIS — M9904 Segmental and somatic dysfunction of sacral region: Secondary | ICD-10-CM | POA: Diagnosis not present

## 2015-01-31 DIAGNOSIS — M9905 Segmental and somatic dysfunction of pelvic region: Secondary | ICD-10-CM | POA: Diagnosis not present

## 2015-02-01 DIAGNOSIS — M1711 Unilateral primary osteoarthritis, right knee: Secondary | ICD-10-CM | POA: Diagnosis not present

## 2015-02-01 DIAGNOSIS — M25551 Pain in right hip: Secondary | ICD-10-CM | POA: Diagnosis not present

## 2015-02-01 DIAGNOSIS — M9904 Segmental and somatic dysfunction of sacral region: Secondary | ICD-10-CM | POA: Diagnosis not present

## 2015-02-01 DIAGNOSIS — M791 Myalgia: Secondary | ICD-10-CM | POA: Diagnosis not present

## 2015-02-01 DIAGNOSIS — M9903 Segmental and somatic dysfunction of lumbar region: Secondary | ICD-10-CM | POA: Diagnosis not present

## 2015-02-01 DIAGNOSIS — M9905 Segmental and somatic dysfunction of pelvic region: Secondary | ICD-10-CM | POA: Diagnosis not present

## 2015-02-01 DIAGNOSIS — M5136 Other intervertebral disc degeneration, lumbar region: Secondary | ICD-10-CM | POA: Diagnosis not present

## 2015-02-01 DIAGNOSIS — M25561 Pain in right knee: Secondary | ICD-10-CM | POA: Diagnosis not present

## 2015-02-03 DIAGNOSIS — M9904 Segmental and somatic dysfunction of sacral region: Secondary | ICD-10-CM | POA: Diagnosis not present

## 2015-02-03 DIAGNOSIS — M791 Myalgia: Secondary | ICD-10-CM | POA: Diagnosis not present

## 2015-02-03 DIAGNOSIS — M9905 Segmental and somatic dysfunction of pelvic region: Secondary | ICD-10-CM | POA: Diagnosis not present

## 2015-02-03 DIAGNOSIS — M25561 Pain in right knee: Secondary | ICD-10-CM | POA: Diagnosis not present

## 2015-02-03 DIAGNOSIS — M1711 Unilateral primary osteoarthritis, right knee: Secondary | ICD-10-CM | POA: Diagnosis not present

## 2015-02-03 DIAGNOSIS — M25551 Pain in right hip: Secondary | ICD-10-CM | POA: Diagnosis not present

## 2015-02-03 DIAGNOSIS — M5136 Other intervertebral disc degeneration, lumbar region: Secondary | ICD-10-CM | POA: Diagnosis not present

## 2015-02-03 DIAGNOSIS — M9903 Segmental and somatic dysfunction of lumbar region: Secondary | ICD-10-CM | POA: Diagnosis not present

## 2015-02-07 DIAGNOSIS — M5136 Other intervertebral disc degeneration, lumbar region: Secondary | ICD-10-CM | POA: Diagnosis not present

## 2015-02-07 DIAGNOSIS — M9905 Segmental and somatic dysfunction of pelvic region: Secondary | ICD-10-CM | POA: Diagnosis not present

## 2015-02-07 DIAGNOSIS — M791 Myalgia: Secondary | ICD-10-CM | POA: Diagnosis not present

## 2015-02-07 DIAGNOSIS — M1711 Unilateral primary osteoarthritis, right knee: Secondary | ICD-10-CM | POA: Diagnosis not present

## 2015-02-07 DIAGNOSIS — M9903 Segmental and somatic dysfunction of lumbar region: Secondary | ICD-10-CM | POA: Diagnosis not present

## 2015-02-07 DIAGNOSIS — M25551 Pain in right hip: Secondary | ICD-10-CM | POA: Diagnosis not present

## 2015-02-07 DIAGNOSIS — M25561 Pain in right knee: Secondary | ICD-10-CM | POA: Diagnosis not present

## 2015-02-07 DIAGNOSIS — M9904 Segmental and somatic dysfunction of sacral region: Secondary | ICD-10-CM | POA: Diagnosis not present

## 2015-02-09 DIAGNOSIS — M9905 Segmental and somatic dysfunction of pelvic region: Secondary | ICD-10-CM | POA: Diagnosis not present

## 2015-02-09 DIAGNOSIS — M25561 Pain in right knee: Secondary | ICD-10-CM | POA: Diagnosis not present

## 2015-02-09 DIAGNOSIS — M9903 Segmental and somatic dysfunction of lumbar region: Secondary | ICD-10-CM | POA: Diagnosis not present

## 2015-02-09 DIAGNOSIS — M9904 Segmental and somatic dysfunction of sacral region: Secondary | ICD-10-CM | POA: Diagnosis not present

## 2015-02-09 DIAGNOSIS — M791 Myalgia: Secondary | ICD-10-CM | POA: Diagnosis not present

## 2015-02-09 DIAGNOSIS — M5136 Other intervertebral disc degeneration, lumbar region: Secondary | ICD-10-CM | POA: Diagnosis not present

## 2015-02-09 DIAGNOSIS — M1711 Unilateral primary osteoarthritis, right knee: Secondary | ICD-10-CM | POA: Diagnosis not present

## 2015-02-09 DIAGNOSIS — M25551 Pain in right hip: Secondary | ICD-10-CM | POA: Diagnosis not present

## 2015-02-10 ENCOUNTER — Other Ambulatory Visit: Payer: Self-pay | Admitting: Orthopedic Surgery

## 2015-02-10 DIAGNOSIS — M25561 Pain in right knee: Secondary | ICD-10-CM | POA: Diagnosis not present

## 2015-02-10 DIAGNOSIS — G8929 Other chronic pain: Secondary | ICD-10-CM | POA: Diagnosis not present

## 2015-02-10 DIAGNOSIS — M1711 Unilateral primary osteoarthritis, right knee: Secondary | ICD-10-CM | POA: Diagnosis not present

## 2015-02-11 ENCOUNTER — Ambulatory Visit
Admission: RE | Admit: 2015-02-11 | Discharge: 2015-02-11 | Disposition: A | Discharge status: Home / Self Care | Payer: Medicare Other | Source: Ambulatory Visit | Attending: Orthopedic Surgery | Admitting: Orthopedic Surgery

## 2015-02-11 DIAGNOSIS — M23241 Derangement of anterior horn of lateral meniscus due to old tear or injury, right knee: Secondary | ICD-10-CM | POA: Diagnosis not present

## 2015-02-11 DIAGNOSIS — M25561 Pain in right knee: Secondary | ICD-10-CM

## 2015-02-15 DIAGNOSIS — M1711 Unilateral primary osteoarthritis, right knee: Secondary | ICD-10-CM | POA: Diagnosis not present

## 2015-02-15 DIAGNOSIS — S83206A Unspecified tear of unspecified meniscus, current injury, right knee, initial encounter: Secondary | ICD-10-CM | POA: Diagnosis not present

## 2015-02-15 DIAGNOSIS — G8929 Other chronic pain: Secondary | ICD-10-CM | POA: Diagnosis not present

## 2015-02-15 DIAGNOSIS — S83207A Unspecified tear of unspecified meniscus, current injury, left knee, initial encounter: Secondary | ICD-10-CM | POA: Diagnosis not present

## 2015-03-07 DIAGNOSIS — Z85828 Personal history of other malignant neoplasm of skin: Secondary | ICD-10-CM | POA: Diagnosis not present

## 2015-03-07 DIAGNOSIS — Z08 Encounter for follow-up examination after completed treatment for malignant neoplasm: Secondary | ICD-10-CM | POA: Diagnosis not present

## 2015-03-07 DIAGNOSIS — L821 Other seborrheic keratosis: Secondary | ICD-10-CM | POA: Diagnosis not present

## 2015-03-07 DIAGNOSIS — B079 Viral wart, unspecified: Secondary | ICD-10-CM | POA: Diagnosis not present

## 2015-03-07 DIAGNOSIS — L814 Other melanin hyperpigmentation: Secondary | ICD-10-CM | POA: Diagnosis not present

## 2015-03-10 IMAGING — CR DG CHEST 2V
2 series · 2 of 2 positions shown · non-contrast
Comparison: Chest radiograph performed 09/02/2013

CLINICAL DATA: Shortness of breath and mid chest pain.

EXAM:
CHEST  2 VIEW

[w chest pa]
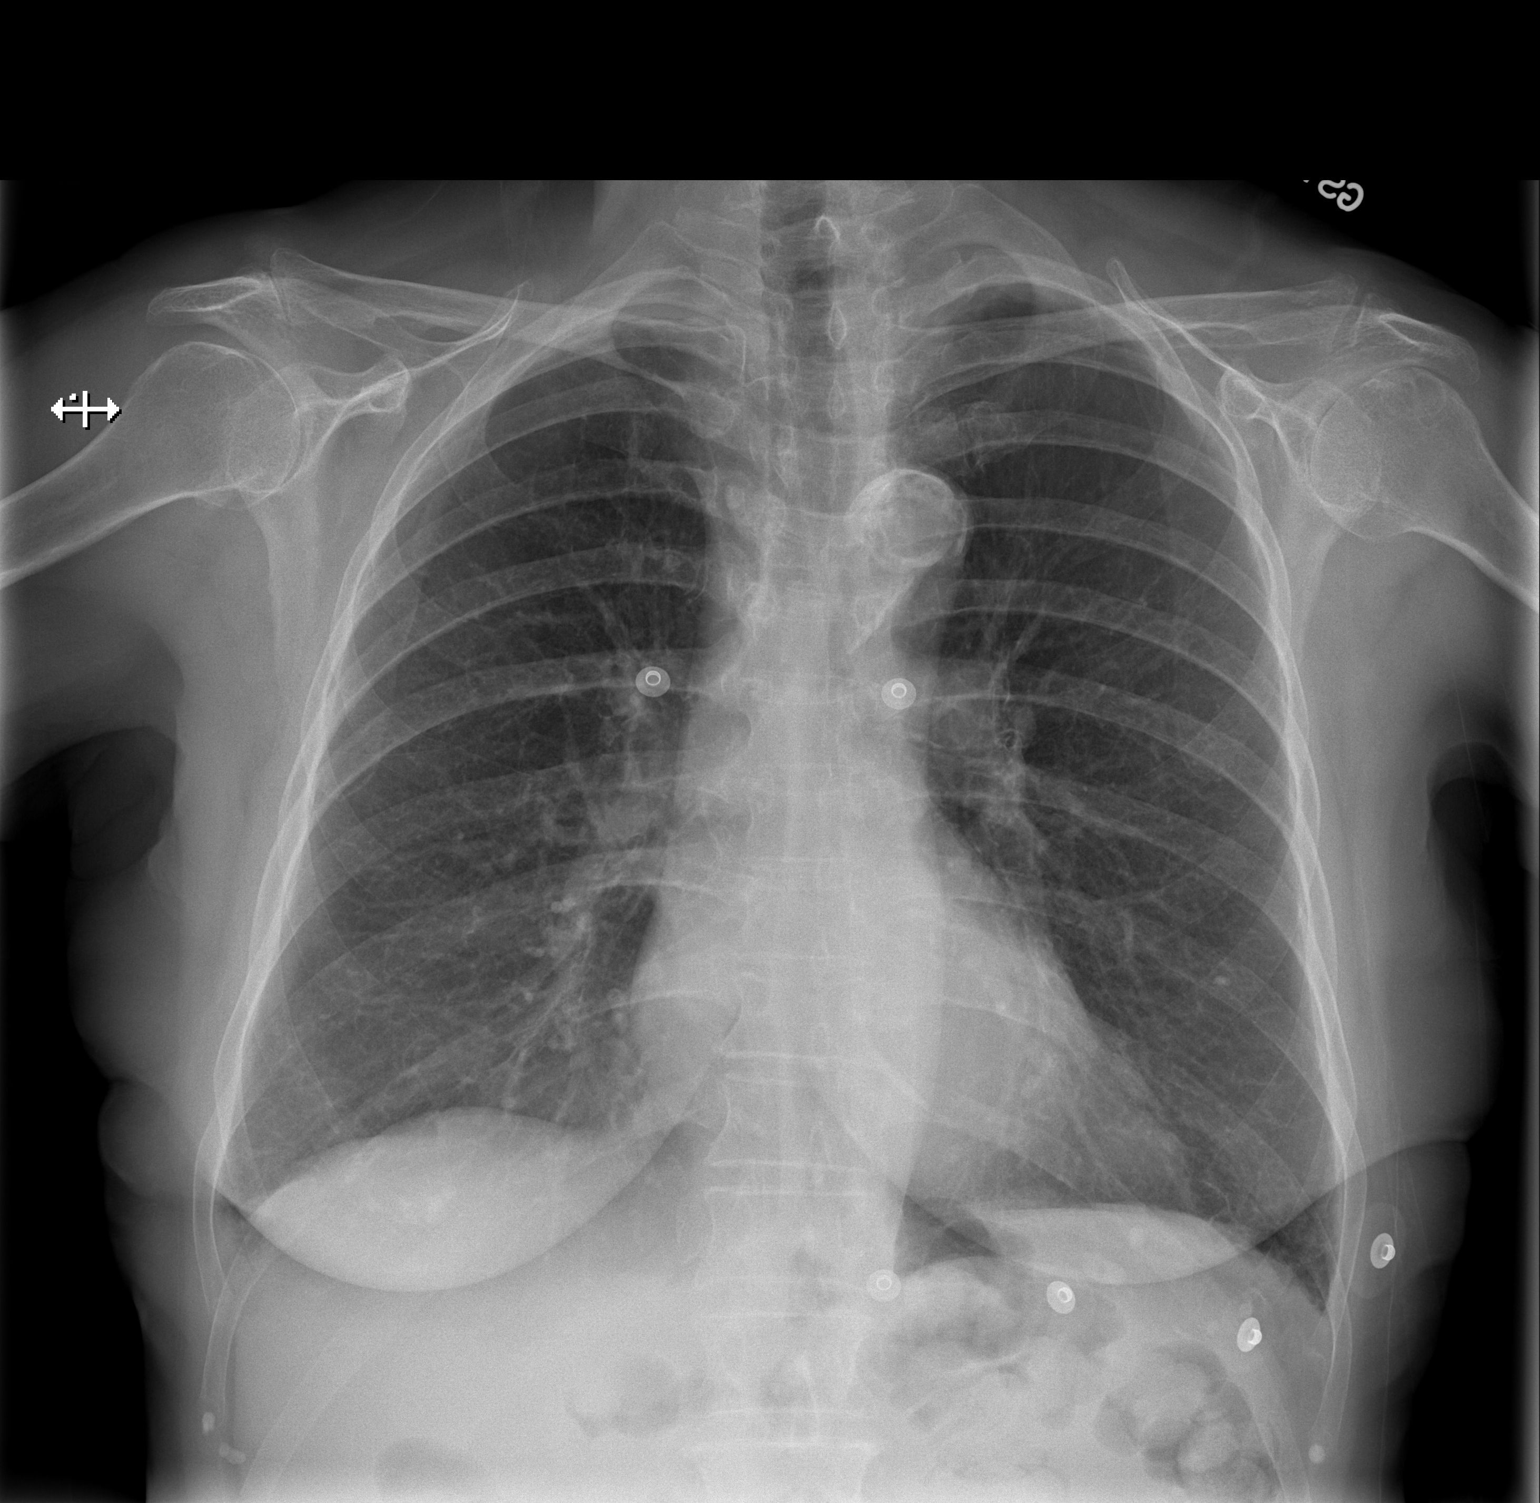

[w chest lat]
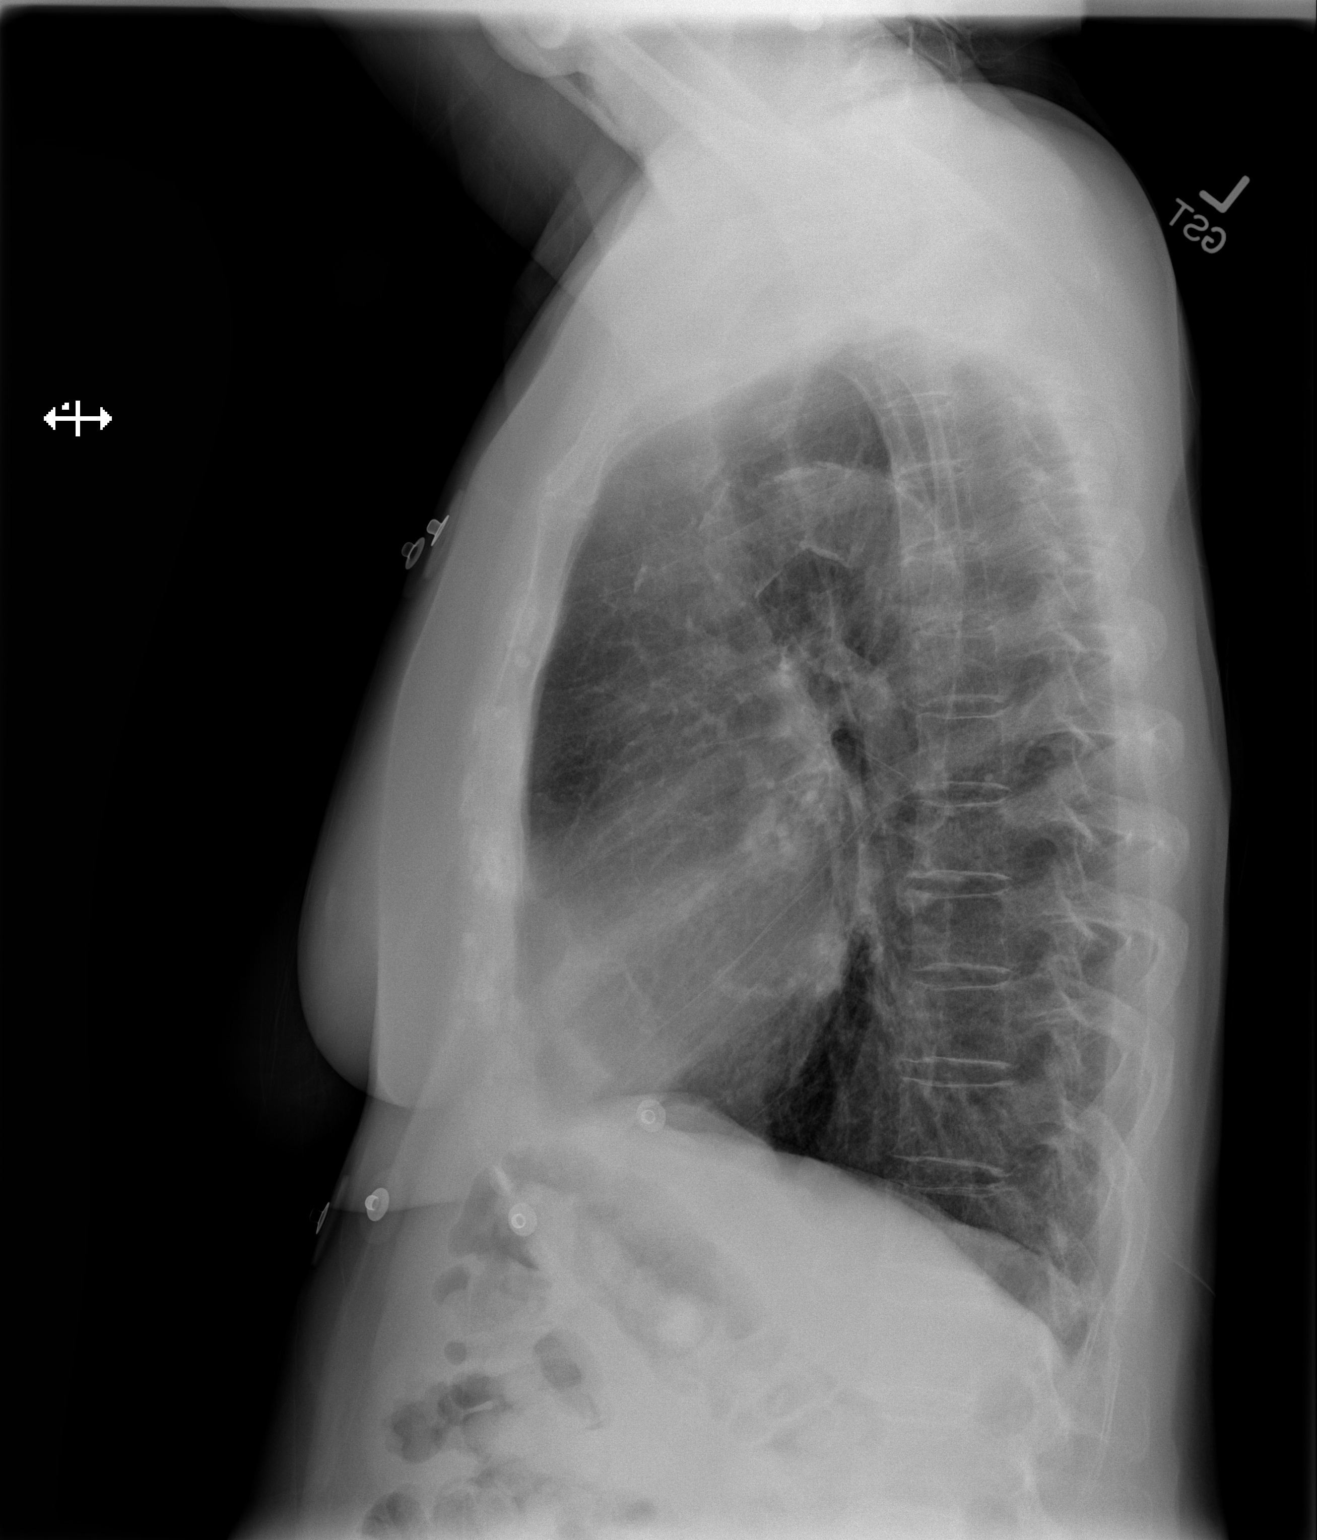

[2 of 2 positions shown; findings below may reference images not displayed]

FINDINGS: The lungs are well-aerated and clear. There is no evidence of focal
opacification, pleural effusion or pneumothorax. A small calcified
granuloma is noted at the left midlung zone.

The heart is normal in size; the mediastinal contour is within
normal limits. Calcification is seen along the aortic arch. No acute
osseous abnormalities are seen.
IMPRESSION: No acute cardiopulmonary process seen.

## 2015-03-22 DIAGNOSIS — Z23 Encounter for immunization: Secondary | ICD-10-CM | POA: Diagnosis not present

## 2015-04-19 ENCOUNTER — Encounter: Payer: Self-pay | Admitting: Obstetrics & Gynecology

## 2015-04-19 ENCOUNTER — Ambulatory Visit (INDEPENDENT_AMBULATORY_CARE_PROVIDER_SITE_OTHER): Payer: Medicare Other | Admitting: Obstetrics & Gynecology

## 2015-04-19 ENCOUNTER — Ambulatory Visit: Payer: Medicare Other | Admitting: Obstetrics & Gynecology

## 2015-04-19 VITALS — BP 138/62 | HR 84 | Resp 16 | Ht 59.5 in | Wt 121.0 lb

## 2015-04-19 DIAGNOSIS — Z124 Encounter for screening for malignant neoplasm of cervix: Secondary | ICD-10-CM

## 2015-04-19 DIAGNOSIS — N83201 Unspecified ovarian cyst, right side: Secondary | ICD-10-CM

## 2015-04-19 DIAGNOSIS — Z01419 Encounter for gynecological examination (general) (routine) without abnormal findings: Secondary | ICD-10-CM | POA: Diagnosis not present

## 2015-04-19 DIAGNOSIS — N83202 Unspecified ovarian cyst, left side: Secondary | ICD-10-CM | POA: Diagnosis not present

## 2015-04-19 DIAGNOSIS — Z974 Presence of external hearing-aid: Secondary | ICD-10-CM | POA: Diagnosis not present

## 2015-04-19 NOTE — Progress Notes (Signed)
Patient scheduled for Pelvic ultrasound with Dr. Sabra Heck, requests Tuesday appointments. Scheduled for 05/03/15 at 1430 with 1500 consult.

## 2015-04-19 NOTE — Progress Notes (Signed)
81 y.o. G2P2 WidowedCaucasianF here for annual exam.  Doing well except for her right knee.  She reports that she fell and has "tears" in it.  She has received one steroid injection.  Is trying not to have surgery.    Denies vaginal bleeding.     Patient's last menstrual period was 06/11/1976.          Sexually active: No.  The current method of family planning is post menopausal status.   Exercising: Yes.    some walking right now, right knee meniscus tear Smoker:  Only in her 33s  Health Maintenance: Pap:  12/30/12 WNL History of abnormal Pap:  no MMG:  07/08/14 BiRads 1-negative Colonoscopy:  2/08, 6/14 endoscopy Dr Craig Staggers and esophagus stretched BMD:   3/10 TDaP:  12/08 Screening Labs: PCP, Hb today: PCP, Urine today: PCP   reports that she has quit smoking. She has never used smokeless tobacco. She reports that she drinks about 1.8 oz of alcohol per week. She reports that she does not use illicit drugs.  Past Medical History  Diagnosis Date  . Hyperlipidemia   . Hypertension   . Osteopenia   . PAC (premature atrial contraction)   . Mastodynia   . Ovarian cyst     h/o  . Diverticulitis   . Stomach ulcer 11/10/12    dx with endoscopy, will repeat in 8/14 and will stretch esophagus  . Broken ankle   . Meniscus tear     trying cortisone injections with Dr Ronnie Derby, right leg    Past Surgical History  Procedure Laterality Date  . Back surgery  1978  . Ankle surgery  1987  . Cataracts    . Mouth surgery      implants    Current Outpatient Prescriptions  Medication Sig Dispense Refill  . acetaminophen (TYLENOL) 500 MG tablet Take 1,000 mg by mouth as needed for pain.     Marland Kitchen atorvastatin (LIPITOR) 20 MG tablet Take 20 mg by mouth daily at 6 PM.    . calcium-vitamin D (OSCAL WITH D) 500-200 MG-UNIT per tablet Take 1 tablet by mouth 2 (two) times daily.    . cholecalciferol (VITAMIN D) 1000 UNITS tablet Take 1,000 Units by mouth daily at 6 PM.     . FLUZONE HIGH-DOSE 0.5  ML SUSY inject 0.5 milliliter intramuscularly  0  . LORazepam (ATIVAN) 0.5 MG tablet Take 0.5 mg by mouth as needed for anxiety.    . magnesium gluconate (MAGONATE) 500 MG tablet Take 500 mg by mouth 2 (two) times daily.    Marland Kitchen omeprazole (PRILOSEC) 40 MG capsule Take 40 mg by mouth every morning.     Vladimir Faster Glycol-Propyl Glycol (SYSTANE OP) Place 1 drop into both eyes daily as needed (for dry eyes).    . Probiotic Product (PROBIOTIC DAILY PO) Take 1 tablet by mouth every morning.    . sodium chloride (OCEAN) 0.65 % SOLN nasal spray Place 1 spray into both nostrils daily at 6 PM.    . triamcinolone (NASACORT ALLERGY 24HR) 55 MCG/ACT AERO nasal inhaler Place 2 sprays into both nostrils every morning.    . triamterene-hydrochlorothiazide (MAXZIDE) 75-50 MG per tablet Take 1 tablet by mouth daily.      . verapamil (CALAN-SR) 240 MG CR tablet Take 240 mg by mouth daily.     . traMADol (ULTRAM) 50 MG tablet As needed  0   No current facility-administered medications for this visit.    Family History  Problem  Relation Age of Onset  . Coronary artery disease Father   . Heart attack Father   . Heart failure Mother   . Colon cancer Brother     ROS:  Pertinent items are noted in HPI.  Otherwise, a comprehensive ROS was negative.  Exam:   BP 138/62 mmHg  Pulse 84  Resp 16  Ht 4' 11.5" (1.511 m)  Wt 121 lb (54.885 kg)  BMI 24.04 kg/m2  LMP 06/11/1976  Weight change:  Height: 4' 11.5" (151.1 cm)  Ht Readings from Last 3 Encounters:  04/19/15 4' 11.5" (1.511 m)  03/03/14 4' 11.5" (1.511 m)  11/30/13 5' (1.524 m)    General appearance: alert, cooperative and appears stated age Head: Normocephalic, without obvious abnormality, atraumatic Neck: no adenopathy, supple, symmetrical, trachea midline and thyroid normal to inspection and palpation Lungs: clear to auscultation bilaterally Breasts: normal appearance, no masses or tenderness Heart: regular rate and rhythm Abdomen: soft,  non-tender; bowel sounds normal; no masses,  no organomegaly Extremities: extremities normal, atraumatic, no cyanosis or edema Skin: Skin color, texture, turgor normal. No rashes or lesions Lymph nodes: Cervical, supraclavicular, and axillary nodes normal. No abnormal inguinal nodes palpated Neurologic: Grossly normal   Pelvic: External genitalia:  no lesions              Urethra:  normal appearing urethra with no masses, tenderness or lesions              Bartholins and Skenes: normal                 Vagina: normal appearing vagina with normal color and discharge, no lesions              Cervix: no lesions              Pap taken: Yes.   Bimanual Exam:  Uterus:  normal              Adnexa: normal adnexa and no mass, fullness, tenderness               Rectovaginal: Confirms               Anus:  normal sphincter tone, no lesions  Chaperone was present for exam.  A:  Well Woman with normal exam PMP, No HRT H/o ovarian cysts (last u/s 1/13) Hypertension Elevated lipids Recent knee injury  P: Mammogram screening discussed.  Pt is going to start doing this every other year. Pap smear obtained today.  Pt and I reviewed guidelines.  She desires to continue pap smears.  Pt declines having another BMD.  I am agreement with this decision. Sees Dr. Reynaldo Minium, PCP, every 6 months. Name given for ortho appt. return annually or prn

## 2015-04-20 LAB — IPS PAP SMEAR ONLY

## 2015-04-21 ENCOUNTER — Telehealth: Payer: Self-pay | Admitting: Obstetrics & Gynecology

## 2015-04-21 NOTE — Telephone Encounter (Signed)
Call to patient and rescheduled appointment to 05/17/15 at 1430 for Pelvic ultrasound and consult with Dr. Sabra Heck.  Patient feels waiting until December will be better for her schedule. She is advised to return call with any concerns prior to appointment and aware of ultrasound cancellation policy.  Routing to provider for final review. Patient agreeable to disposition. Will close encounter.

## 2015-04-21 NOTE — Telephone Encounter (Signed)
Forwarding to triage to call and reschedule PUS for pt.

## 2015-04-21 NOTE — Telephone Encounter (Signed)
Patient canceled her up coming PUS/Consult appointment 05/03/15. Patient would like to reschedule in December 2016.

## 2015-05-03 ENCOUNTER — Other Ambulatory Visit: Payer: BLUE CROSS/BLUE SHIELD | Admitting: Obstetrics & Gynecology

## 2015-05-03 ENCOUNTER — Other Ambulatory Visit: Payer: Medicare Other

## 2015-05-04 DIAGNOSIS — M1710 Unilateral primary osteoarthritis, unspecified knee: Secondary | ICD-10-CM | POA: Insufficient documentation

## 2015-05-04 DIAGNOSIS — M19172 Post-traumatic osteoarthritis, left ankle and foot: Secondary | ICD-10-CM | POA: Diagnosis not present

## 2015-05-04 DIAGNOSIS — M19079 Primary osteoarthritis, unspecified ankle and foot: Secondary | ICD-10-CM | POA: Insufficient documentation

## 2015-05-04 DIAGNOSIS — M179 Osteoarthritis of knee, unspecified: Secondary | ICD-10-CM | POA: Insufficient documentation

## 2015-05-04 DIAGNOSIS — M1711 Unilateral primary osteoarthritis, right knee: Secondary | ICD-10-CM | POA: Diagnosis not present

## 2015-05-17 ENCOUNTER — Ambulatory Visit (INDEPENDENT_AMBULATORY_CARE_PROVIDER_SITE_OTHER): Payer: Medicare Other

## 2015-05-17 ENCOUNTER — Ambulatory Visit (INDEPENDENT_AMBULATORY_CARE_PROVIDER_SITE_OTHER): Payer: Medicare Other | Admitting: Obstetrics & Gynecology

## 2015-05-17 VITALS — BP 142/76 | HR 72 | Resp 16 | Ht 59.5 in | Wt 119.0 lb

## 2015-05-17 DIAGNOSIS — N83201 Unspecified ovarian cyst, right side: Secondary | ICD-10-CM | POA: Diagnosis not present

## 2015-05-17 DIAGNOSIS — N83202 Unspecified ovarian cyst, left side: Secondary | ICD-10-CM | POA: Diagnosis not present

## 2015-05-17 NOTE — Progress Notes (Signed)
81 y.o. G88P2 Widowed White female here for a pelvic ultrasound due to history of simple appearing ovarian cysts.  I have never been able to feel these on exam and her visit in November was the same way.  She has no pain.  She has become more anxious about these cysts.  Last PUS was 1/13 and pt is here for repeat PUS today.  Denies vaginal bleeding.   Patient's last menstrual period was 06/11/1976.  Sexually active:  yes  Contraception: PMP status  FINDINGS: UTERUS: 4.8 x 4.0 x 2.1cm EMS: 2.12mm ADNEXA:   Left ovary 2.0 x 1.4 x 1.5cm with two simple cystic findings, 1.3cm and 0.8cm.  Unchanged since 2013.     Right ovary is primarily cystic 3.4 x 3.2cm, simple in appearance and avascular.  Small cystic areas along wall unchanged.  Ovary appears stable since 2013. CUL DE SAC: no free fluid  Reviewed current images with pt and prior images.  Although cysts did slowly change over time, there is stability since 2013.  Do not feel she needs to repeat any ultrasound unless there is a new change as this has been followed almost 10 years.  Reassured pt that there are no ultrasound findings that are worrisome for cancer.  Surgery is not needed for treatment.  Assessment:  Bilateral, simple ovarian cysts, stable since 2013  Plan: Return for routine exam in 2017 but repeat ultrasound not needed unless she has new symptoms.    ~15 minutes spent with patient, all of time was in face to face discussion of above.

## 2015-05-23 DIAGNOSIS — S83282S Other tear of lateral meniscus, current injury, left knee, sequela: Secondary | ICD-10-CM | POA: Diagnosis not present

## 2015-05-23 DIAGNOSIS — M25562 Pain in left knee: Secondary | ICD-10-CM | POA: Diagnosis not present

## 2015-07-11 DIAGNOSIS — R8290 Unspecified abnormal findings in urine: Secondary | ICD-10-CM | POA: Diagnosis not present

## 2015-07-11 DIAGNOSIS — I10 Essential (primary) hypertension: Secondary | ICD-10-CM | POA: Diagnosis not present

## 2015-07-11 DIAGNOSIS — E784 Other hyperlipidemia: Secondary | ICD-10-CM | POA: Diagnosis not present

## 2015-07-11 DIAGNOSIS — N390 Urinary tract infection, site not specified: Secondary | ICD-10-CM | POA: Diagnosis not present

## 2015-07-18 DIAGNOSIS — I10 Essential (primary) hypertension: Secondary | ICD-10-CM | POA: Diagnosis not present

## 2015-07-18 DIAGNOSIS — E784 Other hyperlipidemia: Secondary | ICD-10-CM | POA: Diagnosis not present

## 2015-07-18 DIAGNOSIS — Z0000 Encounter for general adult medical examination without abnormal findings: Secondary | ICD-10-CM | POA: Diagnosis not present

## 2015-07-18 DIAGNOSIS — J309 Allergic rhinitis, unspecified: Secondary | ICD-10-CM | POA: Diagnosis not present

## 2015-07-18 DIAGNOSIS — Z6822 Body mass index (BMI) 22.0-22.9, adult: Secondary | ICD-10-CM | POA: Diagnosis not present

## 2015-07-18 DIAGNOSIS — Z1389 Encounter for screening for other disorder: Secondary | ICD-10-CM | POA: Diagnosis not present

## 2015-07-18 DIAGNOSIS — M1990 Unspecified osteoarthritis, unspecified site: Secondary | ICD-10-CM | POA: Diagnosis not present

## 2015-07-18 DIAGNOSIS — K219 Gastro-esophageal reflux disease without esophagitis: Secondary | ICD-10-CM | POA: Diagnosis not present

## 2015-08-10 DIAGNOSIS — M25561 Pain in right knee: Secondary | ICD-10-CM | POA: Diagnosis not present

## 2015-08-10 DIAGNOSIS — G8929 Other chronic pain: Secondary | ICD-10-CM | POA: Diagnosis not present

## 2015-09-05 DIAGNOSIS — S336XXA Sprain of sacroiliac joint, initial encounter: Secondary | ICD-10-CM | POA: Diagnosis not present

## 2015-09-05 DIAGNOSIS — M438X9 Other specified deforming dorsopathies, site unspecified: Secondary | ICD-10-CM | POA: Diagnosis not present

## 2015-09-05 DIAGNOSIS — S83419A Sprain of medial collateral ligament of unspecified knee, initial encounter: Secondary | ICD-10-CM | POA: Diagnosis not present

## 2015-09-05 DIAGNOSIS — M9903 Segmental and somatic dysfunction of lumbar region: Secondary | ICD-10-CM | POA: Diagnosis not present

## 2015-09-05 DIAGNOSIS — M9906 Segmental and somatic dysfunction of lower extremity: Secondary | ICD-10-CM | POA: Diagnosis not present

## 2015-09-05 DIAGNOSIS — M9902 Segmental and somatic dysfunction of thoracic region: Secondary | ICD-10-CM | POA: Diagnosis not present

## 2015-09-05 DIAGNOSIS — M5126 Other intervertebral disc displacement, lumbar region: Secondary | ICD-10-CM | POA: Diagnosis not present

## 2015-09-05 DIAGNOSIS — M9904 Segmental and somatic dysfunction of sacral region: Secondary | ICD-10-CM | POA: Diagnosis not present

## 2015-09-07 DIAGNOSIS — M438X9 Other specified deforming dorsopathies, site unspecified: Secondary | ICD-10-CM | POA: Diagnosis not present

## 2015-09-07 DIAGNOSIS — M5126 Other intervertebral disc displacement, lumbar region: Secondary | ICD-10-CM | POA: Diagnosis not present

## 2015-09-07 DIAGNOSIS — M9903 Segmental and somatic dysfunction of lumbar region: Secondary | ICD-10-CM | POA: Diagnosis not present

## 2015-09-07 DIAGNOSIS — S83419A Sprain of medial collateral ligament of unspecified knee, initial encounter: Secondary | ICD-10-CM | POA: Diagnosis not present

## 2015-09-07 DIAGNOSIS — M9906 Segmental and somatic dysfunction of lower extremity: Secondary | ICD-10-CM | POA: Diagnosis not present

## 2015-09-07 DIAGNOSIS — M9902 Segmental and somatic dysfunction of thoracic region: Secondary | ICD-10-CM | POA: Diagnosis not present

## 2015-09-07 DIAGNOSIS — M9904 Segmental and somatic dysfunction of sacral region: Secondary | ICD-10-CM | POA: Diagnosis not present

## 2015-09-07 DIAGNOSIS — S336XXA Sprain of sacroiliac joint, initial encounter: Secondary | ICD-10-CM | POA: Diagnosis not present

## 2015-09-09 DIAGNOSIS — M9906 Segmental and somatic dysfunction of lower extremity: Secondary | ICD-10-CM | POA: Diagnosis not present

## 2015-09-09 DIAGNOSIS — M9903 Segmental and somatic dysfunction of lumbar region: Secondary | ICD-10-CM | POA: Diagnosis not present

## 2015-09-09 DIAGNOSIS — M9904 Segmental and somatic dysfunction of sacral region: Secondary | ICD-10-CM | POA: Diagnosis not present

## 2015-09-09 DIAGNOSIS — M9902 Segmental and somatic dysfunction of thoracic region: Secondary | ICD-10-CM | POA: Diagnosis not present

## 2015-09-09 DIAGNOSIS — S336XXA Sprain of sacroiliac joint, initial encounter: Secondary | ICD-10-CM | POA: Diagnosis not present

## 2015-09-09 DIAGNOSIS — M5126 Other intervertebral disc displacement, lumbar region: Secondary | ICD-10-CM | POA: Diagnosis not present

## 2015-09-09 DIAGNOSIS — M438X9 Other specified deforming dorsopathies, site unspecified: Secondary | ICD-10-CM | POA: Diagnosis not present

## 2015-09-09 DIAGNOSIS — S83419A Sprain of medial collateral ligament of unspecified knee, initial encounter: Secondary | ICD-10-CM | POA: Diagnosis not present

## 2015-09-21 ENCOUNTER — Other Ambulatory Visit: Payer: Self-pay

## 2015-09-21 DIAGNOSIS — Z1231 Encounter for screening mammogram for malignant neoplasm of breast: Secondary | ICD-10-CM

## 2015-10-04 ENCOUNTER — Ambulatory Visit
Admission: RE | Admit: 2015-10-04 | Discharge: 2015-10-04 | Disposition: A | Discharge status: Home / Self Care | Payer: Medicare Other | Source: Ambulatory Visit

## 2015-10-04 DIAGNOSIS — B078 Other viral warts: Secondary | ICD-10-CM | POA: Diagnosis not present

## 2015-10-04 DIAGNOSIS — Z1231 Encounter for screening mammogram for malignant neoplasm of breast: Secondary | ICD-10-CM

## 2015-10-04 DIAGNOSIS — Z85828 Personal history of other malignant neoplasm of skin: Secondary | ICD-10-CM | POA: Diagnosis not present

## 2015-10-24 DIAGNOSIS — H903 Sensorineural hearing loss, bilateral: Secondary | ICD-10-CM | POA: Diagnosis not present

## 2015-11-15 DIAGNOSIS — M1711 Unilateral primary osteoarthritis, right knee: Secondary | ICD-10-CM | POA: Diagnosis not present

## 2015-12-06 DIAGNOSIS — H9012 Conductive hearing loss, unilateral, left ear, with unrestricted hearing on the contralateral side: Secondary | ICD-10-CM | POA: Diagnosis not present

## 2015-12-06 DIAGNOSIS — I10 Essential (primary) hypertension: Secondary | ICD-10-CM | POA: Diagnosis not present

## 2015-12-06 DIAGNOSIS — H9113 Presbycusis, bilateral: Secondary | ICD-10-CM | POA: Diagnosis not present

## 2015-12-06 DIAGNOSIS — I951 Orthostatic hypotension: Secondary | ICD-10-CM | POA: Diagnosis not present

## 2015-12-19 DIAGNOSIS — S336XXA Sprain of sacroiliac joint, initial encounter: Secondary | ICD-10-CM | POA: Diagnosis not present

## 2015-12-19 DIAGNOSIS — S83419A Sprain of medial collateral ligament of unspecified knee, initial encounter: Secondary | ICD-10-CM | POA: Diagnosis not present

## 2015-12-19 DIAGNOSIS — M9902 Segmental and somatic dysfunction of thoracic region: Secondary | ICD-10-CM | POA: Diagnosis not present

## 2015-12-19 DIAGNOSIS — M438X9 Other specified deforming dorsopathies, site unspecified: Secondary | ICD-10-CM | POA: Diagnosis not present

## 2015-12-19 DIAGNOSIS — M9906 Segmental and somatic dysfunction of lower extremity: Secondary | ICD-10-CM | POA: Diagnosis not present

## 2015-12-19 DIAGNOSIS — M9904 Segmental and somatic dysfunction of sacral region: Secondary | ICD-10-CM | POA: Diagnosis not present

## 2015-12-19 DIAGNOSIS — M9903 Segmental and somatic dysfunction of lumbar region: Secondary | ICD-10-CM | POA: Diagnosis not present

## 2015-12-19 DIAGNOSIS — M5126 Other intervertebral disc displacement, lumbar region: Secondary | ICD-10-CM | POA: Diagnosis not present

## 2015-12-21 DIAGNOSIS — M9903 Segmental and somatic dysfunction of lumbar region: Secondary | ICD-10-CM | POA: Diagnosis not present

## 2015-12-21 DIAGNOSIS — M5126 Other intervertebral disc displacement, lumbar region: Secondary | ICD-10-CM | POA: Diagnosis not present

## 2015-12-21 DIAGNOSIS — M9906 Segmental and somatic dysfunction of lower extremity: Secondary | ICD-10-CM | POA: Diagnosis not present

## 2015-12-21 DIAGNOSIS — M9902 Segmental and somatic dysfunction of thoracic region: Secondary | ICD-10-CM | POA: Diagnosis not present

## 2015-12-21 DIAGNOSIS — M438X9 Other specified deforming dorsopathies, site unspecified: Secondary | ICD-10-CM | POA: Diagnosis not present

## 2015-12-21 DIAGNOSIS — S336XXA Sprain of sacroiliac joint, initial encounter: Secondary | ICD-10-CM | POA: Diagnosis not present

## 2015-12-21 DIAGNOSIS — S83419A Sprain of medial collateral ligament of unspecified knee, initial encounter: Secondary | ICD-10-CM | POA: Diagnosis not present

## 2015-12-21 DIAGNOSIS — M9904 Segmental and somatic dysfunction of sacral region: Secondary | ICD-10-CM | POA: Diagnosis not present

## 2016-01-23 DIAGNOSIS — I129 Hypertensive chronic kidney disease with stage 1 through stage 4 chronic kidney disease, or unspecified chronic kidney disease: Secondary | ICD-10-CM | POA: Diagnosis not present

## 2016-01-23 DIAGNOSIS — I10 Essential (primary) hypertension: Secondary | ICD-10-CM | POA: Diagnosis not present

## 2016-01-23 DIAGNOSIS — N183 Chronic kidney disease, stage 3 (moderate): Secondary | ICD-10-CM | POA: Diagnosis not present

## 2016-01-23 DIAGNOSIS — E784 Other hyperlipidemia: Secondary | ICD-10-CM | POA: Diagnosis not present

## 2016-01-23 DIAGNOSIS — M1990 Unspecified osteoarthritis, unspecified site: Secondary | ICD-10-CM | POA: Diagnosis not present

## 2016-01-23 DIAGNOSIS — K219 Gastro-esophageal reflux disease without esophagitis: Secondary | ICD-10-CM | POA: Diagnosis not present

## 2016-01-23 DIAGNOSIS — Z6822 Body mass index (BMI) 22.0-22.9, adult: Secondary | ICD-10-CM | POA: Diagnosis not present

## 2016-02-02 DIAGNOSIS — H04123 Dry eye syndrome of bilateral lacrimal glands: Secondary | ICD-10-CM | POA: Diagnosis not present

## 2016-02-02 DIAGNOSIS — H35033 Hypertensive retinopathy, bilateral: Secondary | ICD-10-CM | POA: Diagnosis not present

## 2016-02-02 DIAGNOSIS — Z961 Presence of intraocular lens: Secondary | ICD-10-CM | POA: Diagnosis not present

## 2016-02-17 DIAGNOSIS — M1711 Unilateral primary osteoarthritis, right knee: Secondary | ICD-10-CM | POA: Diagnosis not present

## 2016-03-05 DIAGNOSIS — D235 Other benign neoplasm of skin of trunk: Secondary | ICD-10-CM | POA: Diagnosis not present

## 2016-03-05 DIAGNOSIS — L905 Scar conditions and fibrosis of skin: Secondary | ICD-10-CM | POA: Diagnosis not present

## 2016-03-05 DIAGNOSIS — L570 Actinic keratosis: Secondary | ICD-10-CM | POA: Diagnosis not present

## 2016-03-05 DIAGNOSIS — L821 Other seborrheic keratosis: Secondary | ICD-10-CM | POA: Diagnosis not present

## 2016-03-05 DIAGNOSIS — D1801 Hemangioma of skin and subcutaneous tissue: Secondary | ICD-10-CM | POA: Diagnosis not present

## 2016-03-20 DIAGNOSIS — Z23 Encounter for immunization: Secondary | ICD-10-CM | POA: Diagnosis not present

## 2016-04-04 DIAGNOSIS — S83419A Sprain of medial collateral ligament of unspecified knee, initial encounter: Secondary | ICD-10-CM | POA: Diagnosis not present

## 2016-04-04 DIAGNOSIS — M9903 Segmental and somatic dysfunction of lumbar region: Secondary | ICD-10-CM | POA: Diagnosis not present

## 2016-04-04 DIAGNOSIS — M9904 Segmental and somatic dysfunction of sacral region: Secondary | ICD-10-CM | POA: Diagnosis not present

## 2016-04-04 DIAGNOSIS — S336XXA Sprain of sacroiliac joint, initial encounter: Secondary | ICD-10-CM | POA: Diagnosis not present

## 2016-04-04 DIAGNOSIS — M5126 Other intervertebral disc displacement, lumbar region: Secondary | ICD-10-CM | POA: Diagnosis not present

## 2016-04-04 DIAGNOSIS — M438X9 Other specified deforming dorsopathies, site unspecified: Secondary | ICD-10-CM | POA: Diagnosis not present

## 2016-04-04 DIAGNOSIS — M9902 Segmental and somatic dysfunction of thoracic region: Secondary | ICD-10-CM | POA: Diagnosis not present

## 2016-04-04 DIAGNOSIS — M9906 Segmental and somatic dysfunction of lower extremity: Secondary | ICD-10-CM | POA: Diagnosis not present

## 2016-05-21 ENCOUNTER — Encounter: Payer: Self-pay | Admitting: Obstetrics & Gynecology

## 2016-05-21 ENCOUNTER — Ambulatory Visit (INDEPENDENT_AMBULATORY_CARE_PROVIDER_SITE_OTHER): Payer: Medicare Other | Admitting: Obstetrics & Gynecology

## 2016-05-21 VITALS — BP 110/60 | HR 64 | Resp 16 | Ht 59.5 in | Wt 119.0 lb

## 2016-05-21 DIAGNOSIS — Z01419 Encounter for gynecological examination (general) (routine) without abnormal findings: Secondary | ICD-10-CM | POA: Diagnosis not present

## 2016-05-21 DIAGNOSIS — Z124 Encounter for screening for malignant neoplasm of cervix: Secondary | ICD-10-CM

## 2016-05-21 NOTE — Progress Notes (Signed)
82 y.o. G2P2 WidowedCaucasianF here for annual exam.  Doing well.  No vaginal bleeding.    Patient's last menstrual period was 06/11/1976.          Sexually active: No.  The current method of family planning is post menopausal status.    Exercising: No.  The patient does not participate in regular exercise at present. Smoker:  no  Health Maintenance: Pap:  04/19/15 Neg  History of abnormal Pap:  no  MMG:  10/06/15 BIRADS1:Neg  Colonoscopy: 07/2006, 11/2012 Endoscopy Dr. Earlean Shawl BMD:   09/01/08 Osteopenia  TDaP:  05/2007  Pneumonia vaccine(s):  No Zostavax:   Done  Hep C testing: not indicated Screening Labs: PCP   reports that she has quit smoking. She has never used smokeless tobacco. She reports that she drinks about 1.8 oz of alcohol per week . She reports that she does not use drugs.  Past Medical History:  Diagnosis Date  . Broken ankle   . Diverticulitis   . Hyperlipidemia   . Hypertension   . Mastodynia   . Meniscus tear    trying cortisone injections with Dr Ronnie Derby, right leg  . Osteopenia   . Ovarian cyst    h/o  . PAC (premature atrial contraction)   . Stomach ulcer 11/10/12   dx with endoscopy, will repeat in 8/14 and will stretch esophagus    Past Surgical History:  Procedure Laterality Date  . ANKLE SURGERY  1987  . [place]  . cataracts    . MOUTH SURGERY     implants    Current Outpatient Prescriptions  Medication Sig Dispense Refill  . acetaminophen (TYLENOL) 500 MG tablet Take 1,000 mg by mouth as needed for pain.     Marland Kitchen atorvastatin (LIPITOR) 20 MG tablet Take 20 mg by mouth daily at 6 PM.    . calcium-vitamin D (OSCAL WITH D) 500-200 MG-UNIT per tablet Take 1 tablet by mouth 2 (two) times daily.    . cholecalciferol (VITAMIN D) 1000 UNITS tablet Take 1,000 Units by mouth daily at 6 PM.     . FLUZONE HIGH-DOSE 0.5 ML SUSY inject 0.5 milliliter intramuscularly  0  . LORazepam (ATIVAN) 0.5 MG tablet Take 0.5 mg by mouth as needed for anxiety.     Marland Kitchen LORazepam (ATIVAN) 2 MG tablet Take 0.5 tablets by mouth daily as needed.  0  . magnesium gluconate (MAGONATE) 500 MG tablet Take 500 mg by mouth 2 (two) times daily.    Marland Kitchen omeprazole (PRILOSEC) 40 MG capsule Take 40 mg by mouth every morning.     Vladimir Faster Glycol-Propyl Glycol (SYSTANE OP) Place 1 drop into both eyes daily as needed (for dry eyes).    . Probiotic Product (PROBIOTIC DAILY PO) Take 1 tablet by mouth every morning.    . sodium chloride (OCEAN) 0.65 % SOLN nasal spray Place 1 spray into both nostrils daily at 6 PM.    . traMADol (ULTRAM) 50 MG tablet As needed  0  . triamcinolone (NASACORT ALLERGY 24HR) 55 MCG/ACT AERO nasal inhaler Place 2 sprays into both nostrils every morning.    . triamterene-hydrochlorothiazide (MAXZIDE) 75-50 MG per tablet Take 1 tablet by mouth daily.      . verapamil (CALAN-SR) 240 MG CR tablet Take 240 mg by mouth daily.      No current facility-administered medications for this visit.     Family History  Problem Relation Age of Onset  . Coronary artery disease Father   .  Heart attack Father   . Heart failure Mother   . Colon cancer Brother     ROS:  Pertinent items are noted in HPI.  Otherwise, a comprehensive ROS was negative.  Exam:   BP 110/60 (BP Location: Right Arm, Patient Position: Sitting, Cuff Size: Normal)   Pulse 64   Resp 16   Ht 4' 11.5" (1.511 m)   Wt 119 lb (54 kg)   LMP 06/11/1976   BMI 23.63 kg/m   Weight change: -2#  Height: 4' 11.5" (151.1 cm)  Ht Readings from Last 3 Encounters:  05/21/16 4' 11.5" (1.511 m)  05/17/15 4' 11.5" (1.511 m)  04/19/15 4' 11.5" (1.511 m)    General appearance: alert, cooperative and appears stated age Head: Normocephalic, without obvious abnormality, atraumatic Neck: no adenopathy, supple, symmetrical, trachea midline and thyroid normal to inspection and palpation Lungs: clear to auscultation bilaterally Breasts: normal appearance, no masses or tenderness Heart: regular rate and  rhythm Abdomen: soft, non-tender; bowel sounds normal; no masses,  no organomegaly Extremities: extremities normal, atraumatic, no cyanosis or edema Skin: Skin color, texture, turgor normal. No rashes or lesions Lymph nodes: Cervical, supraclavicular, and axillary nodes normal. No abnormal inguinal nodes palpated Neurologic: Grossly normal   Pelvic: External genitalia:  no lesions              Urethra:  normal appearing urethra with no masses, tenderness or lesions              Bartholins and Skenes: normal                 Vagina: normal appearing vagina with normal color and discharge, no lesions              Cervix: no lesions              Pap taken: No. Bimanual Exam:  Uterus:  normal size, contour, position, consistency, mobility, non-tender              Adnexa: normal adnexa and no mass, fullness, tenderness               Rectovaginal: Confirms               Anus:  normal sphincter tone, no lesions  Chaperone was present for exam.  A:    Well Woman with normal exam PMP, no HRT H/o ovarian cysts (last u/s 1/13) Hypertension Elevated lipids  P: Mammogram screening discussed.   Pap 2016.  Pt desires to continue pap smears.  She and I have discussed guidelines.  She agrees that one is not needed this year. Pt declines having another BMD.  I continue to agree with this decision. Sees Dr. Reynaldo Minium, PCP, every 6 months. return annually or prn

## 2016-05-31 DIAGNOSIS — M1711 Unilateral primary osteoarthritis, right knee: Secondary | ICD-10-CM | POA: Diagnosis not present

## 2016-07-11 DIAGNOSIS — E784 Other hyperlipidemia: Secondary | ICD-10-CM | POA: Diagnosis not present

## 2016-07-11 DIAGNOSIS — R8290 Unspecified abnormal findings in urine: Secondary | ICD-10-CM | POA: Diagnosis not present

## 2016-07-11 DIAGNOSIS — I10 Essential (primary) hypertension: Secondary | ICD-10-CM | POA: Diagnosis not present

## 2016-07-23 DIAGNOSIS — Z1389 Encounter for screening for other disorder: Secondary | ICD-10-CM | POA: Diagnosis not present

## 2016-07-23 DIAGNOSIS — Z6822 Body mass index (BMI) 22.0-22.9, adult: Secondary | ICD-10-CM | POA: Diagnosis not present

## 2016-07-23 DIAGNOSIS — E038 Other specified hypothyroidism: Secondary | ICD-10-CM | POA: Diagnosis not present

## 2016-07-23 DIAGNOSIS — Z1212 Encounter for screening for malignant neoplasm of rectum: Secondary | ICD-10-CM | POA: Diagnosis not present

## 2016-07-23 DIAGNOSIS — Z0000 Encounter for general adult medical examination without abnormal findings: Secondary | ICD-10-CM | POA: Diagnosis not present

## 2016-07-23 DIAGNOSIS — I10 Essential (primary) hypertension: Secondary | ICD-10-CM | POA: Diagnosis not present

## 2016-07-23 DIAGNOSIS — M1990 Unspecified osteoarthritis, unspecified site: Secondary | ICD-10-CM | POA: Diagnosis not present

## 2016-07-23 DIAGNOSIS — I129 Hypertensive chronic kidney disease with stage 1 through stage 4 chronic kidney disease, or unspecified chronic kidney disease: Secondary | ICD-10-CM | POA: Diagnosis not present

## 2016-07-23 DIAGNOSIS — E784 Other hyperlipidemia: Secondary | ICD-10-CM | POA: Diagnosis not present

## 2016-07-23 DIAGNOSIS — N183 Chronic kidney disease, stage 3 (moderate): Secondary | ICD-10-CM | POA: Diagnosis not present

## 2016-07-23 DIAGNOSIS — R413 Other amnesia: Secondary | ICD-10-CM | POA: Diagnosis not present

## 2016-08-14 ENCOUNTER — Ambulatory Visit: Payer: Medicare Other | Admitting: Obstetrics & Gynecology

## 2016-09-21 DIAGNOSIS — R8299 Other abnormal findings in urine: Secondary | ICD-10-CM | POA: Diagnosis not present

## 2016-09-21 DIAGNOSIS — N390 Urinary tract infection, site not specified: Secondary | ICD-10-CM | POA: Diagnosis not present

## 2016-10-24 DIAGNOSIS — R413 Other amnesia: Secondary | ICD-10-CM | POA: Diagnosis not present

## 2016-10-24 DIAGNOSIS — E038 Other specified hypothyroidism: Secondary | ICD-10-CM | POA: Diagnosis not present

## 2016-10-24 DIAGNOSIS — Z6823 Body mass index (BMI) 23.0-23.9, adult: Secondary | ICD-10-CM | POA: Diagnosis not present

## 2016-10-30 DIAGNOSIS — H903 Sensorineural hearing loss, bilateral: Secondary | ICD-10-CM | POA: Diagnosis not present

## 2017-01-15 ENCOUNTER — Other Ambulatory Visit: Payer: Self-pay | Admitting: Obstetrics & Gynecology

## 2017-01-15 DIAGNOSIS — Z1231 Encounter for screening mammogram for malignant neoplasm of breast: Secondary | ICD-10-CM

## 2017-01-21 DIAGNOSIS — N183 Chronic kidney disease, stage 3 (moderate): Secondary | ICD-10-CM | POA: Diagnosis not present

## 2017-01-21 DIAGNOSIS — D110 Benign neoplasm of parotid gland: Secondary | ICD-10-CM | POA: Diagnosis not present

## 2017-01-21 DIAGNOSIS — Z1389 Encounter for screening for other disorder: Secondary | ICD-10-CM | POA: Diagnosis not present

## 2017-01-21 DIAGNOSIS — E784 Other hyperlipidemia: Secondary | ICD-10-CM | POA: Diagnosis not present

## 2017-01-21 DIAGNOSIS — M1990 Unspecified osteoarthritis, unspecified site: Secondary | ICD-10-CM | POA: Diagnosis not present

## 2017-01-21 DIAGNOSIS — K219 Gastro-esophageal reflux disease without esophagitis: Secondary | ICD-10-CM | POA: Diagnosis not present

## 2017-01-21 DIAGNOSIS — J309 Allergic rhinitis, unspecified: Secondary | ICD-10-CM | POA: Diagnosis not present

## 2017-01-21 DIAGNOSIS — I10 Essential (primary) hypertension: Secondary | ICD-10-CM | POA: Diagnosis not present

## 2017-01-21 DIAGNOSIS — E038 Other specified hypothyroidism: Secondary | ICD-10-CM | POA: Diagnosis not present

## 2017-01-21 DIAGNOSIS — I129 Hypertensive chronic kidney disease with stage 1 through stage 4 chronic kidney disease, or unspecified chronic kidney disease: Secondary | ICD-10-CM | POA: Diagnosis not present

## 2017-01-21 DIAGNOSIS — R413 Other amnesia: Secondary | ICD-10-CM | POA: Diagnosis not present

## 2017-01-28 DIAGNOSIS — M9903 Segmental and somatic dysfunction of lumbar region: Secondary | ICD-10-CM | POA: Diagnosis not present

## 2017-01-28 DIAGNOSIS — M9902 Segmental and somatic dysfunction of thoracic region: Secondary | ICD-10-CM | POA: Diagnosis not present

## 2017-01-28 DIAGNOSIS — S83419A Sprain of medial collateral ligament of unspecified knee, initial encounter: Secondary | ICD-10-CM | POA: Diagnosis not present

## 2017-01-28 DIAGNOSIS — M9904 Segmental and somatic dysfunction of sacral region: Secondary | ICD-10-CM | POA: Diagnosis not present

## 2017-01-28 DIAGNOSIS — S336XXA Sprain of sacroiliac joint, initial encounter: Secondary | ICD-10-CM | POA: Diagnosis not present

## 2017-01-28 DIAGNOSIS — M438X9 Other specified deforming dorsopathies, site unspecified: Secondary | ICD-10-CM | POA: Diagnosis not present

## 2017-01-28 DIAGNOSIS — M5126 Other intervertebral disc displacement, lumbar region: Secondary | ICD-10-CM | POA: Diagnosis not present

## 2017-01-28 DIAGNOSIS — M9906 Segmental and somatic dysfunction of lower extremity: Secondary | ICD-10-CM | POA: Diagnosis not present

## 2017-02-06 DIAGNOSIS — H35033 Hypertensive retinopathy, bilateral: Secondary | ICD-10-CM | POA: Diagnosis not present

## 2017-02-06 DIAGNOSIS — Z961 Presence of intraocular lens: Secondary | ICD-10-CM | POA: Diagnosis not present

## 2017-02-06 DIAGNOSIS — H35363 Drusen (degenerative) of macula, bilateral: Secondary | ICD-10-CM | POA: Diagnosis not present

## 2017-02-06 DIAGNOSIS — H43813 Vitreous degeneration, bilateral: Secondary | ICD-10-CM | POA: Diagnosis not present

## 2017-02-07 ENCOUNTER — Ambulatory Visit
Admission: RE | Admit: 2017-02-07 | Discharge: 2017-02-07 | Disposition: A | Discharge status: Home / Self Care | Payer: Medicare Other | Source: Ambulatory Visit | Attending: Obstetrics & Gynecology | Admitting: Obstetrics & Gynecology

## 2017-02-07 DIAGNOSIS — Z1231 Encounter for screening mammogram for malignant neoplasm of breast: Secondary | ICD-10-CM | POA: Diagnosis not present

## 2017-02-15 DIAGNOSIS — Z23 Encounter for immunization: Secondary | ICD-10-CM | POA: Diagnosis not present

## 2017-03-04 ENCOUNTER — Ambulatory Visit: Payer: BLUE CROSS/BLUE SHIELD

## 2017-05-06 DIAGNOSIS — M9902 Segmental and somatic dysfunction of thoracic region: Secondary | ICD-10-CM | POA: Diagnosis not present

## 2017-05-06 DIAGNOSIS — S83419A Sprain of medial collateral ligament of unspecified knee, initial encounter: Secondary | ICD-10-CM | POA: Diagnosis not present

## 2017-05-06 DIAGNOSIS — M438X9 Other specified deforming dorsopathies, site unspecified: Secondary | ICD-10-CM | POA: Diagnosis not present

## 2017-05-06 DIAGNOSIS — M9903 Segmental and somatic dysfunction of lumbar region: Secondary | ICD-10-CM | POA: Diagnosis not present

## 2017-05-06 DIAGNOSIS — M9906 Segmental and somatic dysfunction of lower extremity: Secondary | ICD-10-CM | POA: Diagnosis not present

## 2017-05-06 DIAGNOSIS — S336XXA Sprain of sacroiliac joint, initial encounter: Secondary | ICD-10-CM | POA: Diagnosis not present

## 2017-05-06 DIAGNOSIS — M5126 Other intervertebral disc displacement, lumbar region: Secondary | ICD-10-CM | POA: Diagnosis not present

## 2017-05-06 DIAGNOSIS — M9904 Segmental and somatic dysfunction of sacral region: Secondary | ICD-10-CM | POA: Diagnosis not present

## 2017-06-08 ENCOUNTER — Emergency Department (HOSPITAL_COMMUNITY): Payer: Medicare Other

## 2017-06-08 ENCOUNTER — Other Ambulatory Visit: Payer: Self-pay

## 2017-06-08 ENCOUNTER — Encounter (HOSPITAL_COMMUNITY): Payer: Self-pay | Admitting: *Deleted

## 2017-06-08 ENCOUNTER — Inpatient Hospital Stay (HOSPITAL_COMMUNITY)
Admission: EM | Admit: 2017-06-08 | Discharge: 2017-06-17 | DRG: 082 | Disposition: A | Discharge status: Skilled Nursing Facility | Payer: Medicare Other | Attending: Family Medicine | Admitting: Family Medicine

## 2017-06-08 DIAGNOSIS — Z87891 Personal history of nicotine dependence: Secondary | ICD-10-CM

## 2017-06-08 DIAGNOSIS — S0282XA Fracture of other specified skull and facial bones, left side, initial encounter for closed fracture: Secondary | ICD-10-CM | POA: Diagnosis present

## 2017-06-08 DIAGNOSIS — R4182 Altered mental status, unspecified: Secondary | ICD-10-CM | POA: Diagnosis not present

## 2017-06-08 DIAGNOSIS — S020XXA Fracture of vault of skull, initial encounter for closed fracture: Secondary | ICD-10-CM | POA: Diagnosis present

## 2017-06-08 DIAGNOSIS — R7309 Other abnormal glucose: Secondary | ICD-10-CM

## 2017-06-08 DIAGNOSIS — S199XXA Unspecified injury of neck, initial encounter: Secondary | ICD-10-CM | POA: Diagnosis not present

## 2017-06-08 DIAGNOSIS — Z8673 Personal history of transient ischemic attack (TIA), and cerebral infarction without residual deficits: Secondary | ICD-10-CM

## 2017-06-08 DIAGNOSIS — I10 Essential (primary) hypertension: Secondary | ICD-10-CM | POA: Diagnosis present

## 2017-06-08 DIAGNOSIS — S065X9A Traumatic subdural hemorrhage with loss of consciousness of unspecified duration, initial encounter: Secondary | ICD-10-CM | POA: Diagnosis present

## 2017-06-08 DIAGNOSIS — S065X0A Traumatic subdural hemorrhage without loss of consciousness, initial encounter: Secondary | ICD-10-CM | POA: Diagnosis not present

## 2017-06-08 DIAGNOSIS — Z8249 Family history of ischemic heart disease and other diseases of the circulatory system: Secondary | ICD-10-CM | POA: Diagnosis not present

## 2017-06-08 DIAGNOSIS — I629 Nontraumatic intracranial hemorrhage, unspecified: Secondary | ICD-10-CM | POA: Diagnosis not present

## 2017-06-08 DIAGNOSIS — G9341 Metabolic encephalopathy: Secondary | ICD-10-CM | POA: Diagnosis not present

## 2017-06-08 DIAGNOSIS — S0240FA Zygomatic fracture, left side, initial encounter for closed fracture: Secondary | ICD-10-CM | POA: Diagnosis present

## 2017-06-08 DIAGNOSIS — B9689 Other specified bacterial agents as the cause of diseases classified elsewhere: Secondary | ICD-10-CM | POA: Diagnosis present

## 2017-06-08 DIAGNOSIS — F0390 Unspecified dementia without behavioral disturbance: Secondary | ICD-10-CM | POA: Diagnosis present

## 2017-06-08 DIAGNOSIS — J9811 Atelectasis: Secondary | ICD-10-CM | POA: Diagnosis present

## 2017-06-08 DIAGNOSIS — S0101XA Laceration without foreign body of scalp, initial encounter: Secondary | ICD-10-CM | POA: Diagnosis present

## 2017-06-08 DIAGNOSIS — I361 Nonrheumatic tricuspid (valve) insufficiency: Secondary | ICD-10-CM | POA: Diagnosis not present

## 2017-06-08 DIAGNOSIS — F05 Delirium due to known physiological condition: Secondary | ICD-10-CM | POA: Diagnosis not present

## 2017-06-08 DIAGNOSIS — R262 Difficulty in walking, not elsewhere classified: Secondary | ICD-10-CM | POA: Diagnosis not present

## 2017-06-08 DIAGNOSIS — S0291XB Unspecified fracture of skull, initial encounter for open fracture: Secondary | ICD-10-CM | POA: Diagnosis not present

## 2017-06-08 DIAGNOSIS — S0292XA Unspecified fracture of facial bones, initial encounter for closed fracture: Secondary | ICD-10-CM

## 2017-06-08 DIAGNOSIS — I6200 Nontraumatic subdural hemorrhage, unspecified: Secondary | ICD-10-CM | POA: Diagnosis not present

## 2017-06-08 DIAGNOSIS — R739 Hyperglycemia, unspecified: Secondary | ICD-10-CM | POA: Diagnosis present

## 2017-06-08 DIAGNOSIS — R509 Fever, unspecified: Secondary | ICD-10-CM | POA: Diagnosis not present

## 2017-06-08 DIAGNOSIS — S02402A Zygomatic fracture, unspecified, initial encounter for closed fracture: Secondary | ICD-10-CM | POA: Diagnosis not present

## 2017-06-08 DIAGNOSIS — Y92009 Unspecified place in unspecified non-institutional (private) residence as the place of occurrence of the external cause: Secondary | ICD-10-CM | POA: Diagnosis not present

## 2017-06-08 DIAGNOSIS — R7881 Bacteremia: Secondary | ICD-10-CM | POA: Diagnosis present

## 2017-06-08 DIAGNOSIS — R0902 Hypoxemia: Secondary | ICD-10-CM | POA: Diagnosis present

## 2017-06-08 DIAGNOSIS — M8580 Other specified disorders of bone density and structure, unspecified site: Secondary | ICD-10-CM | POA: Diagnosis present

## 2017-06-08 DIAGNOSIS — Z66 Do not resuscitate: Secondary | ICD-10-CM | POA: Diagnosis present

## 2017-06-08 DIAGNOSIS — S0190XA Unspecified open wound of unspecified part of head, initial encounter: Secondary | ICD-10-CM | POA: Diagnosis not present

## 2017-06-08 DIAGNOSIS — S098XXA Other specified injuries of head, initial encounter: Secondary | ICD-10-CM | POA: Diagnosis not present

## 2017-06-08 DIAGNOSIS — R278 Other lack of coordination: Secondary | ICD-10-CM | POA: Diagnosis not present

## 2017-06-08 DIAGNOSIS — J90 Pleural effusion, not elsewhere classified: Secondary | ICD-10-CM | POA: Diagnosis not present

## 2017-06-08 DIAGNOSIS — R1311 Dysphagia, oral phase: Secondary | ICD-10-CM | POA: Diagnosis not present

## 2017-06-08 DIAGNOSIS — I609 Nontraumatic subarachnoid hemorrhage, unspecified: Secondary | ICD-10-CM | POA: Diagnosis not present

## 2017-06-08 DIAGNOSIS — W19XXXA Unspecified fall, initial encounter: Secondary | ICD-10-CM | POA: Diagnosis present

## 2017-06-08 DIAGNOSIS — S066X9A Traumatic subarachnoid hemorrhage with loss of consciousness of unspecified duration, initial encounter: Secondary | ICD-10-CM | POA: Diagnosis present

## 2017-06-08 DIAGNOSIS — K219 Gastro-esophageal reflux disease without esophagitis: Secondary | ICD-10-CM | POA: Diagnosis present

## 2017-06-08 DIAGNOSIS — Z79891 Long term (current) use of opiate analgesic: Secondary | ICD-10-CM

## 2017-06-08 DIAGNOSIS — Z79899 Other long term (current) drug therapy: Secondary | ICD-10-CM

## 2017-06-08 DIAGNOSIS — Z8711 Personal history of peptic ulcer disease: Secondary | ICD-10-CM

## 2017-06-08 DIAGNOSIS — M6281 Muscle weakness (generalized): Secondary | ICD-10-CM | POA: Diagnosis not present

## 2017-06-08 DIAGNOSIS — E785 Hyperlipidemia, unspecified: Secondary | ICD-10-CM | POA: Diagnosis present

## 2017-06-08 DIAGNOSIS — Z800 Family history of malignant neoplasm of digestive organs: Secondary | ICD-10-CM | POA: Diagnosis not present

## 2017-06-08 DIAGNOSIS — M810 Age-related osteoporosis without current pathological fracture: Secondary | ICD-10-CM | POA: Diagnosis present

## 2017-06-08 DIAGNOSIS — S065XAA Traumatic subdural hemorrhage with loss of consciousness status unknown, initial encounter: Secondary | ICD-10-CM | POA: Diagnosis present

## 2017-06-08 DIAGNOSIS — S0181XD Laceration without foreign body of other part of head, subsequent encounter: Secondary | ICD-10-CM | POA: Diagnosis not present

## 2017-06-08 DIAGNOSIS — S0292XD Unspecified fracture of facial bones, subsequent encounter for fracture with routine healing: Secondary | ICD-10-CM | POA: Diagnosis not present

## 2017-06-08 DIAGNOSIS — J9601 Acute respiratory failure with hypoxia: Secondary | ICD-10-CM | POA: Diagnosis not present

## 2017-06-08 DIAGNOSIS — R488 Other symbolic dysfunctions: Secondary | ICD-10-CM | POA: Diagnosis not present

## 2017-06-08 DIAGNOSIS — D72829 Elevated white blood cell count, unspecified: Secondary | ICD-10-CM | POA: Diagnosis present

## 2017-06-08 DIAGNOSIS — S066X0A Traumatic subarachnoid hemorrhage without loss of consciousness, initial encounter: Secondary | ICD-10-CM | POA: Diagnosis not present

## 2017-06-08 DIAGNOSIS — S0291XA Unspecified fracture of skull, initial encounter for closed fracture: Secondary | ICD-10-CM | POA: Diagnosis not present

## 2017-06-08 DIAGNOSIS — S065X0D Traumatic subdural hemorrhage without loss of consciousness, subsequent encounter: Secondary | ICD-10-CM | POA: Diagnosis not present

## 2017-06-08 DIAGNOSIS — R41841 Cognitive communication deficit: Secondary | ICD-10-CM | POA: Diagnosis not present

## 2017-06-08 DIAGNOSIS — N83299 Other ovarian cyst, unspecified side: Secondary | ICD-10-CM | POA: Diagnosis present

## 2017-06-08 DIAGNOSIS — S0990XA Unspecified injury of head, initial encounter: Secondary | ICD-10-CM | POA: Diagnosis not present

## 2017-06-08 LAB — CBC WITH DIFFERENTIAL/PLATELET
BASOS PCT: 0 %
Basophils Absolute: 0 10*3/uL (ref 0.0–0.1)
EOS ABS: 0 10*3/uL (ref 0.0–0.7)
EOS PCT: 0 %
HCT: 38.2 % (ref 36.0–46.0)
Hemoglobin: 13.1 g/dL (ref 12.0–15.0)
LYMPHS ABS: 0.8 10*3/uL (ref 0.7–4.0)
Lymphocytes Relative: 5 %
MCH: 30.9 pg (ref 26.0–34.0)
MCHC: 34.3 g/dL (ref 30.0–36.0)
MCV: 90.1 fL (ref 78.0–100.0)
MONO ABS: 0.6 10*3/uL (ref 0.1–1.0)
MONOS PCT: 4 %
Neutro Abs: 13.8 10*3/uL — ABNORMAL HIGH (ref 1.7–7.7)
Neutrophils Relative %: 91 %
Platelets: 267 10*3/uL (ref 150–400)
RBC: 4.24 MIL/uL (ref 3.87–5.11)
RDW: 13.1 % (ref 11.5–15.5)
WBC: 15.2 10*3/uL — ABNORMAL HIGH (ref 4.0–10.5)

## 2017-06-08 LAB — COMPREHENSIVE METABOLIC PANEL
ALBUMIN: 3.4 g/dL — AB (ref 3.5–5.0)
ALK PHOS: 108 U/L (ref 38–126)
ALT: 22 U/L (ref 14–54)
ANION GAP: 10 (ref 5–15)
AST: 35 U/L (ref 15–41)
BUN: 24 mg/dL — ABNORMAL HIGH (ref 6–20)
CALCIUM: 9.1 mg/dL (ref 8.9–10.3)
CO2: 24 mmol/L (ref 22–32)
Chloride: 102 mmol/L (ref 101–111)
Creatinine, Ser: 0.81 mg/dL (ref 0.44–1.00)
GFR calc non Af Amer: >60 mL/min (ref >60)
GLUCOSE: 185 mg/dL — AB (ref 65–99)
POTASSIUM: 3.1 mmol/L — AB (ref 3.5–5.1)
SODIUM: 136 mmol/L (ref 135–145)
TOTAL PROTEIN: 6 g/dL — AB (ref 6.5–8.1)
Total Bilirubin: 0.9 mg/dL (ref 0.3–1.2)

## 2017-06-08 LAB — URINALYSIS, ROUTINE W REFLEX MICROSCOPIC
Bilirubin Urine: NEGATIVE
GLUCOSE, UA: NEGATIVE mg/dL
Hgb urine dipstick: NEGATIVE
KETONES UR: 5 mg/dL — AB
LEUKOCYTES UA: NEGATIVE
NITRITE: NEGATIVE
PH: 6 (ref 5.0–8.0)
PROTEIN: NEGATIVE mg/dL
Specific Gravity, Urine: 1.018 (ref 1.005–1.030)

## 2017-06-08 LAB — CK: Total CK: 95 U/L (ref 38–234)

## 2017-06-08 LAB — MRSA PCR SCREENING: MRSA by PCR: NEGATIVE

## 2017-06-08 LAB — HEMOGLOBIN A1C
Hgb A1c MFr Bld: 5.7 % — ABNORMAL HIGH (ref 4.8–5.6)
MEAN PLASMA GLUCOSE: 116.89 mg/dL

## 2017-06-08 LAB — I-STAT CG4 LACTIC ACID, ED: LACTIC ACID, VENOUS: 1.56 mmol/L (ref 0.5–1.9)

## 2017-06-08 MED ORDER — ATORVASTATIN CALCIUM 10 MG PO TABS
20.0000 mg | ORAL_TABLET | Freq: Every day | ORAL | Status: DC
  Administered 2017-06-08 – 2017-06-16 (×8): 20 mg via ORAL
  Filled 2017-06-08 (×8): qty 1
  Filled 2017-06-08 (×2): qty 2

## 2017-06-08 MED ORDER — BISACODYL 10 MG RE SUPP: 10.0000 mg | Freq: Every day | RECTAL | Status: DC | PRN

## 2017-06-08 MED ORDER — ONDANSETRON HCL 4 MG/2ML IJ SOLN
4.0000 mg | Freq: Four times a day (QID) | INTRAMUSCULAR | Status: DC | PRN
  Filled 2017-06-08: qty 2

## 2017-06-08 MED ORDER — PANTOPRAZOLE SODIUM 40 MG PO TBEC
40.0000 mg | DELAYED_RELEASE_TABLET | Freq: Every day | ORAL | Status: DC
  Administered 2017-06-09 – 2017-06-17 (×9): 40 mg via ORAL
  Filled 2017-06-08 (×9): qty 1

## 2017-06-08 MED ORDER — KETOROLAC TROMETHAMINE 15 MG/ML IJ SOLN
15.0000 mg | Freq: Four times a day (QID) | INTRAMUSCULAR | Status: AC | PRN
  Administered 2017-06-08: 15 mg via INTRAVENOUS
  Filled 2017-06-08: qty 1

## 2017-06-08 MED ORDER — SODIUM CHLORIDE 0.9 % IV BOLUS (SEPSIS)
1000.0000 mL | Freq: Once | INTRAVENOUS | Status: AC
  Administered 2017-06-08: 1000 mL via INTRAVENOUS

## 2017-06-08 MED ORDER — POTASSIUM CHLORIDE 10 MEQ/100ML IV SOLN
10.0000 meq | Freq: Once | INTRAVENOUS | Status: AC
  Administered 2017-06-08: 10 meq via INTRAVENOUS
  Filled 2017-06-08: qty 100

## 2017-06-08 MED ORDER — LORAZEPAM 0.5 MG PO TABS: 0.5000 mg | ORAL_TABLET | Freq: Four times a day (QID) | ORAL | Status: DC | PRN

## 2017-06-08 MED ORDER — SENNOSIDES-DOCUSATE SODIUM 8.6-50 MG PO TABS: 1.0000 | ORAL_TABLET | Freq: Every evening | ORAL | Status: DC | PRN

## 2017-06-08 MED ORDER — SODIUM CHLORIDE 0.9 % IV SOLN
500.0000 mg | Freq: Two times a day (BID) | INTRAVENOUS | Status: DC
  Administered 2017-06-08 – 2017-06-09 (×4): 500 mg via INTRAVENOUS
  Filled 2017-06-08 (×4): qty 5

## 2017-06-08 MED ORDER — HYDROCODONE-ACETAMINOPHEN 5-325 MG PO TABS
1.0000 | ORAL_TABLET | ORAL | Status: DC | PRN
  Administered 2017-06-08 – 2017-06-09 (×3): 1 via ORAL
  Filled 2017-06-08 (×3): qty 1

## 2017-06-08 MED ORDER — SODIUM CHLORIDE 0.9 % IV SOLN
INTRAVENOUS | Status: DC
  Administered 2017-06-08: 1000 mL via INTRAVENOUS
  Administered 2017-06-08 – 2017-06-09 (×2): via INTRAVENOUS

## 2017-06-08 MED ORDER — ACETAMINOPHEN 650 MG RE SUPP
650.0000 mg | Freq: Four times a day (QID) | RECTAL | Status: DC | PRN
  Administered 2017-06-12: 650 mg via RECTAL
  Filled 2017-06-08: qty 1

## 2017-06-08 MED ORDER — ACETAMINOPHEN 325 MG PO TABS
650.0000 mg | ORAL_TABLET | Freq: Four times a day (QID) | ORAL | Status: DC | PRN
  Administered 2017-06-09 – 2017-06-16 (×16): 650 mg via ORAL
  Filled 2017-06-08 (×17): qty 2

## 2017-06-08 MED ORDER — ONDANSETRON HCL 4 MG/2ML IJ SOLN
4.0000 mg | Freq: Once | INTRAMUSCULAR | Status: AC
  Administered 2017-06-08: 4 mg via INTRAVENOUS
  Filled 2017-06-08: qty 2

## 2017-06-08 MED ORDER — VITAMIN D 1000 UNITS PO TABS
1000.0000 [IU] | ORAL_TABLET | Freq: Every day | ORAL | Status: DC
  Administered 2017-06-08 – 2017-06-16 (×8): 1000 [IU] via ORAL
  Filled 2017-06-08 (×8): qty 1

## 2017-06-08 MED ORDER — VERAPAMIL HCL ER 240 MG PO TBCR
240.0000 mg | EXTENDED_RELEASE_TABLET | Freq: Every day | ORAL | Status: DC
  Administered 2017-06-09 – 2017-06-17 (×9): 240 mg via ORAL
  Filled 2017-06-08 (×10): qty 1

## 2017-06-08 MED ORDER — CALCIUM CARBONATE-VITAMIN D 500-200 MG-UNIT PO TABS
1.0000 | ORAL_TABLET | Freq: Two times a day (BID) | ORAL | Status: DC
  Administered 2017-06-09 – 2017-06-17 (×15): 1 via ORAL
  Filled 2017-06-08 (×16): qty 1

## 2017-06-08 MED ORDER — TRIAMTERENE-HCTZ 75-50 MG PO TABS
1.0000 | ORAL_TABLET | Freq: Every day | ORAL | Status: DC
  Administered 2017-06-09 – 2017-06-17 (×9): 1 via ORAL
  Filled 2017-06-08 (×10): qty 1

## 2017-06-08 MED ORDER — ONDANSETRON HCL 4 MG PO TABS: 4.0000 mg | ORAL_TABLET | Freq: Four times a day (QID) | ORAL | Status: DC | PRN

## 2017-06-08 NOTE — ED Triage Notes (Signed)
Pt  On floor in her home by [employer] when answering an alarm at home. Pt was found with blood on floor. Pt has lac to back of head. Pt is alert to name and year. Pt found with low SATs on room air . Pt transported on 3 liters of with O2 sats 99. Pt on room air with 94% on room air. CBG 220, Mild dementia.

## 2017-06-08 NOTE — ED Notes (Addendum)
Attempted to call report. Stated room is not clean yet

## 2017-06-08 NOTE — ED Notes (Signed)
Admit Doctor at bedside.  

## 2017-06-08 NOTE — ED Notes (Signed)
Spoke with pharmacy regarding [place].  Stated will be sending medication via the tube station.

## 2017-06-08 NOTE — Consult Note (Signed)
Chief Complaint   Chief Complaint  Patient presents with  . Fall  . Head Laceration    HPI   HPI: Lori Boone is a 83 y.o. female who was brought to ER after being found down at home. History given by family at bedside. By report, house alarm was going off so alarm company notified [employer]. When [employer] arrived, patient was on floor with dried blood next to her. Dinner was still on table. Patient typically eats at 14. Family assumes she fell prior to that since her dinner was still there. Has mild dementia but lives at home alone. Able to perform all ADLs solo.  LNK 1800 when daughter spoke to pt. Currently complains of headache. Not on anti-coag.  Patient Active Problem List   Diagnosis Date Noted  . Hypertension 12/06/2015  . Arthritis of knee, degenerative 05/04/2015  . Arthritis of ankle, degenerative 05/04/2015  . Hearing aid worn 04/19/2015  . Near syncope 09/03/2013  . Palpitations 07/31/2012  . Dyspnea 04/30/2011  . Chest pain 04/18/2011    PMH: Past Medical History:  Diagnosis Date  . Broken ankle   . Diverticulitis   . Hyperlipidemia   . Hypertension   . Mastodynia   . Meniscus tear    trying cortisone injections with Dr Ronnie Derby, right leg  . Osteopenia   . Ovarian cyst    h/o  . PAC (premature atrial contraction)   . Stomach ulcer 11/10/12   dx with endoscopy, will repeat in 8/14 and will stretch esophagus    PSH: Past Surgical History:  Procedure Laterality Date  . ANKLE SURGERY  1987  . [place]  . cataracts    . MOUTH SURGERY     implants     (Not in a hospital admission)  SH: Social History   Tobacco Use  . Smoking status: Former Research scientist (life sciences)  . Smokeless tobacco: Never Used  . Tobacco comment: 20 yrs ago.  Substance Use Topics  . Alcohol use: Yes    Alcohol/week: 1.8 oz    Types: 3 Glasses of wine per week  . Drug use: No    MEDS: Prior to Admission medications   Medication Sig Start Date End Date Taking? Authorizing Provider   acetaminophen (TYLENOL) 500 MG tablet Take 1,000 mg by mouth as needed for pain.     [provider]  atorvastatin (LIPITOR) 20 MG tablet Take 20 mg by mouth daily at 6 PM. 11/19/13   [provider]  calcium-vitamin D (OSCAL WITH D) 500-200 MG-UNIT per tablet Take 1 tablet by mouth 2 (two) times daily.    [provider]  cholecalciferol (VITAMIN D) 1000 UNITS tablet Take 1,000 Units by mouth daily at 6 PM.     [provider]  FLUZONE HIGH-DOSE 0.5 ML SUSY inject 0.5 milliliter intramuscularly 03/22/15   [provider]  LORazepam (ATIVAN) 0.5 MG tablet Take 0.5 mg by mouth as needed for anxiety.    [provider]  LORazepam (ATIVAN) 2 MG tablet Take 0.5 tablets by mouth daily as needed. 04/18/16   [provider]  magnesium gluconate (MAGONATE) 500 MG tablet Take 500 mg by mouth 2 (two) times daily.    [provider]  omeprazole (PRILOSEC) 40 MG capsule Take 40 mg by mouth every morning.  12/08/12   [provider]  Polyethyl Glycol-Propyl Glycol (SYSTANE OP) Place 1 drop into both eyes daily as needed (for dry eyes).    [provider]  Probiotic  Product (PROBIOTIC DAILY PO) Take 1 tablet by mouth every morning.    [provider]  sodium chloride (OCEAN) 0.65 % SOLN nasal spray Place 1 spray into both nostrils daily at 6 PM.    [provider]  traMADol (ULTRAM) 50 MG tablet As needed 03/25/15   [provider]  triamcinolone (NASACORT ALLERGY 24HR) 55 MCG/ACT AERO nasal inhaler Place 2 sprays into both nostrils every morning.    [provider]  triamterene-hydrochlorothiazide (MAXZIDE) 75-50 MG per tablet Take 1 tablet by mouth daily.      [provider]  verapamil (CALAN-SR) 240 MG CR tablet Take 240 mg by mouth daily.     [provider]    ALLERGY: Allergies  Allergen Reactions  . Codeine Nausea Only  . Penicillins     Makes patient not  feel well all over    Social History   Tobacco Use  . Smoking status: Former Research scientist (life sciences)  . Smokeless tobacco: Never Used  . Tobacco comment: 20 yrs ago.  Substance Use Topics  . Alcohol use: Yes    Alcohol/week: 1.8 oz    Types: 3 Glasses of wine per week     Family History  Problem Relation Age of Onset  . Coronary artery disease Father   . Heart attack Father   . Heart failure Mother   . Colon cancer Brother      ROS   Review of Systems  Constitutional: Negative.   HENT: Negative.   Eyes: Negative.   Respiratory: Negative.   Cardiovascular: Negative.   Gastrointestinal: Negative.   Genitourinary: Negative.   Musculoskeletal: Negative.   Skin: Negative.   Neurological: Positive for headaches. Negative for dizziness, tingling, tremors, sensory change, speech change, focal weakness, seizures and loss of consciousness.     Exam   Vitals:   06/08/17 0900 06/08/17 0905  BP: (!) 149/48   Pulse: 79   Resp: (!) 26   Temp:  98.3 F (36.8 C)  SpO2: 97%    General appearance: Elderly female, resting comfortably, NAD Eyes: PERRL, Fundoscopic: normal Cardiovascular: Regular rate and rhythm without murmurs, rubs, gallops. No edema or variciosities. Distal pulses normal. Pulmonary: Clear to auscultation Musculoskeletal:     Muscle tone upper extremities: Normal    Muscle tone lower extremities: Normal    Motor exam: Upper Extremities Deltoid Bicep Tricep Grip  Right 5/5 5/5 5/5 5/5  Left 5/5 5/5 5/5 5/5   Lower Extremity IP Quad PF DF EHL  Right 5/5 5/5 5/5 5/5 5/5  Left 5/5 5/5 5/5 5/5 5/5   Neurological Drowsy Oriented to self and DOB, not location or year Speech appropriate CNII: Visual fields normal CNIII/IV/VI: EOMI CNV: Facial sensation normal CNVII: Symmetric, normal strength CNVIII: Grossly normal CNIX: Normal palate movement CNXI: Trap and SCM strength normal CN XII: Tongue protrusion normal Sensation grossly intact to LT  Results - Imaging/Labs    Results for orders placed or performed during the hospital encounter of 06/08/17 (from the past 48 hour(s))  Urinalysis, Routine w reflex microscopic     Status: Abnormal   Collection Time: 06/08/17  7:32 AM  Result Value Ref Range   Color, Urine YELLOW YELLOW   APPearance CLEAR CLEAR   Specific Gravity, Urine 1.018 1.005 - 1.030   pH 6.0 5.0 - 8.0   Glucose, UA NEGATIVE NEGATIVE mg/dL   Hgb urine dipstick NEGATIVE NEGATIVE   Bilirubin Urine NEGATIVE NEGATIVE   Ketones, ur 5 (A) NEGATIVE mg/dL  Protein, ur NEGATIVE NEGATIVE mg/dL   Nitrite NEGATIVE NEGATIVE   Leukocytes, UA NEGATIVE NEGATIVE  CBC with Differential/Platelet     Status: Abnormal   Collection Time: 06/08/17  7:41 AM  Result Value Ref Range   WBC 15.2 (H) 4.0 - 10.5 K/uL   RBC 4.24 3.87 - 5.11 MIL/uL   Hemoglobin 13.1 12.0 - 15.0 g/dL   HCT 38.2 36.0 - 46.0 %   MCV 90.1 78.0 - 100.0 fL   MCH 30.9 26.0 - 34.0 pg   MCHC 34.3 30.0 - 36.0 g/dL   RDW 13.1 11.5 - 15.5 %   Platelets 267 150 - 400 K/uL   Neutrophils Relative % 91 %   Neutro Abs 13.8 (H) 1.7 - 7.7 K/uL   Lymphocytes Relative 5 %   Lymphs Abs 0.8 0.7 - 4.0 K/uL   Monocytes Relative 4 %   Monocytes Absolute 0.6 0.1 - 1.0 K/uL   Eosinophils Relative 0 %   Eosinophils Absolute 0.0 0.0 - 0.7 K/uL   Basophils Relative 0 %   Basophils Absolute 0.0 0.0 - 0.1 K/uL  Comprehensive metabolic panel     Status: Abnormal   Collection Time: 06/08/17  7:41 AM  Result Value Ref Range   Sodium 136 135 - 145 mmol/L   Potassium 3.1 (L) 3.5 - 5.1 mmol/L   Chloride 102 101 - 111 mmol/L   CO2 24 22 - 32 mmol/L   Glucose, Bld 185 (H) 65 - 99 mg/dL   BUN 24 (H) 6 - 20 mg/dL   Creatinine, Ser 0.81 0.44 - 1.00 mg/dL   Calcium 9.1 8.9 - 10.3 mg/dL   Total Protein 6.0 (L) 6.5 - 8.1 g/dL   Albumin 3.4 (L) 3.5 - 5.0 g/dL   AST 35 15 - 41 U/L   ALT 22 14 - 54 U/L   Alkaline Phosphatase 108 38 - 126 U/L   Total Bilirubin 0.9 0.3 - 1.2 mg/dL   GFR calc non Af Amer >60 >60  mL/min   GFR calc Af Amer >60 >60 mL/min    Comment: (NOTE) The eGFR has been calculated using the CKD EPI equation. This calculation has not been validated in all clinical situations. eGFR's persistently <60 mL/min signify possible Chronic Kidney Disease.    Anion gap 10 5 - 15  CK     Status: None   Collection Time: 06/08/17  7:41 AM  Result Value Ref Range   Total CK 95 38 - 234 U/L  I-Stat CG4 Lactic Acid, ED     Status: None   Collection Time: 06/08/17  8:01 AM  Result Value Ref Range   Lactic Acid, Venous 1.56 0.5 - 1.9 mmol/L    Ct Head Wo Contrast  Result Date: 06/08/2017 CLINICAL DATA:  Found down. Altered mental status. Large laceration on the back of the head and hematoma over the left temple. EXAM: CT HEAD WITHOUT CONTRAST CT CERVICAL SPINE WITHOUT CONTRAST TECHNIQUE: Multidetector CT imaging of the head and cervical spine was performed following the standard protocol without intravenous contrast. Multiplanar CT image reconstructions of the cervical spine were also generated. COMPARISON:  MRI and CT head dated September 03, 2013. FINDINGS: CT HEAD FINDINGS Brain: There is an acute left cerebral convexity subdural hematoma, measuring up to a maximal thickness of 15 mm along the left frontal lobe. There is mass effect on the adjacent left frontal gyri. There is no midline shift or herniation. There is a small amount of subarachnoid blood in the  left frontal sulci. Stable cerebral atrophy and chronic microvascular ischemic white matter disease. Vascular: Atherosclerotic vascular calcification of the carotid siphons. No hyperdense vessel. Skull: There is an acute, nondisplaced fracture of the left parietal bone, extending into the greater wing of the sphenoid. There is a nondisplaced fracture of the right side of the occipital bone. Sinuses/Orbits: Layering blood within the bilateral sphenoid sinuses and left ethmoid air cells. The mastoid air cells are clear. Small foci of air within the  right infratemporal fossa. Other: Small scalp hematomas over the right occipital bone and left frontal/parietal bones. CT CERVICAL SPINE FINDINGS Alignment: Focal reversal of the normal cervical lordosis at C4. Trace anterolisthesis at C3-C4 and C7-T1. Trace stepwise retrolisthesis at C4-C5 and C5-C6. No traumatic malalignment. Skull base and vertebrae: No acute fracture. No primary bone lesion or focal pathologic process. Soft tissues and spinal canal: No prevertebral fluid or swelling. No visible canal hematoma. Disc levels: Multilevel degenerative changes of the cervical spine, moderate at C4-C5 and C5-C6. At least moderate central spinal canal stenosis at C4-C5 due to posterior disc osteophyte complex. Scattered moderate facet arthropathy, greater on the left. Moderate to severe uncovertebral hypertrophy at C4-C5 and C5-C6 resulting in mild neuroforaminal stenosis on the left at C4-C5 and moderate neuroforaminal stenosis on the right at C5-C6. Upper chest: Negative. Other: There is a 1.1 cm low-density nodule in the left thyroid lobe. IMPRESSION: 1. Acute left cerebral convexity subdural hematoma, measuring up to 15 mm along the left frontal lobe. Adjacent mass effect on the left frontal gyri, without midline shift or herniation. 2. Small amount of subarachnoid hemorrhage in the left frontal sulci. 3. Acute, nondisplaced fracture of the left parietal bone, extending into the greater wing of the sphenoid. 4. Nondisplaced fracture through the right aspect of the occipital bone. 5. Layering blood within the bilateral sphenoid sinuses and left ethmoid air cells, with small amount of air in the right infratemporal fossa. Findings are concerning for additional facial fractures, and maxillofacial CT is recommended for further evaluation. 6. No acute cervical spine fracture. Degenerative changes, moderate at C4-C5 and C5-C6. Critical Value/emergent results were called by telephone at the time of interpretation on  06/08/2017 at 8:59 am to Dr. Pattricia Boss , who verbally acknowledged these results. Electronically Signed   By: Titus Dubin M.D.   On: 06/08/2017 08:59   Ct Cervical Spine Wo Contrast  Result Date: 06/08/2017 CLINICAL DATA:  Found down. Altered mental status. Large laceration on the back of the head and hematoma over the left temple. EXAM: CT HEAD WITHOUT CONTRAST CT CERVICAL SPINE WITHOUT CONTRAST TECHNIQUE: Multidetector CT imaging of the head and cervical spine was performed following the standard protocol without intravenous contrast. Multiplanar CT image reconstructions of the cervical spine were also generated. COMPARISON:  MRI and CT head dated September 03, 2013. FINDINGS: CT HEAD FINDINGS Brain: There is an acute left cerebral convexity subdural hematoma, measuring up to a maximal thickness of 15 mm along the left frontal lobe. There is mass effect on the adjacent left frontal gyri. There is no midline shift or herniation. There is a small amount of subarachnoid blood in the left frontal sulci. Stable cerebral atrophy and chronic microvascular ischemic white matter disease. Vascular: Atherosclerotic vascular calcification of the carotid siphons. No hyperdense vessel. Skull: There is an acute, nondisplaced fracture of the left parietal bone, extending into the greater wing of the sphenoid. There is a nondisplaced fracture of the right side of the occipital bone. Sinuses/Orbits: Layering blood  within the bilateral sphenoid sinuses and left ethmoid air cells. The mastoid air cells are clear. Small foci of air within the right infratemporal fossa. Other: Small scalp hematomas over the right occipital bone and left frontal/parietal bones. CT CERVICAL SPINE FINDINGS Alignment: Focal reversal of the normal cervical lordosis at C4. Trace anterolisthesis at C3-C4 and C7-T1. Trace stepwise retrolisthesis at C4-C5 and C5-C6. No traumatic malalignment. Skull base and vertebrae: No acute fracture. No primary bone  lesion or focal pathologic process. Soft tissues and spinal canal: No prevertebral fluid or swelling. No visible canal hematoma. Disc levels: Multilevel degenerative changes of the cervical spine, moderate at C4-C5 and C5-C6. At least moderate central spinal canal stenosis at C4-C5 due to posterior disc osteophyte complex. Scattered moderate facet arthropathy, greater on the left. Moderate to severe uncovertebral hypertrophy at C4-C5 and C5-C6 resulting in mild neuroforaminal stenosis on the left at C4-C5 and moderate neuroforaminal stenosis on the right at C5-C6. Upper chest: Negative. Other: There is a 1.1 cm low-density nodule in the left thyroid lobe. IMPRESSION: 1. Acute left cerebral convexity subdural hematoma, measuring up to 15 mm along the left frontal lobe. Adjacent mass effect on the left frontal gyri, without midline shift or herniation. 2. Small amount of subarachnoid hemorrhage in the left frontal sulci. 3. Acute, nondisplaced fracture of the left parietal bone, extending into the greater wing of the sphenoid. 4. Nondisplaced fracture through the right aspect of the occipital bone. 5. Layering blood within the bilateral sphenoid sinuses and left ethmoid air cells, with small amount of air in the right infratemporal fossa. Findings are concerning for additional facial fractures, and maxillofacial CT is recommended for further evaluation. 6. No acute cervical spine fracture. Degenerative changes, moderate at C4-C5 and C5-C6. Critical Value/emergent results were called by telephone at the time of interpretation on 06/08/2017 at 8:59 am to Dr. Pattricia Boss , who verbally acknowledged these results. Electronically Signed   By: Titus Dubin M.D.   On: 06/08/2017 08:59   Dg Chest Port 1 View  Result Date: 06/08/2017 CLINICAL DATA:  Altered mental status.  Hypoxia. EXAM: PORTABLE CHEST 1 VIEW COMPARISON:  11/23/2013 FINDINGS: Numerous leads and wires project over the chest. Midline trachea. Normal  heart size. Atherosclerosis in the transverse aorta. Tortuous thoracic aorta. No pleural effusion or pneumothorax. Clear lungs. IMPRESSION: No acute cardiopulmonary disease. Aortic Atherosclerosis (ICD10-I70.0). Electronically Signed   By: Abigail Miyamoto M.D.   On: 06/08/2017 08:29    Impression/Plan   83 y.o. female with small SDH, SAH and multiple skull fractures after presumed unwitnessed fall at home. She is neuro intact with the exception of orientation as noted above. Does have mild dementia by report. Per daughter, she is DNI/extreme measures  SDH/SAH - Non operative. Should hopefully resolve with time - Admit for monitoring under TH - Neuro exam q 1-2 hours, report changes - Repeat head CT tomorrow am, sooner as indicated by exam - Keppra 529m BID x7days for seizure prophylaxis - Per daughter, should surgical intervention be required due to worsening, family wishes for palliative care only  Skull fractures/Occipital condyle fracture - Non operative - Pain control - C Collar at all times  Can follow up in 4 weeks for outpt follow up. Please call for any concerns.  [place]

## 2017-06-08 NOTE — Plan of Care (Signed)
Pt confused, discussed with pt's daughter plan of care.  Will continue to monitor pt

## 2017-06-08 NOTE — ED Notes (Signed)
Patient transported to CT 

## 2017-06-08 NOTE — ED Provider Notes (Addendum)
[facility] Provider Note   CSN: [id number] Arrival date & time: 06/08/17  0715     History   Chief Complaint No chief complaint on file.   HPI TONIMARIE GRITZ is a 83 y.o. female.  HPI  Level 5 caveat 83 yo female from home via ems for that she was found on the ground with altered mental status.  They report that they were alerted to this due to an alarm that went off in the home.  Family reports that they enter the home after EMS had retrieved her and her dinner had not been eaten, but not slept in, and there was blood on the floor in the kitchen.  Reports that she has a history of mild dementia but lives alone independently and is not confused at baseline that she is now.  Her niece Baker Janus is present and giving history.  Daughter is reported to be in route from out of town.  They report that she was last known normal at 6 PM last night which would have been the time her daughter spoke to her.  Past Medical History:  Diagnosis Date  . Broken ankle   . Diverticulitis   . Hyperlipidemia   . Hypertension   . Mastodynia   . Meniscus tear    trying cortisone injections with Dr Ronnie Derby, right leg  . Osteopenia   . Ovarian cyst    h/o  . PAC (premature atrial contraction)   . Stomach ulcer 11/10/12   dx with endoscopy, will repeat in 8/14 and will stretch esophagus    Patient Active Problem List   Diagnosis Date Noted  . Arthritis of knee, degenerative 05/04/2015  . Arthritis of ankle, degenerative 05/04/2015  . Hearing aid worn 04/19/2015  . Near syncope 09/03/2013  . Palpitations 07/31/2012  . Dyspnea 04/30/2011  . Chest pain 04/18/2011    Past Surgical History:  Procedure Laterality Date  . ANKLE SURGERY  1987  . [place]  . cataracts    . MOUTH SURGERY     implants    OB History    Gravida Para Term Preterm AB Living   2 2       2    SAB TAB Ectopic Multiple Live Births           1      Obstetric Comments   One  child is deceased 2005-09-08       Home Medications    Prior to Admission medications   Medication Sig Start Date End Date Taking? Authorizing Provider  acetaminophen (TYLENOL) 500 MG tablet Take 1,000 mg by mouth as needed for pain.     [provider]  atorvastatin (LIPITOR) 20 MG tablet Take 20 mg by mouth daily at 6 PM. 11/19/13   [provider]  calcium-vitamin D (OSCAL WITH D) 500-200 MG-UNIT per tablet Take 1 tablet by mouth 2 (two) times daily.    [provider]  cholecalciferol (VITAMIN D) 1000 UNITS tablet Take 1,000 Units by mouth daily at 6 PM.     [provider]  FLUZONE HIGH-DOSE 0.5 ML SUSY inject 0.5 milliliter intramuscularly 03/22/15   [provider]  LORazepam (ATIVAN) 0.5 MG tablet Take 0.5 mg by mouth as needed for anxiety.    [provider]  LORazepam (ATIVAN) 2 MG tablet Take 0.5 tablets by mouth daily as needed. 04/18/16   [provider]  magnesium gluconate (MAGONATE) 500 MG tablet Take 500  mg by mouth 2 (two) times daily.    [provider]  omeprazole (PRILOSEC) 40 MG capsule Take 40 mg by mouth every morning.  12/08/12   [provider]  Polyethyl Glycol-Propyl Glycol (SYSTANE OP) Place 1 drop into both eyes daily as needed (for dry eyes).    [provider]  Probiotic Product (PROBIOTIC DAILY PO) Take 1 tablet by mouth every morning.    [provider]  sodium chloride (OCEAN) 0.65 % SOLN nasal spray Place 1 spray into both nostrils daily at 6 PM.    [provider]  traMADol (ULTRAM) 50 MG tablet As needed 03/25/15   [provider]  triamcinolone (NASACORT ALLERGY 24HR) 55 MCG/ACT AERO nasal inhaler Place 2 sprays into both nostrils every morning.    [provider]  triamterene-hydrochlorothiazide (MAXZIDE) 75-50 MG per tablet Take 1 tablet by mouth daily.      [provider]  verapamil (CALAN-SR) 240 MG CR tablet Take 240 mg by  mouth daily.     [provider]    Family History Family History  Problem Relation Age of Onset  . Coronary artery disease Father   . Heart attack Father   . Heart failure Mother   . Colon cancer Brother     Social History Social History   Tobacco Use  . Smoking status: Former Research scientist (life sciences)  . Smokeless tobacco: Never Used  . Tobacco comment: 20 yrs ago.  Substance Use Topics  . Alcohol use: Yes    Alcohol/week: 1.8 oz    Types: 3 Glasses of wine per week  . Drug use: No     Allergies   Codeine and Penicillins   Review of Systems Review of Systems  Unable to perform ROS: Mental status change     Physical Exam Updated Vital Signs LMP 06/11/1976   Physical Exam  Constitutional: She appears well-developed and well-nourished.  HENT:  Head: Normocephalic.  Dried blood on back of head- unable to see laceration on initial exam  Eyes: Conjunctivae and EOM are normal. Pupils are equal, round, and reactive to light.  Neck:  No point ttp  Cardiovascular: Normal rate and regular rhythm.  Pulmonary/Chest: Effort normal and breath sounds normal.  Abdominal: Soft. Bowel sounds are normal.  Musculoskeletal:  No ttp entire spine No visual signs  Of trauma Full arom  Neurological: She is alert.  Oriented to person , not place or time  Skin: Capillary refill takes less than 2 seconds.  Nursing note and vitals reviewed.    ED Treatments / Results  Labs (all labs ordered are listed, but only abnormal results are displayed) Labs Reviewed - No data to display  EKG  EKG Interpretation None       Radiology Ct Head Wo Contrast  Result Date: 06/08/2017 CLINICAL DATA:  Found down. Altered mental status. Large laceration on the back of the head and hematoma over the left temple. EXAM: CT HEAD WITHOUT CONTRAST CT CERVICAL SPINE WITHOUT CONTRAST TECHNIQUE: Multidetector CT imaging of the head and cervical spine was performed following the standard protocol without  intravenous contrast. Multiplanar CT image reconstructions of the cervical spine were also generated. COMPARISON:  MRI and CT head dated September 03, 2013. FINDINGS: CT HEAD FINDINGS Brain: There is an acute left cerebral convexity subdural hematoma, measuring up to a maximal thickness of 15 mm along the left frontal lobe. There is mass effect on the adjacent left frontal gyri. There is no midline shift or herniation. There  is a small amount of subarachnoid blood in the left frontal sulci. Stable cerebral atrophy and chronic microvascular ischemic white matter disease. Vascular: Atherosclerotic vascular calcification of the carotid siphons. No hyperdense vessel. Skull: There is an acute, nondisplaced fracture of the left parietal bone, extending into the greater wing of the sphenoid. There is a nondisplaced fracture of the right side of the occipital bone. Sinuses/Orbits: Layering blood within the bilateral sphenoid sinuses and left ethmoid air cells. The mastoid air cells are clear. Small foci of air within the right infratemporal fossa. Other: Small scalp hematomas over the right occipital bone and left frontal/parietal bones. CT CERVICAL SPINE FINDINGS Alignment: Focal reversal of the normal cervical lordosis at C4. Trace anterolisthesis at C3-C4 and C7-T1. Trace stepwise retrolisthesis at C4-C5 and C5-C6. No traumatic malalignment. Skull base and vertebrae: No acute fracture. No primary bone lesion or focal pathologic process. Soft tissues and spinal canal: No prevertebral fluid or swelling. No visible canal hematoma. Disc levels: Multilevel degenerative changes of the cervical spine, moderate at C4-C5 and C5-C6. At least moderate central spinal canal stenosis at C4-C5 due to posterior disc osteophyte complex. Scattered moderate facet arthropathy, greater on the left. Moderate to severe uncovertebral hypertrophy at C4-C5 and C5-C6 resulting in mild neuroforaminal stenosis on the left at C4-C5 and moderate  neuroforaminal stenosis on the right at C5-C6. Upper chest: Negative. Other: There is a 1.1 cm low-density nodule in the left thyroid lobe. IMPRESSION: 1. Acute left cerebral convexity subdural hematoma, measuring up to 15 mm along the left frontal lobe. Adjacent mass effect on the left frontal gyri, without midline shift or herniation. 2. Small amount of subarachnoid hemorrhage in the left frontal sulci. 3. Acute, nondisplaced fracture of the left parietal bone, extending into the greater wing of the sphenoid. 4. Nondisplaced fracture through the right aspect of the occipital bone. 5. Layering blood within the bilateral sphenoid sinuses and left ethmoid air cells, with small amount of air in the right infratemporal fossa. Findings are concerning for additional facial fractures, and maxillofacial CT is recommended for further evaluation. 6. No acute cervical spine fracture. Degenerative changes, moderate at C4-C5 and C5-C6. Critical Value/emergent results were called by telephone at the time of interpretation on 06/08/2017 at 8:59 am to Dr. Pattricia Boss , who verbally acknowledged these results. Electronically Signed   By: Titus Dubin M.D.   On: 06/08/2017 08:59   Ct Cervical Spine Wo Contrast  Result Date: 06/08/2017 CLINICAL DATA:  Found down. Altered mental status. Large laceration on the back of the head and hematoma over the left temple. EXAM: CT HEAD WITHOUT CONTRAST CT CERVICAL SPINE WITHOUT CONTRAST TECHNIQUE: Multidetector CT imaging of the head and cervical spine was performed following the standard protocol without intravenous contrast. Multiplanar CT image reconstructions of the cervical spine were also generated. COMPARISON:  MRI and CT head dated September 03, 2013. FINDINGS: CT HEAD FINDINGS Brain: There is an acute left cerebral convexity subdural hematoma, measuring up to a maximal thickness of 15 mm along the left frontal lobe. There is mass effect on the adjacent left frontal gyri. There is  no midline shift or herniation. There is a small amount of subarachnoid blood in the left frontal sulci. Stable cerebral atrophy and chronic microvascular ischemic white matter disease. Vascular: Atherosclerotic vascular calcification of the carotid siphons. No hyperdense vessel. Skull: There is an acute, nondisplaced fracture of the left parietal bone, extending into the greater wing of the sphenoid. There is a nondisplaced fracture of the  right side of the occipital bone. Sinuses/Orbits: Layering blood within the bilateral sphenoid sinuses and left ethmoid air cells. The mastoid air cells are clear. Small foci of air within the right infratemporal fossa. Other: Small scalp hematomas over the right occipital bone and left frontal/parietal bones. CT CERVICAL SPINE FINDINGS Alignment: Focal reversal of the normal cervical lordosis at C4. Trace anterolisthesis at C3-C4 and C7-T1. Trace stepwise retrolisthesis at C4-C5 and C5-C6. No traumatic malalignment. Skull base and vertebrae: No acute fracture. No primary bone lesion or focal pathologic process. Soft tissues and spinal canal: No prevertebral fluid or swelling. No visible canal hematoma. Disc levels: Multilevel degenerative changes of the cervical spine, moderate at C4-C5 and C5-C6. At least moderate central spinal canal stenosis at C4-C5 due to posterior disc osteophyte complex. Scattered moderate facet arthropathy, greater on the left. Moderate to severe uncovertebral hypertrophy at C4-C5 and C5-C6 resulting in mild neuroforaminal stenosis on the left at C4-C5 and moderate neuroforaminal stenosis on the right at C5-C6. Upper chest: Negative. Other: There is a 1.1 cm low-density nodule in the left thyroid lobe. IMPRESSION: 1. Acute left cerebral convexity subdural hematoma, measuring up to 15 mm along the left frontal lobe. Adjacent mass effect on the left frontal gyri, without midline shift or herniation. 2. Small amount of subarachnoid hemorrhage in the left  frontal sulci. 3. Acute, nondisplaced fracture of the left parietal bone, extending into the greater wing of the sphenoid. 4. Nondisplaced fracture through the right aspect of the occipital bone. 5. Layering blood within the bilateral sphenoid sinuses and left ethmoid air cells, with small amount of air in the right infratemporal fossa. Findings are concerning for additional facial fractures, and maxillofacial CT is recommended for further evaluation. 6. No acute cervical spine fracture. Degenerative changes, moderate at C4-C5 and C5-C6. Critical Value/emergent results were called by telephone at the time of interpretation on 06/08/2017 at 8:59 am to Dr. Pattricia Boss , who verbally acknowledged these results. Electronically Signed   By: Titus Dubin M.D.   On: 06/08/2017 08:59   Dg Chest Port 1 View  Result Date: 06/08/2017 CLINICAL DATA:  Altered mental status.  Hypoxia. EXAM: PORTABLE CHEST 1 VIEW COMPARISON:  11/23/2013 FINDINGS: Numerous leads and wires project over the chest. Midline trachea. Normal heart size. Atherosclerosis in the transverse aorta. Tortuous thoracic aorta. No pleural effusion or pneumothorax. Clear lungs. IMPRESSION: No acute cardiopulmonary disease. Aortic Atherosclerosis (ICD10-I70.0). Electronically Signed   By: Abigail Miyamoto M.D.   On: 06/08/2017 08:29    Procedures Procedures (including critical care time)  Medications Ordered in ED Medications - No data to display   Initial Impression / Assessment and Plan / ED Course  I have reviewed the triage vital signs and the nursing notes.  Pertinent labs & imaging results that were available during my care of the patient were reviewed by me and considered in my medical decision making (see chart for details).     83 y.o female from home, lives independently with sdh and skull fractures.  Niece and great niece at bedside.  Daughter en route from [place].  Family reports that patient would not want aggressive intervention,  but daughter has papers and is Media planner.   Neurosurgery consulted. Ct maxillofacial consulted. Mild hypokalemia, will replete Probable admission to hospitalist.   Vitals:   06/08/17 0900 06/08/17 0905  BP: (!) 149/48   Pulse: 79   Resp: (!) 26   Temp:  98.3 F (36.8 C)  SpO2: 97%  Discussed with Phillips Odor , PA on for neurosurgery and he will see in consult Discussed with Sharene Butters and Dr. Eliseo Squires on for hosptialist and patient will be admitted for comfort care- DNR/DNI per daughter Final Clinical Impressions(s) / ED Diagnoses   Final diagnoses:  SDH (subdural hematoma) ([place])  Open fracture of skull, unspecified bone, initial encounter [facility])    ED Discharge Orders    None       Pattricia Boss, MD 06/08/17 1517    Pattricia Boss, MD 06/08/17 [id number]

## 2017-06-08 NOTE — H&P (Signed)
History and Physical    Lori Boone ZCH:[id number] DOB: 26-Mar-1926 DOA: 06/08/2017   PCP: Burnard Bunting, MD   Patient coming from:  Home    Chief Complaint: Fall with head laceration   HPI: Lori Boone is a 83 y.o. female with medical history significant for hypertension, hyperlipidemia, osteopenia, history of PACs, mild dementia, GERD, brought to the emergency department after being found at home, on the floor.  History is given by family at bedside, patient is very lethargic, and unable to provide further history.  She is not confused however.  By report, the house alarm was going off, and the alarm company notified the [employer].  When the police arrived, the patient was on the floor, with dried blood next to her, the nurse still on the table.  Family suspects that the patient fell prior to dinner, and 6 and 7 PM.  Then, the patient was able to perform simple ADLs.  On arrival, the patient's main complaint was of headaches.  According to her daughter, no recent other falls were reported.  It is unclear if she had a syncopal or presyncopal episode prior to this event. She does have a history of syncope in 2013 with unrevealing results as per event monitor and carotid ultrasound . At the ER, she became nauseous, but no vomiting was noted.  She received Zofran with improvement of her symptoms.  She denies any chest pain, palpitations, shortness of breath, abdominal pain, leg swelling, or back pain.  She denies any urine or bowel incontinence following the events.  She denies a history of seizures.  No recent surgery.  No recent long distance travel.  She is compliant with her medications.  She is not on aspirin or any other anticoagulants.  She denies any hematuria, hemoptysis, epistaxis, or gum bleed.   ED Course:  BP (!) 145/64   Pulse 77   Temp 98.3 F (36.8 C) (Rectal)   Resp 16   LMP 06/11/1976   SpO2 97%   CT of the head was unremarkable for subdural hematoma, as well as a small  subarachnoid hemorrhage.  Neurosurgery has seen and evaluated the patient, recommending to admit for monitoring, neuro checks.  Keppra was initiated twice daily for 7 days for seizure prophylaxis.  They also recommended CT of the head in a.m. CT maxillofacial shows 1. Probable nondisplaced fractures of the left lamina papyracea and right zygoma along the lateral wall of the right orbit. 2. Nondisplaced fracture of the left zygomatic arch.3. Unchanged layering hemorrhage in the bilateral sphenoid sinuses and left ethmoid air cells. CT spine neg for fracture  WBC 15  Potassium was 3.1, but was replenished at the ED Bicarb is 24.  On presentation, her O2 sats were in the low 90s, but this has improved with 2 L of oxygen. Glucose 185 CK is normal at 95 Troponin negative at 0.01 Lactic acid 1.56 The patient is limited code, with no intubation, but other measures such as chest compressions, cardiac meds are allowed. Maxillofacial surgery consultation is pending  Review of Systems:  As per HPI otherwise all other systems reviewed and are negative  Past Medical History:  Diagnosis Date  . Broken ankle   . Diverticulitis   . Hyperlipidemia   . Hypertension   . Mastodynia   . Meniscus tear    trying cortisone injections with Dr Ronnie Derby, right leg  . Osteopenia   . Ovarian cyst    h/o  . PAC (premature atrial contraction)   .  Stomach ulcer 11/10/12   dx with endoscopy, will repeat in 8/14 and will stretch esophagus    Past Surgical History:  Procedure Laterality Date  . ANKLE SURGERY  1987  . [place]  . cataracts    . MOUTH SURGERY     implants    Social History Social History   Socioeconomic History  . Marital status: Widowed    Spouse name: Not on file  . Number of children: 2  . Years of education: Not on file  . Highest education level: Not on file  Social Needs  . Financial resource strain: Not on file  . Food insecurity - worry: Not on file  . Food insecurity  - inability: Not on file  . Transportation needs - medical: Not on file  . Transportation needs - non-medical: Not on file  Occupational History  . Occupation: Retired Marketing executive work  Tobacco Use  . Smoking status: Former Research scientist (life sciences)  . Smokeless tobacco: Never Used  . Tobacco comment: 20 yrs ago.  Substance and Sexual Activity  . Alcohol use: Yes    Alcohol/week: 1.8 oz    Types: 3 Glasses of wine per week  . Drug use: No  . Sexual activity: No  Other Topics Concern  . Not on file  Social History Narrative  . Not on file     Allergies  Allergen Reactions  . Codeine Nausea Only  . Penicillins     Makes patient not feel well all over    Family History  Problem Relation Age of Onset  . Coronary artery disease Father   . Heart attack Father   . Heart failure Mother   . Colon cancer Brother       Prior to Admission medications   Medication Sig Start Date End Date Taking? Authorizing Provider  acetaminophen (TYLENOL) 500 MG tablet Take 1,000 mg by mouth as needed for pain.     [provider]  atorvastatin (LIPITOR) 20 MG tablet Take 20 mg by mouth daily at 6 PM. 11/19/13   [provider]  calcium-vitamin D (OSCAL WITH D) 500-200 MG-UNIT per tablet Take 1 tablet by mouth 2 (two) times daily.    [provider]  cholecalciferol (VITAMIN D) 1000 UNITS tablet Take 1,000 Units by mouth daily at 6 PM.     [provider]  FLUZONE HIGH-DOSE 0.5 ML SUSY inject 0.5 milliliter intramuscularly 03/22/15   [provider]  LORazepam (ATIVAN) 0.5 MG tablet Take 0.5 mg by mouth as needed for anxiety.    [provider]  LORazepam (ATIVAN) 2 MG tablet Take 0.5 tablets by mouth daily as needed. 04/18/16   [provider]  magnesium gluconate (MAGONATE) 500 MG tablet Take 500 mg by mouth 2 (two) times daily.    [provider]  omeprazole (PRILOSEC) 40 MG capsule Take 40 mg by mouth every morning.  12/08/12   [provider]  Polyethyl Glycol-Propyl Glycol (SYSTANE OP) Place 1 drop into both eyes daily as needed (for dry eyes).    [provider]  Probiotic Product (PROBIOTIC DAILY PO) Take 1 tablet by mouth every morning.    [provider]  sodium chloride (OCEAN) 0.65 % SOLN nasal spray Place 1 spray into both nostrils daily at 6 PM.    [provider]  traMADol (ULTRAM) 50 MG tablet As needed 03/25/15   [provider]  triamcinolone (NASACORT ALLERGY 24HR) 55 MCG/ACT AERO nasal inhaler Place 2 sprays  into both nostrils every morning.    [provider]  triamterene-hydrochlorothiazide (MAXZIDE) 75-50 MG per tablet Take 1 tablet by mouth daily.      [provider]  verapamil (CALAN-SR) 240 MG CR tablet Take 240 mg by mouth daily.     [provider]    Physical Exam:  Vitals:   06/08/17 0905 06/08/17 0930 06/08/17 1000 06/08/17 1030  BP:  (!) 148/61 (!) 150/61 (!) 145/64  Pulse:  76 77 77  Resp:  (!) 23 (!) 21 16  Temp: 98.3 F (36.8 C)     TempSrc: Rectal     SpO2:  98% 97% 97%   Constitutional: NAD, calm, frail appearing, lethargic Eyes: PERRL, lids and conjunctivae normal ENMT: lips are moist, some dry blood from lips is seen, no active bleed. Unable to perform oral exam due to maxillofacial fracture Neck: normal, supple, no masses, no thyromegaly Respiratory: clear to auscultation bilaterally, no wheezing, no crackles. Normal respiratory effort  Cardiovascular: Regular rate and rhythm, very soft 1/6   murmur, rubs or gallops. No extremity edema. 2+ pedal pulses. No carotid bruits.  Abdomen: Soft, non tender, No hepatosplenomegaly. Bowel sounds positive.  Musculoskeletal: no clubbing / cyanosis. Moves all extremities. No racoon eyes noted  Skin: no jaundice, No lesions.  Neurologic: Sensation intact  Strength equal in all extremities Unable to test gait. PERRLA,EOMI     Labs on Admission: I have personally reviewed  following labs and imaging studies  CBC: Recent Labs  Lab 06/08/17 0741  WBC 15.2*  NEUTROABS 13.8*  HGB 13.1  HCT 38.2  MCV 90.1  PLT 381    Basic Metabolic Panel: Recent Labs  Lab 06/08/17 0741  NA 136  K 3.1*  CL 102  CO2 24  GLUCOSE 185*  BUN 24*  CREATININE 0.81  CALCIUM 9.1    GFR: CrCl cannot be calculated (Unknown ideal weight.).  Liver Function Tests: Recent Labs  Lab 06/08/17 0741  AST 35  ALT 22  ALKPHOS 108  BILITOT 0.9  PROT 6.0*  ALBUMIN 3.4*   No results for input(s): LIPASE, AMYLASE in the last 168 hours. No results for input(s): AMMONIA in the last 168 hours.  Coagulation Profile: No results for input(s): INR, PROTIME in the last 168 hours.  Cardiac Enzymes: Recent Labs  Lab 06/08/17 0741  CKTOTAL 95    BNP (last 3 results) No results for input(s): PROBNP in the last 8760 hours.  HbA1C: No results for input(s): HGBA1C in the last 72 hours.  CBG: No results for input(s): GLUCAP in the last 168 hours.  Lipid Profile: No results for input(s): CHOL, HDL, LDLCALC, TRIG, CHOLHDL, LDLDIRECT in the last 72 hours.  Thyroid Function Tests: No results for input(s): TSH, T4TOTAL, FREET4, T3FREE, THYROIDAB in the last 72 hours.  Anemia Panel: No results for input(s): VITAMINB12, FOLATE, FERRITIN, TIBC, IRON, RETICCTPCT in the last 72 hours.  Urine analysis:    Component Value Date/Time   COLORURINE YELLOW 06/08/2017 0732   APPEARANCEUR CLEAR 06/08/2017 0732   LABSPEC 1.018 06/08/2017 0732   PHURINE 6.0 06/08/2017 0732   GLUCOSEU NEGATIVE 06/08/2017 0732   HGBUR NEGATIVE 06/08/2017 0732   BILIRUBINUR NEGATIVE 06/08/2017 0732   KETONESUR 5 (A) 06/08/2017 0732   PROTEINUR NEGATIVE 06/08/2017 0732   UROBILINOGEN 0.2 11/23/2013 2308   NITRITE NEGATIVE 06/08/2017 0732   LEUKOCYTESUR NEGATIVE 06/08/2017 0732    Sepsis Labs: @LABRCNTIP (procalcitonin:4,lacticidven:4) )No results found for this or any previous visit (from the past  240 hour(s)).   Radiological Exams on Admission: Ct Head Wo Contrast  Result Date: 06/08/2017 CLINICAL DATA:  Found down. Altered mental status. Large laceration on the back of the head and hematoma over the left temple. EXAM: CT HEAD WITHOUT CONTRAST CT CERVICAL SPINE WITHOUT CONTRAST TECHNIQUE: Multidetector CT imaging of the head and cervical spine was performed following the standard protocol without intravenous contrast. Multiplanar CT image reconstructions of the cervical spine were also generated. COMPARISON:  MRI and CT head dated September 03, 2013. FINDINGS: CT HEAD FINDINGS Brain: There is an acute left cerebral convexity subdural hematoma, measuring up to a maximal thickness of 15 mm along the left frontal lobe. There is mass effect on the adjacent left frontal gyri. There is no midline shift or herniation. There is a small amount of subarachnoid blood in the left frontal sulci. Stable cerebral atrophy and chronic microvascular ischemic white matter disease. Vascular: Atherosclerotic vascular calcification of the carotid siphons. No hyperdense vessel. Skull: There is an acute, nondisplaced fracture of the left parietal bone, extending into the greater wing of the sphenoid. There is a nondisplaced fracture of the right side of the occipital bone. Sinuses/Orbits: Layering blood within the bilateral sphenoid sinuses and left ethmoid air cells. The mastoid air cells are clear. Small foci of air within the right infratemporal fossa. Other: Small scalp hematomas over the right occipital bone and left frontal/parietal bones. CT CERVICAL SPINE FINDINGS Alignment: Focal reversal of the normal cervical lordosis at C4. Trace anterolisthesis at C3-C4 and C7-T1. Trace stepwise retrolisthesis at C4-C5 and C5-C6. No traumatic malalignment. Skull base and vertebrae: No acute fracture. No primary bone lesion or focal pathologic process. Soft tissues and spinal canal: No prevertebral fluid or swelling. No visible canal  hematoma. Disc levels: Multilevel degenerative changes of the cervical spine, moderate at C4-C5 and C5-C6. At least moderate central spinal canal stenosis at C4-C5 due to posterior disc osteophyte complex. Scattered moderate facet arthropathy, greater on the left. Moderate to severe uncovertebral hypertrophy at C4-C5 and C5-C6 resulting in mild neuroforaminal stenosis on the left at C4-C5 and moderate neuroforaminal stenosis on the right at C5-C6. Upper chest: Negative. Other: There is a 1.1 cm low-density nodule in the left thyroid lobe. IMPRESSION: 1. Acute left cerebral convexity subdural hematoma, measuring up to 15 mm along the left frontal lobe. Adjacent mass effect on the left frontal gyri, without midline shift or herniation. 2. Small amount of subarachnoid hemorrhage in the left frontal sulci. 3. Acute, nondisplaced fracture of the left parietal bone, extending into the greater wing of the sphenoid. 4. Nondisplaced fracture through the right aspect of the occipital bone. 5. Layering blood within the bilateral sphenoid sinuses and left ethmoid air cells, with small amount of air in the right infratemporal fossa. Findings are concerning for additional facial fractures, and maxillofacial CT is recommended for further evaluation. 6. No acute cervical spine fracture. Degenerative changes, moderate at C4-C5 and C5-C6. Critical Value/emergent results were called by telephone at the time of interpretation on 06/08/2017 at 8:59 am to Dr. Pattricia Boss , who verbally acknowledged these results. Electronically Signed   By: Titus Dubin M.D.   On: 06/08/2017 08:59   Ct Cervical Spine Wo Contrast  Result Date: 06/08/2017 CLINICAL DATA:  Found down. Altered mental status. Large laceration on the back of the head and hematoma over the left temple. EXAM: CT HEAD WITHOUT CONTRAST CT CERVICAL SPINE WITHOUT CONTRAST TECHNIQUE: Multidetector CT imaging of the head and cervical spine was performed following  the standard  protocol without intravenous contrast. Multiplanar CT image reconstructions of the cervical spine were also generated. COMPARISON:  MRI and CT head dated September 03, 2013. FINDINGS: CT HEAD FINDINGS Brain: There is an acute left cerebral convexity subdural hematoma, measuring up to a maximal thickness of 15 mm along the left frontal lobe. There is mass effect on the adjacent left frontal gyri. There is no midline shift or herniation. There is a small amount of subarachnoid blood in the left frontal sulci. Stable cerebral atrophy and chronic microvascular ischemic white matter disease. Vascular: Atherosclerotic vascular calcification of the carotid siphons. No hyperdense vessel. Skull: There is an acute, nondisplaced fracture of the left parietal bone, extending into the greater wing of the sphenoid. There is a nondisplaced fracture of the right side of the occipital bone. Sinuses/Orbits: Layering blood within the bilateral sphenoid sinuses and left ethmoid air cells. The mastoid air cells are clear. Small foci of air within the right infratemporal fossa. Other: Small scalp hematomas over the right occipital bone and left frontal/parietal bones. CT CERVICAL SPINE FINDINGS Alignment: Focal reversal of the normal cervical lordosis at C4. Trace anterolisthesis at C3-C4 and C7-T1. Trace stepwise retrolisthesis at C4-C5 and C5-C6. No traumatic malalignment. Skull base and vertebrae: No acute fracture. No primary bone lesion or focal pathologic process. Soft tissues and spinal canal: No prevertebral fluid or swelling. No visible canal hematoma. Disc levels: Multilevel degenerative changes of the cervical spine, moderate at C4-C5 and C5-C6. At least moderate central spinal canal stenosis at C4-C5 due to posterior disc osteophyte complex. Scattered moderate facet arthropathy, greater on the left. Moderate to severe uncovertebral hypertrophy at C4-C5 and C5-C6 resulting in mild neuroforaminal stenosis on the left at C4-C5 and  moderate neuroforaminal stenosis on the right at C5-C6. Upper chest: Negative. Other: There is a 1.1 cm low-density nodule in the left thyroid lobe. IMPRESSION: 1. Acute left cerebral convexity subdural hematoma, measuring up to 15 mm along the left frontal lobe. Adjacent mass effect on the left frontal gyri, without midline shift or herniation. 2. Small amount of subarachnoid hemorrhage in the left frontal sulci. 3. Acute, nondisplaced fracture of the left parietal bone, extending into the greater wing of the sphenoid. 4. Nondisplaced fracture through the right aspect of the occipital bone. 5. Layering blood within the bilateral sphenoid sinuses and left ethmoid air cells, with small amount of air in the right infratemporal fossa. Findings are concerning for additional facial fractures, and maxillofacial CT is recommended for further evaluation. 6. No acute cervical spine fracture. Degenerative changes, moderate at C4-C5 and C5-C6. Critical Value/emergent results were called by telephone at the time of interpretation on 06/08/2017 at 8:59 am to Dr. Pattricia Boss , who verbally acknowledged these results. Electronically Signed   By: Titus Dubin M.D.   On: 06/08/2017 08:59   Dg Chest Port 1 View  Result Date: 06/08/2017 CLINICAL DATA:  Altered mental status.  Hypoxia. EXAM: PORTABLE CHEST 1 VIEW COMPARISON:  11/23/2013 FINDINGS: Numerous leads and wires project over the chest. Midline trachea. Normal heart size. Atherosclerosis in the transverse aorta. Tortuous thoracic aorta. No pleural effusion or pneumothorax. Clear lungs. IMPRESSION: No acute cardiopulmonary disease. Aortic Atherosclerosis (ICD10-I70.0). Electronically Signed   By: Abigail Miyamoto M.D.   On: 06/08/2017 08:29   Ct Maxillofacial Wo Contrast  Result Date: 06/08/2017 CLINICAL DATA:  Patient found down. Acute subdural hematoma on CT head. Evaluate for facial fracture. EXAM: CT MAXILLOFACIAL WITHOUT CONTRAST TECHNIQUE: Multidetector CT  imaging of the  maxillofacial structures was performed. Multiplanar CT image reconstructions were also generated. COMPARISON:  CT head and cervical spine from same day. FINDINGS: Osseous: There is a nondisplaced fracture through the left zygomatic arch. Probable nondisplaced fracture of the left lamina papyracea, with a few small foci of air noted along the medial left orbital wall. Probable nondisplaced fracture of the right zygoma along the lateral wall of the right orbit, with a few small foci of air noted posteriorly in the infratemporal fossa. The left parietal bone fracture extending into the greater wing of the left sphenoid bone is again seen. 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 No mandible fracture. Orbits: Negative. No traumatic or inflammatory finding. Sinuses: Layering blood products in the bilateral sphenoid sinuses and left ethmoid air cells. Remaining paranasal sinuses and mastoid air cells are clear. Soft tissues: Negative. Limited intracranial: Partially visualized left cerebral convexity subdural hematoma, better evaluated on CT head from same day. IMPRESSION: 1. Probable nondisplaced fractures of the left lamina papyracea and right zygoma along the lateral wall of the right orbit. 2. Nondisplaced fracture of the left zygomatic arch. 3. Unchanged layering hemorrhage in the bilateral sphenoid sinuses and left ethmoid air cells. Electronically Signed   By: Titus Dubin M.D.   On: 06/08/2017 10:19    EKG: Independently reviewed.  Assessment/Plan Active Problems:   Hypertension   SDH (subdural hematoma) (HCC)   Hyperlipidemia   Elevated glucose level   GERD (gastroesophageal reflux disease)    Fall with skull fractures, occipital condyle fracture, SDH/ SAH as evidenced by CT head, maxillofacial .  Unclear if the patient syncopized, or had a mechanical fall, versus overmedication. History of syncope in 2013, Event monitor and carotid ultrasound unrevealing. Neurosurgery  consultation was obtained, she is not an operative candidate.  Maxillofacial consult is pending.  White count is 15, likely reactive.Bicarb is 24.  On presentation, her O2 sats were in the low 90s, but this has improved with 2 L of oxygen. CK is normal at 95. Troponin negative at 0.01. Lactic acid 1.56. UA negative  Vital signs are stable, she is afebrile.  Had extensive conversation with daughter who is the power of attorney, who concluded that the patient is not to be DNR for now, and to be switched to limited code, with no intubation, but other measures such as chest compressions, cardiac meds are allowed.   Admit to stepdown telemetry Neuro exam every 1-2 hours Repeat CT in the morning, sooner if indicated by exam Keppra 500 mg twice daily for 7 days for seizure prophylaxis EKG 2 D echo  Await maxillofacial surgery consultation Pain control PT/OT  Continue Zofran for nausea  If symptoms worsen, family is willing to reevaluate goals of care and revise code status   Elevated glucose, 185, UA with small ketones . no history of diabetes A1C CBG bid   Hypertension BP  145/64   Pulse 77  Continue home anti-hypertensive medications  Add Hydralazine Q6 hours as needed for BP 200/100  Hyperlipidemia Continue home statins   Osteoporosis Continue OsCal, Vit D   GERD, no acute symptoms Continue PPI   DVT prophylaxis: SCDs Code Status:    Limited, as above Family Communication:  Discussed with patient and daughters Disposition Plan: Expect patient to be discharged to home after condition improves Consults called:    Neurosurgery and maxillofacial surgery as per EDP Admission status: SDU tele   Sharene Butters, PA-C [facility]   06/08/2017, 11:27 AM

## 2017-06-09 ENCOUNTER — Inpatient Hospital Stay (HOSPITAL_COMMUNITY): Payer: Medicare Other

## 2017-06-09 DIAGNOSIS — S0292XA Unspecified fracture of facial bones, initial encounter for closed fracture: Secondary | ICD-10-CM

## 2017-06-09 DIAGNOSIS — S065X9A Traumatic subdural hemorrhage with loss of consciousness of unspecified duration, initial encounter: Secondary | ICD-10-CM

## 2017-06-09 DIAGNOSIS — S0291XB Unspecified fracture of skull, initial encounter for open fracture: Secondary | ICD-10-CM

## 2017-06-09 LAB — COMPREHENSIVE METABOLIC PANEL
ALT: 18 U/L (ref 14–54)
ANION GAP: 10 (ref 5–15)
AST: 24 U/L (ref 15–41)
Albumin: 2.7 g/dL — ABNORMAL LOW (ref 3.5–5.0)
Alkaline Phosphatase: 83 U/L (ref 38–126)
BUN: 23 mg/dL — AB (ref 6–20)
CHLORIDE: 103 mmol/L (ref 101–111)
CO2: 23 mmol/L (ref 22–32)
Calcium: 7.9 mg/dL — ABNORMAL LOW (ref 8.9–10.3)
Creatinine, Ser: 0.72 mg/dL (ref 0.44–1.00)
GFR calc Af Amer: >60 mL/min (ref >60)
Glucose, Bld: 124 mg/dL — ABNORMAL HIGH (ref 65–99)
POTASSIUM: 3.2 mmol/L — AB (ref 3.5–5.1)
Sodium: 136 mmol/L (ref 135–145)
Total Bilirubin: 1 mg/dL (ref 0.3–1.2)
Total Protein: 5.2 g/dL — ABNORMAL LOW (ref 6.5–8.1)

## 2017-06-09 LAB — CBC
HEMATOCRIT: 36.5 % (ref 36.0–46.0)
HEMOGLOBIN: 11.7 g/dL — AB (ref 12.0–15.0)
MCH: 30.2 pg (ref 26.0–34.0)
MCHC: 32.1 g/dL (ref 30.0–36.0)
MCV: 94.1 fL (ref 78.0–100.0)
Platelets: 215 10*3/uL (ref 150–400)
RBC: 3.88 MIL/uL (ref 3.87–5.11)
RDW: 13.8 % (ref 11.5–15.5)
WBC: 16.4 10*3/uL — AB (ref 4.0–10.5)

## 2017-06-09 LAB — GLUCOSE, CAPILLARY
Glucose-Capillary: 115 mg/dL — ABNORMAL HIGH (ref 65–99)
Glucose-Capillary: 201 mg/dL — ABNORMAL HIGH (ref 65–99)

## 2017-06-09 LAB — PROTIME-INR
INR: 1.13
Prothrombin Time: 14.4 seconds (ref 11.4–15.2)

## 2017-06-09 MED ORDER — HYDROCODONE-ACETAMINOPHEN 5-325 MG PO TABS
1.0000 | ORAL_TABLET | Freq: Two times a day (BID) | ORAL | Status: DC | PRN
  Administered 2017-06-09: 1 via ORAL

## 2017-06-09 MED ORDER — POTASSIUM CHLORIDE CRYS ER 20 MEQ PO TBCR
40.0000 meq | EXTENDED_RELEASE_TABLET | Freq: Every day | ORAL | Status: DC
  Administered 2017-06-09 – 2017-06-17 (×9): 40 meq via ORAL
  Filled 2017-06-09 (×9): qty 2

## 2017-06-09 MED ORDER — HYDROCODONE-ACETAMINOPHEN 5-325 MG PO TABS
1.0000 | ORAL_TABLET | Freq: Three times a day (TID) | ORAL | Status: DC
  Filled 2017-06-09: qty 1

## 2017-06-09 MED ORDER — LEVETIRACETAM 500 MG PO TABS
500.0000 mg | ORAL_TABLET | Freq: Two times a day (BID) | ORAL | Status: AC
  Administered 2017-06-09 – 2017-06-15 (×11): 500 mg via ORAL
  Filled 2017-06-09 (×11): qty 1

## 2017-06-09 NOTE — Progress Notes (Signed)
Orthopedic Tech Progress Note Patient Details:  Lori Boone 09-30-1925 [id number]  Ortho Devices Type of Ortho Device: Soft collar Ortho Device/Splint Interventions: Application   Post Interventions Patient Tolerated: Well Instructions Provided: Care of device   [place] Pink 06/09/2017, 3:05 PM

## 2017-06-09 NOTE — Progress Notes (Signed)
Hospitalist progress note         Lori Boone  XNA:[id number] DOB: May 30, 1926 DOA: 06/08/2017 PCP: Burnard Bunting, MD   Specialists:   Brief Narrative:  78 fem hld htn simple ovarian cysts stable since 2017 prior near syncope2013 with no cause mld dementia, reflux--sustaine dprobable mech fall NOted SDH and Subarachnoid hemorrhage, fractures of L lamna + r zygoma, ct spine neg Started on keppra 500 bid x 7 days for Sz prophylaxis NS consulted by ED and ENT consulted as well  Assessment & Plan:   Assessment:  The primary encounter diagnosis was SDH (subdural hematoma) ([place]). A diagnosis of Open fracture of skull, unspecified bone, initial encounter [facility]) was also pertinent to this visit.  Syncope and fall-monitor on SDU for right now-can probably trasnfer out in 24 ours if no further deterioration-if deteriorates is only partial code Zygomal and facial fractures-ENT to comment on feasibility of repair.  Appreciate Dr. Benjamine Mola input Subarachnoid subdural hemorrhage-appreciate input from neurosurgery.  No role for operative management at this time-keep c-collar at all times control pain and will need 4 weeks outpatient follow-up-continue Keppra 500 twice daily for seizure prophylaxis and discontinue 7 days after admission HTn-continue Maxzide 1 tab daily, verapamil 240 daily   DVT prophylaxis: lovenox Code Status: Partial  Family Communication: d/w daughter Disposition Plan:  sdu for now-transfer out in am if all stable-need eventual therapy eval and ? SNF   Consultants:   NS  ENt   Procedures:   Ct head  Antimicrobials:   none   Subjective:  rouses but confused not awake enough for po  Objective: Vitals:   06/08/17 1947 06/08/17 2000 06/08/17 2338 06/09/17 0253  BP:  131/89    Pulse:  79  81  Resp:  14  16  Temp: 99.6 F (37.6 C)  98.6 F (37 C) 97.9 F (36.6 C)  TempSrc: Oral  Oral Oral  SpO2:  95%  97%  Weight:      Height:        Intake/Output Summary (Last  24 hours) at 06/09/2017 0726 Last data filed at 06/09/2017 0350 Gross per 24 hour  Intake 1705 ml  Output 800 ml  Net 905 ml   Filed Weights   06/08/17 1744 06/08/17 1753  Weight: 59 kg (130 lb) 56.7 kg (125 lb)    Examination: eomi ncat no evidence hematoma s1 s2 no m/r/g cta b abd soft follw commands Responds 1 word answers Lifting arms and legs above bed  Data Reviewed: I have personally reviewed following labs and imaging studies  CBC: Recent Labs  Lab 06/08/17 0741 06/09/17 0511  WBC 15.2* 16.4*  NEUTROABS 13.8*  --   HGB 13.1 11.7*  HCT 38.2 36.5  MCV 90.1 94.1  PLT 267 542   Basic Metabolic Panel: Recent Labs  Lab 06/08/17 0741 06/09/17 0511  NA 136 136  K 3.1* 3.2*  CL 102 103  CO2 24 23  GLUCOSE 185* 124*  BUN 24* 23*  CREATININE 0.81 0.72  CALCIUM 9.1 7.9*   GFR: Estimated Creatinine Clearance: 37.6 mL/min (by C-G formula based on SCr of 0.72 mg/dL). Liver Function Tests: Recent Labs  Lab 06/08/17 0741 06/09/17 0511  AST 35 24  ALT 22 18  ALKPHOS 108 83  BILITOT 0.9 1.0  PROT 6.0* 5.2*  ALBUMIN 3.4* 2.7*   No results for input(s): LIPASE, AMYLASE in the last 168 hours. No results for input(s): AMMONIA in the last 168 hours. Coagulation Profile: Recent Labs  Lab 06/09/17 0511  INR 1.13   Cardiac Enzymes: Recent Labs  Lab 06/08/17 0741  CKTOTAL 95   CBG: No results for input(s): GLUCAP in the last 168 hours. Urine analysis:    Component Value Date/Time   COLORURINE YELLOW 06/08/2017 0732   APPEARANCEUR CLEAR 06/08/2017 0732   LABSPEC 1.018 06/08/2017 0732   PHURINE 6.0 06/08/2017 0732   GLUCOSEU NEGATIVE 06/08/2017 0732   HGBUR NEGATIVE 06/08/2017 0732   BILIRUBINUR NEGATIVE 06/08/2017 0732   KETONESUR 5 (A) 06/08/2017 0732   PROTEINUR NEGATIVE 06/08/2017 0732   UROBILINOGEN 0.2 11/23/2013 2308   NITRITE NEGATIVE 06/08/2017 0732   LEUKOCYTESUR NEGATIVE 06/08/2017 0732     Radiology Studies: Reviewed images  personally in health database    Scheduled Meds: . atorvastatin  20 mg Oral q1800  . calcium-vitamin D  1 tablet Oral BID WC  . cholecalciferol  1,000 Units Oral q1800  . pantoprazole  40 mg Oral Daily  . triamterene-hydrochlorothiazide  1 tablet Oral Daily  . verapamil  240 mg Oral Daily   Continuous Infusions: . sodium chloride 1,000 mL (06/08/17 1917)  . levETIRAcetam Stopped (06/08/17 2352)     LOS: 1 day    Time spent: [place], MD [facility]   If 7PM-7AM, please contact night-coverage [email or web address] Password [email or web address] 06/09/2017, 7:26 AM

## 2017-06-09 NOTE — Consult Note (Signed)
Reason for Consult: Facial fractures, syncope, fall  HPI:  Lori Boone is an 83 y.o. female who was transported by EMS to [facility] ER yesterday after she was found on the ground with altered mental status. There was blood on the floor of the kitchen. The patient has a history of dementia. She was noted to have SDH and nondisplaced fractures of her bilateral zygoma, left lamina papyracea and right lateral orbital wall.   Past Medical History:  Diagnosis Date  . Broken ankle   . Diverticulitis   . Hyperlipidemia   . Hypertension   . Mastodynia   . Meniscus tear    trying cortisone injections with Dr Ronnie Derby, right leg  . Osteopenia   . Ovarian cyst    h/o  . PAC (premature atrial contraction)   . Stomach ulcer 11/10/12   dx with endoscopy, will repeat in 8/14 and will stretch esophagus    Past Surgical History:  Procedure Laterality Date  . ANKLE SURGERY  1987  . [place]  . cataracts    . MOUTH SURGERY     implants    Family History  Problem Relation Age of Onset  . Coronary artery disease Father   . Heart attack Father   . Heart failure Mother   . Colon cancer Brother     Social History:  reports that she has quit smoking. she has never used smokeless tobacco. She reports that she drinks about 1.8 oz of alcohol per week. She reports that she does not use drugs.  Allergies:  Allergies  Allergen Reactions  . Codeine Nausea Only  . Penicillins     Makes patient not feel well all over    Prior to Admission medications   Medication Sig Start Date End Date Taking? Authorizing Provider  acetaminophen (TYLENOL) 500 MG tablet Take 1,000 mg by mouth as needed for pain.    Yes [provider]  atorvastatin (LIPITOR) 20 MG tablet Take 20 mg by mouth daily at 6 PM. 11/19/13  Yes [provider]  calcium carbonate (TUMS EX) 750 MG chewable tablet Chew 2 tablets by mouth as needed for heartburn.   Yes [provider]  calcium-vitamin D (OSCAL WITH  D) 500-200 MG-UNIT per tablet Take 1 tablet by mouth 2 (two) times daily.   Yes [provider]  cholecalciferol (VITAMIN D) 1000 UNITS tablet Take 1,000 Units by mouth daily at 6 PM.    Yes [provider]  docusate sodium (COLACE) 100 MG capsule Take 100 mg by mouth as needed for mild constipation.   Yes [provider]  fexofenadine (ALLEGRA) 180 MG tablet Take 180 mg by mouth as needed for allergies or rhinitis.   Yes [provider]  LORazepam (ATIVAN) 0.5 MG tablet Take 0.5 mg by mouth as needed for anxiety.   Yes [provider]  magnesium gluconate (MAGONATE) 500 MG tablet Take 500 mg by mouth 2 (two) times daily.   Yes [provider]  NONFORMULARY OR COMPOUNDED ITEM Apply 1-2 g topically 3 (three) times daily.  06/06/17  Yes [provider]  omeprazole (PRILOSEC) 40 MG capsule Take 40 mg by mouth every morning.  12/08/12  Yes [provider]  Polyethyl Glycol-Propyl Glycol (SYSTANE OP) Place 1 drop into both eyes daily as needed (for dry eyes).   Yes [provider]  Probiotic Product (PROBIOTIC DAILY PO) Take 1 tablet by mouth every morning.   Yes [provider]  sodium  chloride (OCEAN) 0.65 % SOLN nasal spray Place 1 spray into both nostrils daily at 6 PM.   Yes [provider]  triamcinolone (NASACORT ALLERGY 24HR) 55 MCG/ACT AERO nasal inhaler Place 2 sprays into both nostrils 2 (two) times daily as needed (congestion).    Yes [provider]  triamterene-hydrochlorothiazide (MAXZIDE) 75-50 MG per tablet Take 1 tablet by mouth daily.     Yes [provider]  verapamil (CALAN-SR) 240 MG CR tablet Take 240 mg by mouth daily.    Yes [provider]  FLUZONE HIGH-DOSE 0.5 ML SUSY inject 0.5 milliliter intramuscularly 03/22/15   [provider]    Medications:  I have reviewed the patient's current medications. Scheduled: . atorvastatin  20 mg Oral q1800   . calcium-vitamin D  1 tablet Oral BID WC  . cholecalciferol  1,000 Units Oral q1800  . HYDROcodone-acetaminophen  1-2 tablet Oral Q8H  . levETIRAcetam  500 mg Oral BID  . pantoprazole  40 mg Oral Daily  . potassium chloride  40 mEq Oral Daily  . triamterene-hydrochlorothiazide  1 tablet Oral Daily  . verapamil  240 mg Oral Daily   Continuous: . sodium chloride 1,000 mL (06/08/17 1917)    Ct Head Wo Contrast  Result Date: 06/09/2017 CLINICAL DATA:  Subdural hematoma.  Facial fractures. EXAM: CT HEAD WITHOUT CONTRAST TECHNIQUE: Contiguous axial images were obtained from the base of the skull through the vertex without intravenous contrast. COMPARISON:  CT of the head and face 06/08/2017. FINDINGS: Brain: Left frontal subdural and subarachnoid hemorrhage is again noted. Hypoattenuating cortex compatible with contusion is more prominent on today's study. The frontal extra-axial hemorrhages not significantly changed. Blood products are again noted along the falx both anteriorly and posteriorly. Bladder is layering along the tentorium, asymmetric on the left. Subarachnoid hemorrhage about the cerebellum has increased. Intraventricular hemorrhage is more prominent on today's study with blood layering in the posterior horns of both lateral ventricles. Minimal blood is seen along the inferior fourth ventricle. There is no hydrocephalus. The brainstem is unremarkable. 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 Vascular: Vascular calcifications are again seen. There is no hyperdense vessel. Skull: Right occipital skull fracture is stable. Left zygomatic arch and temporal bone fractures are stable. Skullbase fractures cross in the left sphenoid wing and sphenoid sinuses are again noted with hemorrhage in the sphenoid sinus bilaterally. Left medial orbital wall fractures are present. No new fractures are present. Sinuses/Orbits: Blood layers in the sphenoid sinuses bilaterally. There is some  fluid in posterior left ethmoid air cells, likely also blood. Maxillary sinuses and frontal sinuses are clear. The mastoid air cells are clear. Bilateral lens replacements are present. Globes and orbits are otherwise within normal limits. Of note, the left sphenoid fracture crosses the orbital apex. IMPRESSION: 1. Similar appearance of subdural and subarachnoid hemorrhage anteriorly about the left frontal lobe. 2. Diffuse subdural blood layers along the falx and left greater than right tentorium. 3. Intraventricular hemorrhage is more prominent on today's study without hydrocephalus. 4. Subarachnoid hemorrhage is now evident about the cerebellum. 5. Progressive edema associated with left frontal contusions. 6. Multiple skull and facial fractures again demonstrated. Left sphenoid wing fracture crosses the orbital apex in could involve the left optic nerve. Electronically Signed   By: San Morelle M.D.   On: 06/09/2017 07:30   Ct Head Wo Contrast  Result Date: 06/08/2017 CLINICAL DATA:  Found down. Altered mental status. Large laceration on the back of the head  and hematoma over the left temple. EXAM: CT HEAD WITHOUT CONTRAST CT CERVICAL SPINE WITHOUT CONTRAST TECHNIQUE: Multidetector CT imaging of the head and cervical spine was performed following the standard protocol without intravenous contrast. Multiplanar CT image reconstructions of the cervical spine were also generated. COMPARISON:  MRI and CT head dated September 03, 2013. FINDINGS: CT HEAD FINDINGS Brain: There is an acute left cerebral convexity subdural hematoma, measuring up to a maximal thickness of 15 mm along the left frontal lobe. There is mass effect on the adjacent left frontal gyri. There is no midline shift or herniation. There is a small amount of subarachnoid blood in the left frontal sulci. Stable cerebral atrophy and chronic microvascular ischemic white matter disease. Vascular: Atherosclerotic vascular calcification of the carotid  siphons. No hyperdense vessel. Skull: There is an acute, nondisplaced fracture of the left parietal bone, extending into the greater wing of the sphenoid. There is a nondisplaced fracture of the right side of the occipital bone. Sinuses/Orbits: Layering blood within the bilateral sphenoid sinuses and left ethmoid air cells. The mastoid air cells are clear. Small foci of air within the right infratemporal fossa. Other: Small scalp hematomas over the right occipital bone and left frontal/parietal bones. CT CERVICAL SPINE FINDINGS Alignment: Focal reversal of the normal cervical lordosis at C4. Trace anterolisthesis at C3-C4 and C7-T1. Trace stepwise retrolisthesis at C4-C5 and C5-C6. No traumatic malalignment. Skull base and vertebrae: No acute fracture. No primary bone lesion or focal pathologic process. Soft tissues and spinal canal: No prevertebral fluid or swelling. No visible canal hematoma. Disc levels: Multilevel degenerative changes of the cervical spine, moderate at C4-C5 and C5-C6. At least moderate central spinal canal stenosis at C4-C5 due to posterior disc osteophyte complex. Scattered moderate facet arthropathy, greater on the left. Moderate to severe uncovertebral hypertrophy at C4-C5 and C5-C6 resulting in mild neuroforaminal stenosis on the left at C4-C5 and moderate neuroforaminal stenosis on the right at C5-C6. Upper chest: Negative. Other: There is a 1.1 cm low-density nodule in the left thyroid lobe. IMPRESSION: 1. Acute left cerebral convexity subdural hematoma, measuring up to 15 mm along the left frontal lobe. Adjacent mass effect on the left frontal gyri, without midline shift or herniation. 2. Small amount of subarachnoid hemorrhage in the left frontal sulci. 3. Acute, nondisplaced fracture of the left parietal bone, extending into the greater wing of the sphenoid. 4. Nondisplaced fracture through the right aspect of the occipital bone. 5. Layering blood within the bilateral sphenoid sinuses  and left ethmoid air cells, with small amount of air in the right infratemporal fossa. Findings are concerning for additional facial fractures, and maxillofacial CT is recommended for further evaluation. 6. No acute cervical spine fracture. Degenerative changes, moderate at C4-C5 and C5-C6. Critical Value/emergent results were called by telephone at the time of interpretation on 06/08/2017 at 8:59 am to Dr. Pattricia Boss , who verbally acknowledged these results. Electronically Signed   By: Titus Dubin M.D.   On: 06/08/2017 08:59   Ct Cervical Spine Wo Contrast  Result Date: 06/08/2017 CLINICAL DATA:  Found down. Altered mental status. Large laceration on the back of the head and hematoma over the left temple. EXAM: CT HEAD WITHOUT CONTRAST CT CERVICAL SPINE WITHOUT CONTRAST TECHNIQUE: Multidetector CT imaging of the head and cervical spine was performed following the standard protocol without intravenous contrast. Multiplanar CT image reconstructions of the cervical spine were also generated. COMPARISON:  MRI and CT head dated September 03, 2013. FINDINGS: CT HEAD FINDINGS  Brain: There is an acute left cerebral convexity subdural hematoma, measuring up to a maximal thickness of 15 mm along the left frontal lobe. There is mass effect on the adjacent left frontal gyri. There is no midline shift or herniation. There is a small amount of subarachnoid blood in the left frontal sulci. Stable cerebral atrophy and chronic microvascular ischemic white matter disease. Vascular: Atherosclerotic vascular calcification of the carotid siphons. No hyperdense vessel. Skull: There is an acute, nondisplaced fracture of the left parietal bone, extending into the greater wing of the sphenoid. There is a nondisplaced fracture of the right side of the occipital bone. Sinuses/Orbits: Layering blood within the bilateral sphenoid sinuses and left ethmoid air cells. The mastoid air cells are clear. Small foci of air within the right  infratemporal fossa. Other: Small scalp hematomas over the right occipital bone and left frontal/parietal bones. CT CERVICAL SPINE FINDINGS Alignment: Focal reversal of the normal cervical lordosis at C4. Trace anterolisthesis at C3-C4 and C7-T1. Trace stepwise retrolisthesis at C4-C5 and C5-C6. No traumatic malalignment. Skull base and vertebrae: No acute fracture. No primary bone lesion or focal pathologic process. Soft tissues and spinal canal: No prevertebral fluid or swelling. No visible canal hematoma. Disc levels: Multilevel degenerative changes of the cervical spine, moderate at C4-C5 and C5-C6. At least moderate central spinal canal stenosis at C4-C5 due to posterior disc osteophyte complex. Scattered moderate facet arthropathy, greater on the left. Moderate to severe uncovertebral hypertrophy at C4-C5 and C5-C6 resulting in mild neuroforaminal stenosis on the left at C4-C5 and moderate neuroforaminal stenosis on the right at C5-C6. Upper chest: Negative. Other: There is a 1.1 cm low-density nodule in the left thyroid lobe. IMPRESSION: 1. Acute left cerebral convexity subdural hematoma, measuring up to 15 mm along the left frontal lobe. Adjacent mass effect on the left frontal gyri, without midline shift or herniation. 2. Small amount of subarachnoid hemorrhage in the left frontal sulci. 3. Acute, nondisplaced fracture of the left parietal bone, extending into the greater wing of the sphenoid. 4. Nondisplaced fracture through the right aspect of the occipital bone. 5. Layering blood within the bilateral sphenoid sinuses and left ethmoid air cells, with small amount of air in the right infratemporal fossa. Findings are concerning for additional facial fractures, and maxillofacial CT is recommended for further evaluation. 6. No acute cervical spine fracture. Degenerative changes, moderate at C4-C5 and C5-C6. Critical Value/emergent results were called by telephone at the time of interpretation on 06/08/2017  at 8:59 am to Dr. Pattricia Boss , who verbally acknowledged these results. Electronically Signed   By: Titus Dubin M.D.   On: 06/08/2017 08:59   Dg Chest Port 1 View  Result Date: 06/08/2017 CLINICAL DATA:  Altered mental status.  Hypoxia. EXAM: PORTABLE CHEST 1 VIEW COMPARISON:  11/23/2013 FINDINGS: Numerous leads and wires project over the chest. Midline trachea. Normal heart size. Atherosclerosis in the transverse aorta. Tortuous thoracic aorta. No pleural effusion or pneumothorax. Clear lungs. IMPRESSION: No acute cardiopulmonary disease. Aortic Atherosclerosis (ICD10-I70.0). Electronically Signed   By: Abigail Miyamoto M.D.   On: 06/08/2017 08:29   Ct Maxillofacial Wo Contrast  Result Date: 06/08/2017 CLINICAL DATA:  Patient found down. Acute subdural hematoma on CT head. Evaluate for facial fracture. EXAM: CT MAXILLOFACIAL WITHOUT CONTRAST TECHNIQUE: Multidetector CT imaging of the maxillofacial structures was performed. Multiplanar CT image reconstructions were also generated. COMPARISON:  CT head and cervical spine from same day. FINDINGS: Osseous: There is a nondisplaced fracture through the left zygomatic  arch. Probable nondisplaced fracture of the left lamina papyracea, with a few small foci of air noted along the medial left orbital wall. Probable nondisplaced fracture of the right zygoma along the lateral wall of the right orbit, with a few small foci of air noted posteriorly in the infratemporal fossa. The left parietal bone fracture extending into the greater wing of the left sphenoid bone is again seen. 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 No mandible fracture. Orbits: Negative. No traumatic or inflammatory finding. Sinuses: Layering blood products in the bilateral sphenoid sinuses and left ethmoid air cells. Remaining paranasal sinuses and mastoid air cells are clear. Soft tissues: Negative. Limited intracranial: Partially visualized left cerebral convexity subdural hematoma,  better evaluated on CT head from same day. IMPRESSION: 1. Probable nondisplaced fractures of the left lamina papyracea and right zygoma along the lateral wall of the right orbit. 2. Nondisplaced fracture of the left zygomatic arch. 3. Unchanged layering hemorrhage in the bilateral sphenoid sinuses and left ethmoid air cells. Electronically Signed   By: Titus Dubin M.D.   On: 06/08/2017 10:19   Review of Systems  Unable to perform ROS: Mental status change   Blood pressure (!) 145/58, pulse 81, temperature 98.2 F (36.8 C), temperature source Axillary, resp. rate 16, height 5' (1.524 m), weight 56.7 kg (125 lb), last menstrual period 06/11/1976, SpO2 97 %. Physical Exam  Constitutional: She appears well-developed and well-nourished. Slightly confused. Head: Normocephalic. No laceration. Eyes: Conjunctivae and EOM are normal. Pupils are equal, round, and reactive to light.  Ears: Normal auricles and EACs. Nose: Normal mucosa, septum, and turbinates. Mouth: No mucosal lesion or injury. Cardiovascular: Normal rate and regular rhythm.  Pulmonary/Chest: Effort normal and breath sounds normal.  Neurological: She is alert. Oriented to person, not place or time  Skin: Capillary refill takes less than 2 seconds.  Nursing note and vitals reviewed.  Assessment/Plan: Nondisplaced fractures of bilateral zygoma, left lamina papyracea and right lateral orbital wall. She is asymptomatic. The fractures will not require surgical interventions. She may follow up with me as needed.  Darlyn Repsher W Anitta Tenny 06/09/2017, 12:05 PM

## 2017-06-09 NOTE — Progress Notes (Addendum)
OT Cancellation Note  Patient Details Name: Lori Boone MRN: [id number] DOB: 1926-04-10   Cancelled Treatment:    Reason Eval/Treat Not Completed: Patient not medically ready Pt with orders for strict bedrest. Please update activity order when appropriate for OT to proceed with eval. Thank you.    [place] 06/09/2017, 8:30 AM  Hulda Humphrey OTR/L [phone]   OT checked back again at 2:15, and strict bed rest still in place. OT will continue to follow for updated activity orders.   Hulda Humphrey OTR/L [phone]

## 2017-06-09 NOTE — Progress Notes (Signed)
Patient ID: [name], female   DOB: 1925-10-19, 83 y.o.   MRN: [id number] Subjective: The patient is somnolent but easily arousable.  She has got some Vicodin.  Her daughter is at the bedside.  Objective: Vital signs in last 24 hours: Temp:  [97.9 F (36.6 C)-99.6 F (37.6 C)] 98.5 F (36.9 C) (12/30 0815) Pulse Rate:  [72-81] 81 (12/30 0253) Resp:  [12-23] 16 (12/30 0253) BP: (114-148)/(41-89) 131/89 (12/29 2000) SpO2:  [93 %-99 %] 97 % (12/30 0253) Weight:  [56.7 kg (125 lb)-59 kg (130 lb)] 56.7 kg (125 lb) (12/29 1753)  Intake/Output from previous day: 12/29 0701 - 12/30 0700 In: 5361 [I.V.:390; IV Piggyback:1315] Out: 800 [Urine:800] Intake/Output this shift: No intake/output data recorded.  Physical exam the patient is somnolent but easily arousable.  She is oriented to person.  She moves all 4 extremities well.  I have reviewed the patient's follow-up head CT.  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  She has a left frontal subdural hematoma and intraventricular hemorrhage without significant change from her CAT scan yesterday.  There is no significant mass-effect.  Lab Results: Recent Labs    06/08/17 0741 06/09/17 0511  WBC 15.2* 16.4*  HGB 13.1 11.7*  HCT 38.2 36.5  PLT 267 215   BMET Recent Labs    06/08/17 0741 06/09/17 0511  NA 136 136  K 3.1* 3.2*  CL 102 103  CO2 24 23  GLUCOSE 185* 124*  BUN 24* 23*  CREATININE 0.81 0.72  CALCIUM 9.1 7.9*    Studies/Results: Ct Head Wo Contrast  Result Date: 06/09/2017 CLINICAL DATA:  Subdural hematoma.  Facial fractures. EXAM: CT HEAD WITHOUT CONTRAST TECHNIQUE: Contiguous axial images were obtained from the base of the skull through the vertex without intravenous contrast. COMPARISON:  CT of the head and face 06/08/2017. FINDINGS: Brain: Left frontal subdural and subarachnoid hemorrhage is again noted. Hypoattenuating cortex compatible with contusion is more prominent on today's study. The  frontal extra-axial hemorrhages not significantly changed. Blood products are again noted along the falx both anteriorly and posteriorly. Bladder is layering along the tentorium, asymmetric on the left. Subarachnoid hemorrhage about the cerebellum has increased. Intraventricular hemorrhage is more prominent on today's study with blood layering in the posterior horns of both lateral ventricles. Minimal blood is seen along the inferior fourth ventricle. There is no hydrocephalus. The brainstem is unremarkable. 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 Vascular: Vascular calcifications are again seen. There is no hyperdense vessel. Skull: Right occipital skull fracture is stable. Left zygomatic arch and temporal bone fractures are stable. Skullbase fractures cross in the left sphenoid wing and sphenoid sinuses are again noted with hemorrhage in the sphenoid sinus bilaterally. Left medial orbital wall fractures are present. No new fractures are present. Sinuses/Orbits: Blood layers in the sphenoid sinuses bilaterally. There is some fluid in posterior left ethmoid air cells, likely also blood. Maxillary sinuses and frontal sinuses are clear. The mastoid air cells are clear. Bilateral lens replacements are present. Globes and orbits are otherwise within normal limits. Of note, the left sphenoid fracture crosses the orbital apex. IMPRESSION: 1. Similar appearance of subdural and subarachnoid hemorrhage anteriorly about the left frontal lobe. 2. Diffuse subdural blood layers along the falx and left greater than right tentorium. 3. Intraventricular hemorrhage is more prominent on today's study without hydrocephalus. 4. Subarachnoid hemorrhage is now evident about the cerebellum. 5. Progressive edema associated with left frontal contusions. 6. Multiple skull and facial fractures  again demonstrated. Left sphenoid wing fracture crosses the orbital apex in could involve the left optic nerve. Electronically  Signed   By: San Morelle M.D.   On: 06/09/2017 07:30   Ct Head Wo Contrast  Result Date: 06/08/2017 CLINICAL DATA:  Found down. Altered mental status. Large laceration on the back of the head and hematoma over the left temple. EXAM: CT HEAD WITHOUT CONTRAST CT CERVICAL SPINE WITHOUT CONTRAST TECHNIQUE: Multidetector CT imaging of the head and cervical spine was performed following the standard protocol without intravenous contrast. Multiplanar CT image reconstructions of the cervical spine were also generated. COMPARISON:  MRI and CT head dated September 03, 2013. FINDINGS: CT HEAD FINDINGS Brain: There is an acute left cerebral convexity subdural hematoma, measuring up to a maximal thickness of 15 mm along the left frontal lobe. There is mass effect on the adjacent left frontal gyri. There is no midline shift or herniation. There is a small amount of subarachnoid blood in the left frontal sulci. Stable cerebral atrophy and chronic microvascular ischemic white matter disease. Vascular: Atherosclerotic vascular calcification of the carotid siphons. No hyperdense vessel. Skull: There is an acute, nondisplaced fracture of the left parietal bone, extending into the greater wing of the sphenoid. There is a nondisplaced fracture of the right side of the occipital bone. Sinuses/Orbits: Layering blood within the bilateral sphenoid sinuses and left ethmoid air cells. The mastoid air cells are clear. Small foci of air within the right infratemporal fossa. Other: Small scalp hematomas over the right occipital bone and left frontal/parietal bones. CT CERVICAL SPINE FINDINGS Alignment: Focal reversal of the normal cervical lordosis at C4. Trace anterolisthesis at C3-C4 and C7-T1. Trace stepwise retrolisthesis at C4-C5 and C5-C6. No traumatic malalignment. Skull base and vertebrae: No acute fracture. No primary bone lesion or focal pathologic process. Soft tissues and spinal canal: No prevertebral fluid or swelling. No  visible canal hematoma. Disc levels: Multilevel degenerative changes of the cervical spine, moderate at C4-C5 and C5-C6. At least moderate central spinal canal stenosis at C4-C5 due to posterior disc osteophyte complex. Scattered moderate facet arthropathy, greater on the left. Moderate to severe uncovertebral hypertrophy at C4-C5 and C5-C6 resulting in mild neuroforaminal stenosis on the left at C4-C5 and moderate neuroforaminal stenosis on the right at C5-C6. Upper chest: Negative. Other: There is a 1.1 cm low-density nodule in the left thyroid lobe. IMPRESSION: 1. Acute left cerebral convexity subdural hematoma, measuring up to 15 mm along the left frontal lobe. Adjacent mass effect on the left frontal gyri, without midline shift or herniation. 2. Small amount of subarachnoid hemorrhage in the left frontal sulci. 3. Acute, nondisplaced fracture of the left parietal bone, extending into the greater wing of the sphenoid. 4. Nondisplaced fracture through the right aspect of the occipital bone. 5. Layering blood within the bilateral sphenoid sinuses and left ethmoid air cells, with small amount of air in the right infratemporal fossa. Findings are concerning for additional facial fractures, and maxillofacial CT is recommended for further evaluation. 6. No acute cervical spine fracture. Degenerative changes, moderate at C4-C5 and C5-C6. Critical Value/emergent results were called by telephone at the time of interpretation on 06/08/2017 at 8:59 am to Dr. Pattricia Boss , who verbally acknowledged these results. Electronically Signed   By: Titus Dubin M.D.   On: 06/08/2017 08:59   Ct Cervical Spine Wo Contrast  Result Date: 06/08/2017 CLINICAL DATA:  Found down. Altered mental status. Large laceration on the back of the head and hematoma over  the left temple. EXAM: CT HEAD WITHOUT CONTRAST CT CERVICAL SPINE WITHOUT CONTRAST TECHNIQUE: Multidetector CT imaging of the head and cervical spine was performed following  the standard protocol without intravenous contrast. Multiplanar CT image reconstructions of the cervical spine were also generated. COMPARISON:  MRI and CT head dated September 03, 2013. FINDINGS: CT HEAD FINDINGS Brain: There is an acute left cerebral convexity subdural hematoma, measuring up to a maximal thickness of 15 mm along the left frontal lobe. There is mass effect on the adjacent left frontal gyri. There is no midline shift or herniation. There is a small amount of subarachnoid blood in the left frontal sulci. Stable cerebral atrophy and chronic microvascular ischemic white matter disease. Vascular: Atherosclerotic vascular calcification of the carotid siphons. No hyperdense vessel. Skull: There is an acute, nondisplaced fracture of the left parietal bone, extending into the greater wing of the sphenoid. There is a nondisplaced fracture of the right side of the occipital bone. Sinuses/Orbits: Layering blood within the bilateral sphenoid sinuses and left ethmoid air cells. The mastoid air cells are clear. Small foci of air within the right infratemporal fossa. Other: Small scalp hematomas over the right occipital bone and left frontal/parietal bones. CT CERVICAL SPINE FINDINGS Alignment: Focal reversal of the normal cervical lordosis at C4. Trace anterolisthesis at C3-C4 and C7-T1. Trace stepwise retrolisthesis at C4-C5 and C5-C6. No traumatic malalignment. Skull base and vertebrae: No acute fracture. No primary bone lesion or focal pathologic process. Soft tissues and spinal canal: No prevertebral fluid or swelling. No visible canal hematoma. Disc levels: Multilevel degenerative changes of the cervical spine, moderate at C4-C5 and C5-C6. At least moderate central spinal canal stenosis at C4-C5 due to posterior disc osteophyte complex. Scattered moderate facet arthropathy, greater on the left. Moderate to severe uncovertebral hypertrophy at C4-C5 and C5-C6 resulting in mild neuroforaminal stenosis on the left  at C4-C5 and moderate neuroforaminal stenosis on the right at C5-C6. Upper chest: Negative. Other: There is a 1.1 cm low-density nodule in the left thyroid lobe. IMPRESSION: 1. Acute left cerebral convexity subdural hematoma, measuring up to 15 mm along the left frontal lobe. Adjacent mass effect on the left frontal gyri, without midline shift or herniation. 2. Small amount of subarachnoid hemorrhage in the left frontal sulci. 3. Acute, nondisplaced fracture of the left parietal bone, extending into the greater wing of the sphenoid. 4. Nondisplaced fracture through the right aspect of the occipital bone. 5. Layering blood within the bilateral sphenoid sinuses and left ethmoid air cells, with small amount of air in the right infratemporal fossa. Findings are concerning for additional facial fractures, and maxillofacial CT is recommended for further evaluation. 6. No acute cervical spine fracture. Degenerative changes, moderate at C4-C5 and C5-C6. Critical Value/emergent results were called by telephone at the time of interpretation on 06/08/2017 at 8:59 am to Dr. Pattricia Boss , who verbally acknowledged these results. Electronically Signed   By: Titus Dubin M.D.   On: 06/08/2017 08:59   Dg Chest Port 1 View  Result Date: 06/08/2017 CLINICAL DATA:  Altered mental status.  Hypoxia. EXAM: PORTABLE CHEST 1 VIEW COMPARISON:  11/23/2013 FINDINGS: Numerous leads and wires project over the chest. Midline trachea. Normal heart size. Atherosclerosis in the transverse aorta. Tortuous thoracic aorta. No pleural effusion or pneumothorax. Clear lungs. IMPRESSION: No acute cardiopulmonary disease. Aortic Atherosclerosis (ICD10-I70.0). Electronically Signed   By: Abigail Miyamoto M.D.   On: 06/08/2017 08:29   Ct Maxillofacial Wo Contrast  Result Date: 06/08/2017 CLINICAL DATA:  Patient found down. Acute subdural hematoma on CT head. Evaluate for facial fracture. EXAM: CT MAXILLOFACIAL WITHOUT CONTRAST TECHNIQUE:  Multidetector CT imaging of the maxillofacial structures was performed. Multiplanar CT image reconstructions were also generated. COMPARISON:  CT head and cervical spine from same day. FINDINGS: Osseous: There is a nondisplaced fracture through the left zygomatic arch. Probable nondisplaced fracture of the left lamina papyracea, with a few small foci of air noted along the medial left orbital wall. Probable nondisplaced fracture of the right zygoma along the lateral wall of the right orbit, with a few small foci of air noted posteriorly in the infratemporal fossa. The left parietal bone fracture extending into the greater wing of the left sphenoid bone is again seen. 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 No mandible fracture. Orbits: Negative. No traumatic or inflammatory finding. Sinuses: Layering blood products in the bilateral sphenoid sinuses and left ethmoid air cells. Remaining paranasal sinuses and mastoid air cells are clear. Soft tissues: Negative. Limited intracranial: Partially visualized left cerebral convexity subdural hematoma, better evaluated on CT head from same day. IMPRESSION: 1. Probable nondisplaced fractures of the left lamina papyracea and right zygoma along the lateral wall of the right orbit. 2. Nondisplaced fracture of the left zygomatic arch. 3. Unchanged layering hemorrhage in the bilateral sphenoid sinuses and left ethmoid air cells. Electronically Signed   By: Titus Dubin M.D.   On: 06/08/2017 10:19    Assessment/Plan: Subdural hematoma, intraventricular hemorrhage: The patient is stable clinically good I spoke with the patient's daughter.  She is not interested in aggressive measures such as surgery so I do not see any point in repeating her CAT scan.  I have answered all her questions.  I would suggest physical therapy consult to mobilize her.  Depending on how she responds she may need skilled nursing facility placement.  Please call if we can be of further  assistance.  LOS: 1 day     Lori Boone 06/09/2017, 10:09 AM

## 2017-06-09 NOTE — Progress Notes (Addendum)
PT Cancellation Note  Patient Details Name: Lori Boone MRN: [id number] DOB: 1925/09/25   Cancelled Treatment:    Reason Eval/Treat Not Completed: Medical issues which prohibited therapy. Pt with orders for strict bedrest. Please update activity order when appropriate for PT to proceed with eval. Thank you.  Addendum 1309: Strict bedrest order remains in place.    Lorriane Shire 06/09/2017, 8:03 AM  Lorrin Goodell, PT  Office # [phone] Pager [phone]

## 2017-06-10 ENCOUNTER — Inpatient Hospital Stay (HOSPITAL_COMMUNITY): Payer: Medicare Other

## 2017-06-10 DIAGNOSIS — S0292XD Unspecified fracture of facial bones, subsequent encounter for fracture with routine healing: Secondary | ICD-10-CM

## 2017-06-10 DIAGNOSIS — F0390 Unspecified dementia without behavioral disturbance: Secondary | ICD-10-CM

## 2017-06-10 DIAGNOSIS — S0292XA Unspecified fracture of facial bones, initial encounter for closed fracture: Secondary | ICD-10-CM

## 2017-06-10 DIAGNOSIS — I609 Nontraumatic subarachnoid hemorrhage, unspecified: Secondary | ICD-10-CM

## 2017-06-10 LAB — CBC WITH DIFFERENTIAL/PLATELET
Basophils Absolute: 0 10*3/uL (ref 0.0–0.1)
Basophils Relative: 0 %
EOS PCT: 0 %
Eosinophils Absolute: 0 10*3/uL (ref 0.0–0.7)
HCT: 37.7 % (ref 36.0–46.0)
Hemoglobin: 12.4 g/dL (ref 12.0–15.0)
LYMPHS ABS: 1.5 10*3/uL (ref 0.7–4.0)
LYMPHS PCT: 9 %
MCH: 31.4 pg (ref 26.0–34.0)
MCHC: 32.9 g/dL (ref 30.0–36.0)
MCV: 95.4 fL (ref 78.0–100.0)
MONO ABS: 1.1 10*3/uL — AB (ref 0.1–1.0)
Monocytes Relative: 7 %
Neutro Abs: 13.9 10*3/uL — ABNORMAL HIGH (ref 1.7–7.7)
Neutrophils Relative %: 84 %
PLATELETS: 212 10*3/uL (ref 150–400)
RBC: 3.95 MIL/uL (ref 3.87–5.11)
RDW: 13.8 % (ref 11.5–15.5)
WBC: 16.5 10*3/uL — ABNORMAL HIGH (ref 4.0–10.5)

## 2017-06-10 LAB — COMPREHENSIVE METABOLIC PANEL
ALBUMIN: 2.7 g/dL — AB (ref 3.5–5.0)
ALT: 16 U/L (ref 14–54)
AST: 22 U/L (ref 15–41)
Alkaline Phosphatase: 87 U/L (ref 38–126)
Anion gap: 9 (ref 5–15)
BILIRUBIN TOTAL: 0.7 mg/dL (ref 0.3–1.2)
BUN: 23 mg/dL — AB (ref 6–20)
CHLORIDE: 108 mmol/L (ref 101–111)
CO2: 24 mmol/L (ref 22–32)
Calcium: 8.7 mg/dL — ABNORMAL LOW (ref 8.9–10.3)
Creatinine, Ser: 0.77 mg/dL (ref 0.44–1.00)
GFR calc Af Amer: >60 mL/min (ref >60)
GFR calc non Af Amer: >60 mL/min (ref >60)
GLUCOSE: 131 mg/dL — AB (ref 65–99)
POTASSIUM: 3.7 mmol/L (ref 3.5–5.1)
Sodium: 141 mmol/L (ref 135–145)
Total Protein: 5.5 g/dL — ABNORMAL LOW (ref 6.5–8.1)

## 2017-06-10 LAB — GLUCOSE, CAPILLARY: Glucose-Capillary: 129 mg/dL — ABNORMAL HIGH (ref 65–99)

## 2017-06-10 MED ORDER — BACITRACIN-NEOMYCIN-POLYMYXIN OINTMENT TUBE
TOPICAL_OINTMENT | Freq: Two times a day (BID) | CUTANEOUS | Status: DC
  Administered 2017-06-10 – 2017-06-11 (×3): via TOPICAL
  Administered 2017-06-11: 1 via TOPICAL
  Administered 2017-06-12 – 2017-06-13 (×3): via TOPICAL
  Administered 2017-06-13: 1 via TOPICAL
  Filled 2017-06-10 (×2): qty 14.17

## 2017-06-10 NOTE — Progress Notes (Signed)
Patient tolerated sitting in bedside chair without difficulty.  Daughter with lots of questions about the care of her mother.  Patient given a hair washing and cleansing to multiple[le wounds to the back of her neck.  Ointment applied to wounds.  patietn encouraged to wear neck brace until further orders from Dr.

## 2017-06-10 NOTE — Progress Notes (Signed)
Bladder scan performed 409 mls of urine present in bladder. 14Fr. 10 ml balloon catheter placed.No return urinenoted in tubing.  Will monitor.

## 2017-06-10 NOTE — Care Management Note (Signed)
Case Management Note  Patient Details  Name: Lori Boone MRN: [id number] Date of Birth: 01-02-1926  Subjective/Objective:    Pt admitted with subdural hematoma and intraventricular hemorhage                Action/Plan:  PTA semi independent from home.  Per daughter;  recently pt has required assistance with ADL's - daughter has been spending partial weeks with mom to help out due to decreased mobility.  CM spoke in detail with daughter regarding the need for PT eval (pending) to help determine safe discharge which could include; SNF or HH - at this time daughter is not requesting private duty care information as she would like to wait for therapy recommendations.  CM will continue to follow for discharge needs   Expected Discharge Date:                  Expected Discharge Plan:  [employer]  In-House Referral:     Discharge planning Services  CM Consult  Post Acute Care Choice:    Choice offered to:     DME Arranged:    DME Agency:     HH Arranged:    HH Agency:     Status of Service:     If discussed at [employer], dates discussed:    Additional Comments:  Maryclare Labrador, RN 06/10/2017, 11:07 AM

## 2017-06-10 NOTE — Evaluation (Signed)
Physical Therapy Evaluation Patient Details Name: Lori Boone MRN: [id number] DOB: 1926/02/22 Today's Date: 06/10/2017   History of Present Illness  64 fem with HTN, simple ovarian cysts stable since 2017, prior near syncope 2013 with no cause, progressive dementia over last year, reflux.  Pt sustained probable mech fall resulting in SDH and Subarachnoid hemorrhage, fractures of face and parietal and occipital bones.   Clinical Impression  Pt admitted with above diagnosis. Pt currently with functional limitations due to the deficits listed below (see PT Problem List). Pt was able to  Stand and pivot to a chair with max assist of 2 persons.  Pt with poor cognition which daughter states has progressed over last year with dementia. Daughter reports that she really wants pt to be close to her in [place], [place] and she has checked with the [facility] in [place] and they have a bed for her mom.  She is ready for mother to incr care at a facility.  Will follow acutely.  Pt will benefit from skilled PT to increase their independence and safety with mobility to allow discharge to the venue listed below.      Follow Up Recommendations SNF;Supervision/Assistance - 24 hour    Equipment Recommendations  Other (comment)(TBA)    Recommendations for Other Services       Precautions / Restrictions Precautions Precautions: Fall;Cervical Required Braces or Orthoses: Cervical Brace Cervical Brace: Soft collar;At all times Restrictions Weight Bearing Restrictions: No      Mobility  Bed Mobility Overal bed mobility: Needs Assistance Bed Mobility: Rolling;Sidelying to Sit Rolling: Max assist;+2 for physical assistance Sidelying to sit: Max assist;+2 for physical assistance       General bed mobility comments: required max assist to come to EOB.   Transfers Overall transfer level: Needs assistance Equipment used: 2 person hand held assist Transfers: Sit to/from [facility] Sit to  Stand: Max assist;+2 physical assistance Stand pivot transfers: Max assist;+2 physical assistance       General transfer comment: Pt needed max assist to stand with  alot of assist given at buttocks to stand.  Pt foley leaked tehrefore assisted pt with cleaning and changing gown.  Pt with difficulty achieveing full upright stance with both LEs remaining bent somewhat and pt with bil knee instability needing close max assist for transfer with 2 persons needed.   Ambulation/Gait                Stairs            Wheelchair Mobility    Modified Rankin (Stroke Patients Only)       Balance Overall balance assessment: Needs assistance Sitting-balance support: No upper extremity supported;Feet supported Sitting balance-Leahy Scale: Zero Sitting balance - Comments: Needed max assist to stay sitting on EOB.  Pt short and feet had difficulty touching floor therefore she had trouble maintaining balance at EOB.      Standing balance support: Bilateral upper extremity supported;During functional activity Standing balance-Leahy Scale: Zero Standing balance comment: max assist to stand and could not stand fully upright                             Pertinent Vitals/Pain Pain Assessment: No/denies pain  BP 169/70 initially.  Down to 128/96 with activity.  Back to 157/70 at end of treatment.  Desat on RA to 84 therefore left pt on 5LO2 and pt stays >90%.    Home Living Family/patient  expects to be discharged to:: Private residence Living Arrangements: Alone Available Help at Discharge: Family;Available PRN/intermittently(daughter had been staying 50% of time to help) Type of Home: House Home Access: Level entry     Home Layout: One level Home Equipment: Cane - single point;Walker - 2 wheels      Prior Function Level of Independence: Independent;Needs assistance   Gait / Transfers Assistance Needed: ambulated without device  ADL's / Homemaking Assistance Needed:  daughter cooked, pt I with ADLs        Hand Dominance   Dominant Hand: Right    Extremity/Trunk Assessment   Upper Extremity Assessment Upper Extremity Assessment: Defer to OT evaluation    Lower Extremity Assessment Lower Extremity Assessment: Generalized weakness    Cervical / Trunk Assessment Cervical / Trunk Assessment: Kyphotic  Communication   Communication: No difficulties;Expressive difficulties;HOH  Cognition Arousal/Alertness: Awake/alert Behavior During Therapy: Flat affect Overall Cognitive Status: Impaired/Different from baseline Area of Impairment: Orientation;Memory;Following commands;Safety/judgement;Awareness;Problem solving                 Orientation Level: Disoriented to;Place;Time;Situation(if given options, could respond with correct option)   Memory: Decreased short-term memory;Decreased recall of precautions Following Commands: Follows one step commands inconsistently Safety/Judgement: Decreased awareness of safety;Decreased awareness of deficits   Problem Solving: Slow processing;Decreased initiation;Difficulty sequencing;Requires verbal cues;Requires tactile cues General Comments: daughter says pt has been on dementia med for over a year and it has worsened to the point she cannot stay alone any more.       General Comments      Exercises     Assessment/Plan    PT Assessment Patient needs continued PT services  PT Problem List Decreased activity tolerance;Decreased balance;Decreased mobility;Decreased knowledge of use of DME;Decreased coordination;Decreased cognition;Decreased safety awareness;Decreased knowledge of precautions       PT Treatment Interventions DME instruction;Gait training;Functional mobility training;Therapeutic activities;Therapeutic exercise;Balance training;Patient/family education;Wheelchair mobility training;Cognitive remediation;Neuromuscular re-education    PT Goals (Current goals can be found in the Care  Plan section)  Acute Rehab PT Goals Patient Stated Goal: unable to state PT Goal Formulation: With patient Time For Goal Achievement: 06/24/17 Potential to Achieve Goals: Good    Frequency Min 3X/week   Barriers to discharge Decreased caregiver support daughter lives in [place]    Co-evaluation PT/OT/SLP Co-Evaluation/Treatment: Yes Reason for Co-Treatment: Complexity of the patient's impairments (multi-system involvement) PT goals addressed during session: Mobility/safety with mobility         AM-PAC PT "6 Clicks" Daily Activity  Outcome Measure Difficulty turning over in bed (including adjusting bedclothes, sheets and blankets)?: Unable Difficulty moving from lying on back to sitting on the side of the bed? : Unable Difficulty sitting down on and standing up from a chair with arms (e.g., wheelchair, bedside commode, etc,.)?: Unable Help needed moving to and from a bed to chair (including a wheelchair)?: Total Help needed walking in hospital room?: Total Help needed climbing 3-5 steps with a railing? : Total 6 Click Score: 6    End of Session Equipment Utilized During Treatment: Gait belt;Oxygen Activity Tolerance: Patient limited by fatigue Patient left: in chair;with call bell/phone within reach;with chair alarm set;with family/visitor present Nurse Communication: Mobility status PT Visit Diagnosis: Unsteadiness on feet (R26.81);Muscle weakness (generalized) (M62.81)    Time: 2778-2423 PT Time Calculation (min) (ACUTE ONLY): 31 min   Charges:   PT Evaluation $PT Eval Moderate Complexity: 1 Mod     PT G Codes:        Berna Gitto,PT Acute  Rehabilitation [phone] (pager)   Denice Paradise 06/10/2017, 3:03 PM

## 2017-06-10 NOTE — Progress Notes (Signed)
PROGRESS NOTE    Lori Boone   QJJ:[id number]  DOB: 1925/07/30  DOA: 06/08/2017 PCP: Burnard Bunting, MD   Brief Narrative:  Lori Boone with HTN, syncope,mild dementia, GERD admitted for a fall and found to have a subdural and subarachnoid hemorrhage, multiple skull and facial fractures admitted to [facility] as the ER doctor thought she was comfort care.    Subjective: Alert but confused. Her daughter states that the patient has underlying dementia and is now is much more confused.      Assessment & Plan:   Active Problems:    SDH/  Subarachnoid bleed - conservative management- NS has signed off   Facial fractures - "Right occipital skull fracture is stable. Left zygomatic arch and temporal bone fractures are stable. Skullbase fractures cross in the left sphenoid wing and sphenoid sinuses are again noted with hemorrhage in the sphenoid sinus bilaterally. Left medial orbital wall fractures are present. " - per ENT, fracture should heal without intervention- he recommends f/u as needed  Acute encephalopathy - underlying dementia- new bleed may be affecting mentation    Hyperlipidemia - on Statin at home    GERD (gastroesophageal reflux disease) -   PPI    Hypertension - on Maxzide and Verapamil  DVT prophylaxis: SCDs Code Status: DNR Family Communication: daughter at bedside Disposition Plan: working with PT- follow Consultants:   NS  ENT Procedures:    Antimicrobials:  Anti-infectives (From admission, onward)   None       Objective: Vitals:   06/10/17 0543 06/10/17 0724 06/10/17 1006 06/10/17 1140  BP:  (!) 152/64 (!) 165/67 (!) 165/67  Pulse:  65  73  Resp:  (!) 24  (!) 27  Temp:  98.2 F (36.8 C)  (!) 97.5 F (36.4 C)  TempSrc:  Oral  Oral  SpO2:  93%  90%  Weight: 53.8 kg (118 lb 9.7 oz)     Height:        Intake/Output Summary (Last 24 hours) at 06/10/2017 1518 Last data filed at 06/10/2017 0600 Gross per 24 hour    Intake 850 ml  Output 300 ml  Net 550 ml   Filed Weights   06/08/17 1753 06/09/17 0350 06/10/17 0543  Weight: 56.7 kg (125 lb) 53.8 kg (118 lb 9.7 oz) 53.8 kg (118 lb 9.7 oz)    Examination: General exam: Appears comfortable  HEENT: PERRLA, oral mucosa moist, no sclera icterus or thrush Respiratory system: Clear to auscultation. Respiratory effort normal. Cardiovascular system: S1 & S2 heard, RRR.  No murmurs  Gastrointestinal system: Abdomen soft, non-tender, nondistended. Normal bowel sound. No organomegaly Central nervous system: Alert - confused- No focal neurological deficits. Extremities: No cyanosis, clubbing or edema Skin: No rashes or ulcers Psychiatry:  Mood & affect appropriate.    Data Reviewed: I have personally reviewed following labs and imaging studies  CBC: Recent Labs  Lab 06/08/17 0741 06/09/17 0511 06/10/17 0454  WBC 15.2* 16.4* 16.5*  NEUTROABS 13.8*  --  13.9*  HGB 13.1 11.7* 12.4  HCT 38.2 36.5 37.7  MCV 90.1 94.1 95.4  PLT 267 215 481   Basic Metabolic Panel: Recent Labs  Lab 06/08/17 0741 06/09/17 0511 06/10/17 0454  NA 136 136 141  K 3.1* 3.2* 3.7  CL 102 103 108  CO2 24 23 24   GLUCOSE 185* 124* 131*  BUN 24* 23* 23*  CREATININE 0.81 0.72 0.77  CALCIUM 9.1 7.9* 8.7*   GFR: CrCl cannot be calculated (Unknown  ideal weight.). Liver Function Tests: Recent Labs  Lab 06/08/17 0741 06/09/17 0511 06/10/17 0454  AST 35 24 22  ALT 22 18 16   ALKPHOS 108 83 87  BILITOT 0.9 1.0 0.7  PROT 6.0* 5.2* 5.5*  ALBUMIN 3.4* 2.7* 2.7*   No results for input(s): LIPASE, AMYLASE in the last 168 hours. No results for input(s): AMMONIA in the last 168 hours. Coagulation Profile: Recent Labs  Lab 06/09/17 0511  INR 1.13   Cardiac Enzymes: Recent Labs  Lab 06/08/17 0741  CKTOTAL 95   BNP (last 3 results) No results for input(s): PROBNP in the last 8760 hours. HbA1C: Recent Labs    06/08/17 1127  HGBA1C 5.7*   CBG: Recent Labs   Lab 06/09/17 0807 06/09/17 1842 06/10/17 0722  GLUCAP 115* 201* 129*   Lipid Profile: No results for input(s): CHOL, HDL, LDLCALC, TRIG, CHOLHDL, LDLDIRECT in the last 72 hours. Thyroid Function Tests: No results for input(s): TSH, T4TOTAL, FREET4, T3FREE, THYROIDAB in the last 72 hours. Anemia Panel: No results for input(s): VITAMINB12, FOLATE, FERRITIN, TIBC, IRON, RETICCTPCT in the last 72 hours. Urine analysis:    Component Value Date/Time   COLORURINE YELLOW 06/08/2017 0732   APPEARANCEUR CLEAR 06/08/2017 0732   LABSPEC 1.018 06/08/2017 0732   PHURINE 6.0 06/08/2017 0732   GLUCOSEU NEGATIVE 06/08/2017 0732   HGBUR NEGATIVE 06/08/2017 0732   BILIRUBINUR NEGATIVE 06/08/2017 0732   KETONESUR 5 (A) 06/08/2017 0732   PROTEINUR NEGATIVE 06/08/2017 0732   UROBILINOGEN 0.2 11/23/2013 2308   NITRITE NEGATIVE 06/08/2017 0732   LEUKOCYTESUR NEGATIVE 06/08/2017 0732   Sepsis Labs: @LABRCNTIP (procalcitonin:4,lacticidven:4) ) Recent Results (from the past 240 hour(s))  Blood culture (routine x 2)     Status: None (Preliminary result)   Collection Time: 06/08/17  7:46 AM  Result Value Ref Range Status   Specimen Description BLOOD RIGHT ANTECUBITAL  Final   Special Requests   Final    BOTTLES DRAWN AEROBIC AND ANAEROBIC Blood Culture adequate volume   Culture NO GROWTH 2 DAYS  Final   Report Status PENDING  Incomplete  Blood culture (routine x 2)     Status: None (Preliminary result)   Collection Time: 06/08/17  7:53 AM  Result Value Ref Range Status   Specimen Description BLOOD LEFT ANTECUBITAL  Final   Special Requests   Final    BOTTLES DRAWN AEROBIC AND ANAEROBIC Blood Culture adequate volume   Culture NO GROWTH 2 DAYS  Final   Report Status PENDING  Incomplete  MRSA PCR Screening     Status: None   Collection Time: 06/08/17  5:30 PM  Result Value Ref Range Status   MRSA by PCR NEGATIVE NEGATIVE Final    Comment:        The GeneXpert MRSA Assay (FDA approved for NASAL  specimens only), is one component of a comprehensive MRSA colonization surveillance program. It is not intended to diagnose MRSA infection nor to guide or monitor treatment for MRSA infections.          Radiology Studies: Dg Chest 2 View  Result Date: 06/10/2017 CLINICAL DATA:  83 year old female with weakness, dyspnea and hypoxia. EXAM: CHEST  2 VIEW COMPARISON:  06/08/2017 FINDINGS: The heart size and mediastinal contours are within normal limits. Interval development of vascular congestion and perihilar airspace opacities with small bilateral pleural effusions, left greater than right consistent with CHF. Moderate aortic atherosclerosis without aneurysmal dilatation. No acute osseous abnormality. IMPRESSION: Vascular congestion with perihilar airspace opacities and small bilateral pleural  effusions compatible with mild CHF. Superimposed pneumonia would be difficult to entirely exclude. Electronically Signed   By: Ashley Royalty M.D.   On: 06/10/2017 13:36   Ct Head Wo Contrast  Result Date: 06/09/2017 CLINICAL DATA:  Subdural hematoma.  Facial fractures. EXAM: CT HEAD WITHOUT CONTRAST TECHNIQUE: Contiguous axial images were obtained from the base of the skull through the vertex without intravenous contrast. COMPARISON:  CT of the head and face 06/08/2017. FINDINGS: Brain: Left frontal subdural and subarachnoid hemorrhage is again noted. Hypoattenuating cortex compatible with contusion is more prominent on today's study. The frontal extra-axial hemorrhages not significantly changed. Blood products are again noted along the falx both anteriorly and posteriorly. Bladder is layering along the tentorium, asymmetric on the left. Subarachnoid hemorrhage about the cerebellum has increased. Intraventricular hemorrhage is more prominent on today's study with blood layering in the posterior horns of both lateral ventricles. Minimal blood is seen along the inferior fourth ventricle. There is no  hydrocephalus. The brainstem is unremarkable. 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 Vascular: Vascular calcifications are again seen. There is no hyperdense vessel. Skull: Right occipital skull fracture is stable. Left zygomatic arch and temporal bone fractures are stable. Skullbase fractures cross in the left sphenoid wing and sphenoid sinuses are again noted with hemorrhage in the sphenoid sinus bilaterally. Left medial orbital wall fractures are present. No new fractures are present. Sinuses/Orbits: Blood layers in the sphenoid sinuses bilaterally. There is some fluid in posterior left ethmoid air cells, likely also blood. Maxillary sinuses and frontal sinuses are clear. The mastoid air cells are clear. Bilateral lens replacements are present. Globes and orbits are otherwise within normal limits. Of note, the left sphenoid fracture crosses the orbital apex. IMPRESSION: 1. Similar appearance of subdural and subarachnoid hemorrhage anteriorly about the left frontal lobe. 2. Diffuse subdural blood layers along the falx and left greater than right tentorium. 3. Intraventricular hemorrhage is more prominent on today's study without hydrocephalus. 4. Subarachnoid hemorrhage is now evident about the cerebellum. 5. Progressive edema associated with left frontal contusions. 6. Multiple skull and facial fractures again demonstrated. Left sphenoid wing fracture crosses the orbital apex in could involve the left optic nerve. Electronically Signed   By: San Morelle M.D.   On: 06/09/2017 07:30      Scheduled Meds: . atorvastatin  20 mg Oral q1800  . calcium-vitamin D  1 tablet Oral BID WC  . cholecalciferol  1,000 Units Oral q1800  . levETIRAcetam  500 mg Oral BID  . neomycin-bacitracin-polymyxin   Topical BID  . pantoprazole  40 mg Oral Daily  . potassium chloride  40 mEq Oral Daily  . triamterene-hydrochlorothiazide  1 tablet Oral Daily  . verapamil  240 mg Oral Daily    Continuous Infusions:    LOS: 2 days    Time spent in minutes: [place], MD [facility] Pager: [email or web address] Password [email or web address] 06/10/2017, 3:18 PM

## 2017-06-10 NOTE — Evaluation (Signed)
Occupational Therapy Evaluation Patient Details Name: Lori Boone MRN: [id number] DOB: 1925/06/29 Today's Date: 06/10/2017    History of Present Illness 84 fem with HTN, simple ovarian cysts stable since 2017, prior near syncope 2013 with no cause, progressive dementia over last year, reflux.  Pt sustained probable mech fall resulting in SDH and Subarachnoid hemorrhage, fractures of face and parietal and occipital bones.    Clinical Impression   PTA, pt was able to complete basic ADL independently but had assistance approximately 50% of the time from her daughter for IADL. She currently requires max assist +2 for stand-pivot simulated toilet transfers, total assistance for toileting hygiene, and mod assist for UB dressing tasks. Pt presents with decreased awareness, generalized weakness, poor motor planning skills, and decreased memory. Her daughter reports that pt has been diagnosed with dementia last year and has had a steady decline in cognition recently. Recommend short-term SNF level rehabilitation post-acute D/C and pt's daughter is in agreement. She is interested in having her mom receive rehabilitation in [place] at the [facility] (as this is where daughter lives) and then potentially transition to memory care once functionally improved. OT will continue to follow while admitted.    Follow Up Recommendations  SNF;Supervision/Assistance - 24 hour    Equipment Recommendations       Recommendations for Other Services       Precautions / Restrictions Precautions Precautions: Fall;Cervical Required Braces or Orthoses: Cervical Brace Cervical Brace: Soft collar;At all times Restrictions Weight Bearing Restrictions: No      Mobility Bed Mobility Overal bed mobility: Needs Assistance Bed Mobility: Rolling;Sidelying to Sit Rolling: Max assist;+2 for physical assistance Sidelying to sit: Max assist;+2 for physical assistance       General bed mobility comments: required max  assist to come to EOB.   Transfers Overall transfer level: Needs assistance Equipment used: 2 person hand held assist Transfers: Sit to/from [facility] Sit to Stand: Max assist;+2 physical assistance Stand pivot transfers: Max assist;+2 physical assistance       General transfer comment: Pt needed max assist to stand with  alot of assist given at buttocks to stand.  Pt foley leaked tehrefore assisted pt with cleaning and changing gown.  Pt with difficulty achieveing full upright stance with both LEs remaining bent somewhat and pt with bil knee instability needing close max assist for transfer with 2 persons needed.     Balance Overall balance assessment: Needs assistance Sitting-balance support: No upper extremity supported;Feet supported Sitting balance-Leahy Scale: Zero Sitting balance - Comments: Needed max assist to stay sitting on EOB.  Pt short and feet had difficulty touching floor therefore she had trouble maintaining balance at EOB.      Standing balance support: Bilateral upper extremity supported;During functional activity Standing balance-Leahy Scale: Zero Standing balance comment: max assist to stand and could not stand fully upright                           ADL either performed or assessed with clinical judgement   ADL Overall ADL's : Needs assistance/impaired Eating/Feeding: Moderate assistance;Sitting   Grooming: Minimal assistance;Sitting   Upper Body Bathing: Moderate assistance;Sitting   Lower Body Bathing: Total assistance;Sit to/from stand   Upper Body Dressing : Moderate assistance;Sitting   Lower Body Dressing: Total assistance;Sit to/from stand   Toilet Transfer: Maximal assistance;+2 for physical assistance;Stand-pivot   Toileting- Clothing Manipulation and Hygiene: Total assistance;Sit to/from stand  Functional mobility during ADLs: Maximal assistance;+2 for physical assistance(stand-pivot only) General ADL  Comments: Pt with noted difficulties planning motor movement.      Vision Baseline Vision/History: Wears glasses Wears Glasses: Reading only Additional Comments: Will continue to assess.      Perception     Praxis Praxis Praxis tested?: Deficits Deficits: Initiation;Ideomotor;Limb apraxia;Organization Praxis-Other Comments: Difficulty with bed mobility and MMT noted.     Pertinent Vitals/Pain Pain Assessment: No/denies pain     Hand Dominance Right   Extremity/Trunk Assessment Upper Extremity Assessment Upper Extremity Assessment: Generalized weakness   Lower Extremity Assessment Lower Extremity Assessment: Generalized weakness   Cervical / Trunk Assessment Cervical / Trunk Assessment: Kyphotic   Communication Communication Communication: No difficulties;Expressive difficulties;HOH   Cognition Arousal/Alertness: Awake/alert Behavior During Therapy: Flat affect Overall Cognitive Status: Impaired/Different from baseline Area of Impairment: Orientation;Memory;Following commands;Safety/judgement;Awareness;Problem solving                 Orientation Level: Disoriented to;Place;Time;Situation(if given options, could respond with correct option)   Memory: Decreased short-term memory;Decreased recall of precautions Following Commands: Follows one step commands inconsistently Safety/Judgement: Decreased awareness of safety;Decreased awareness of deficits Awareness: Intellectual Problem Solving: Slow processing;Decreased initiation;Difficulty sequencing;Requires verbal cues;Requires tactile cues General Comments: daughter says pt has been on dementia med for over a year and it has worsened to the point she cannot stay alone any more.    General Comments  Removed 5 L O2 via briefly with pt desaturating to 84% and rebounding to 95% with pursed lip breathing.     Exercises     Shoulder Instructions      Home Living Family/patient expects to be discharged to:: Private  residence Living Arrangements: Alone Available Help at Discharge: Family;Available PRN/intermittently(daughter had been staying 50% of time to help) Type of Home: House Home Access: Level entry     Home Layout: One level     Bathroom Shower/Tub: Occupational psychologist: Standard     Home Equipment: Cane - single point;Walker - 2 wheels          Prior Functioning/Environment Level of Independence: Independent;Needs assistance  Gait / Transfers Assistance Needed: ambulated without device ADL's / Homemaking Assistance Needed: daughter cooked, pt I with ADLs   Comments: family member in room reports that pt had been driving as well        OT Problem List: Decreased strength;Decreased activity tolerance;Impaired balance (sitting and/or standing);Decreased cognition;Decreased safety awareness;Decreased knowledge of use of DME or AE;Decreased knowledge of precautions;Impaired UE functional use;Pain      OT Treatment/Interventions: Self-care/ADL training;Therapeutic exercise;DME and/or AE instruction;Energy conservation;Therapeutic activities;Patient/family education;Balance training;Cognitive remediation/compensation;Visual/perceptual remediation/compensation    OT Goals(Current goals can be found in the care plan section) Acute Rehab OT Goals Patient Stated Goal: unable to state OT Goal Formulation: With patient Time For Goal Achievement: 06/24/17 Potential to Achieve Goals: Good ADL Goals Pt Will Perform Grooming: with set-up;sitting Pt Will Perform Lower Body Dressing: with min assist;sit to/from stand Pt Will Transfer to Toilet: ambulating;bedside commode;with min assist Pt Will Perform Toileting - Clothing Manipulation and hygiene: with min assist;sit to/from stand Additional ADL Goal #1: Pt will demonstrate improved motor planning skills to complete self-feeding tasks without assistance for initiation. Additional ADL Goal #2: Pt will demonstrate emergent  awareness during seated grooming tasks.  OT Frequency: Min 2X/week   Barriers to D/C:            Co-evaluation PT/OT/SLP Co-Evaluation/Treatment: Yes Reason for Co-Treatment: Complexity of the patient's impairments (  multi-system involvement) PT goals addressed during session: Mobility/safety with mobility OT goals addressed during session: ADL's and self-care      AM-PAC PT "6 Clicks" Daily Activity     Outcome Measure Help from another person eating meals?: A Lot Help from another person taking care of personal grooming?: A Little Help from another person toileting, which includes using toliet, bedpan, or urinal?: Total Help from another person bathing (including washing, rinsing, drying)?: A Lot Help from another person to put on and taking off regular upper body clothing?: A Lot Help from another person to put on and taking off regular lower body clothing?: A Lot 6 Click Score: 12   End of Session Equipment Utilized During Treatment: Gait belt;Oxygen Nurse Communication: Mobility status  Activity Tolerance: Patient tolerated treatment well Patient left: in chair;with call bell/phone within reach;with chair alarm set  OT Visit Diagnosis: Muscle weakness (generalized) (M62.81);Other symptoms and signs involving cognitive function                Time: 0263-7858 OT Time Calculation (min): 30 min Charges:  OT General Charges $OT Visit: 1 Visit OT Evaluation $OT Eval Moderate Complexity: 1 Mod G-Codes:     Norman Herrlich, MS OTR/L  Pager: [place] A Kaid Seeberger 06/10/2017, 3:37 PM

## 2017-06-11 ENCOUNTER — Inpatient Hospital Stay (HOSPITAL_COMMUNITY): Payer: Medicare Other

## 2017-06-11 DIAGNOSIS — I361 Nonrheumatic tricuspid (valve) insufficiency: Secondary | ICD-10-CM

## 2017-06-11 DIAGNOSIS — J9601 Acute respiratory failure with hypoxia: Secondary | ICD-10-CM

## 2017-06-11 LAB — ECHOCARDIOGRAM COMPLETE
Ao-asc: 24 cm
CHL CUP MV DEC (S): 261
CHL CUP PV REG GRAD DIAS: 8 mmHg
CHL CUP TV REG PEAK VELOCITY: 381 cm/s
E decel time: 261 msec
FS: 44 % (ref 28–44)
Height: 5 in
IV/PV OW: 1.02
LA ID, A-P, ES: 36 mm
LA vol A4C: 36 ml
LA vol index: 18.3 mL/m2
LA vol: 41.9 mL
LADIAMINDEX: 1.57 cm/m2
LEFT ATRIUM END SYS DIAM: 36 mm
LV PW d: 9.99 mm — AB (ref 0.6–1.1)
LVOT area: 2.27 cm2
LVOT diameter: 17 mm
MV Peak grad: 11 mmHg
MV pk E vel: 163 m/s
MVPKAVEL: 166 m/s
PV Reg vel dias: 145 cm/s
TAPSE: 22.3 mm
TRMAXVEL: 381 cm/s
WEIGHTICAEL: 4081.16 [oz_av]

## 2017-06-11 LAB — CBC
HEMATOCRIT: 37.4 % (ref 36.0–46.0)
HEMOGLOBIN: 12.2 g/dL (ref 12.0–15.0)
MCH: 30.7 pg (ref 26.0–34.0)
MCHC: 32.6 g/dL (ref 30.0–36.0)
MCV: 94 fL (ref 78.0–100.0)
Platelets: 237 10*3/uL (ref 150–400)
RBC: 3.98 MIL/uL (ref 3.87–5.11)
RDW: 13.4 % (ref 11.5–15.5)
WBC: 13.4 10*3/uL — AB (ref 4.0–10.5)

## 2017-06-11 LAB — BASIC METABOLIC PANEL
ANION GAP: 10 (ref 5–15)
BUN: 23 mg/dL — ABNORMAL HIGH (ref 6–20)
CO2: 29 mmol/L (ref 22–32)
Calcium: 9 mg/dL (ref 8.9–10.3)
Chloride: 98 mmol/L — ABNORMAL LOW (ref 101–111)
Creatinine, Ser: 0.69 mg/dL (ref 0.44–1.00)
GFR calc Af Amer: >60 mL/min (ref >60)
Glucose, Bld: 121 mg/dL — ABNORMAL HIGH (ref 65–99)
POTASSIUM: 3.7 mmol/L (ref 3.5–5.1)
SODIUM: 137 mmol/L (ref 135–145)

## 2017-06-11 MED ORDER — BISACODYL 10 MG RE SUPP: 10.0000 mg | Freq: Every day | RECTAL | Status: DC | PRN

## 2017-06-11 MED ORDER — POLYETHYLENE GLYCOL 3350 17 G PO PACK
17.0000 g | PACK | Freq: Every day | ORAL | Status: DC
  Administered 2017-06-11 – 2017-06-17 (×3): 17 g via ORAL
  Filled 2017-06-11 (×7): qty 1

## 2017-06-11 MED ORDER — FUROSEMIDE 10 MG/ML IJ SOLN
40.0000 mg | Freq: Once | INTRAMUSCULAR | Status: AC
  Administered 2017-06-11: 40 mg via INTRAVENOUS
  Filled 2017-06-11: qty 4

## 2017-06-11 MED ORDER — DONEPEZIL HCL 5 MG PO TABS
5.0000 mg | ORAL_TABLET | Freq: Every day | ORAL | Status: DC
  Administered 2017-06-11 – 2017-06-16 (×5): 5 mg via ORAL
  Filled 2017-06-11 (×5): qty 1

## 2017-06-11 NOTE — Progress Notes (Signed)
  Echocardiogram 2D Echocardiogram has been performed.  Khush Pasion G Chrystel Barefield 06/11/2017, 2:11 PM

## 2017-06-11 NOTE — Progress Notes (Signed)
PROGRESS NOTE    Lori Boone   FIE:[id number]  DOB: [birth year]  DOA: 06/08/2017 PCP: Burnard Bunting, MD   Brief Narrative:  Lori Boone with HTN, syncope,mild dementia, GERD admitted for a fall and found to have a subdural and subarachnoid hemorrhage, multiple skull and facial fractures admitted to [facility] as the ER doctor thought she was comfort care.    Subjective: Per daughter, she has not had any issues since last night. We have dicussed the plan for her mother extensively today. The daughter is wanting her to go to a SNF in [place] near her home.   Assessment & Plan:   Active Problems:   SDH/  Subarachnoid bleed - conservative management- NS has signed off   Facial fractures - "Right occipital skull fracture is stable. Left zygomatic arch and temporal bone fractures are stable. Skullbase fractures cross in the left sphenoid wing and sphenoid sinuses are again noted with hemorrhage in the sphenoid sinus bilaterally. Left medial orbital wall fractures are present. " - per ENT, fracture should heal without intervention- he recommends f/u as needed  Acute encephalopathy - underlying dementia- new bleed may be affecting mentation- she remains oriented only to self  Hypoxia - pulse ox in high 80s- CXR suggestive of fluid overload- awaiting ECHO - will give Lasix today and start incentive spirometry  Leukocytosis - improving- no sign of infection    Hyperlipidemia - on Statin at home    GERD (gastroesophageal reflux disease) -   PPI    Hypertension - on Maxzide and Verapamil  DVT prophylaxis: SCDs Code Status: DNR Family Communication: daughter at bedside Disposition Plan: working with PT- follow Consultants:   NS  ENT Procedures:    Antimicrobials:  Anti-infectives (From admission, onward)   None       Objective: Vitals:   06/11/17 0300 06/11/17 0337 06/11/17 0500 06/11/17 0832  BP:  (!) 133/54  (!) 140/59  Pulse: (!) 58       Resp: 17     Temp:  97.8 F (36.6 C)    TempSrc:  Axillary    SpO2: 97%     Weight:   115.7 kg (255 lb 1.2 oz)   Height:        Intake/Output Summary (Last 24 hours) at 06/11/2017 1111 Last data filed at 06/11/2017 0940 Gross per 24 hour  Intake 900 ml  Output 2350 ml  Net -1450 ml   Filed Weights   06/09/17 0350 06/10/17 0543 06/11/17 0500  Weight: 53.8 kg (118 lb 9.7 oz) 53.8 kg (118 lb 9.7 oz) 115.7 kg (255 lb 1.2 oz)    Examination: General exam: Appears comfortable  HEENT: PERRLA, oral mucosa moist, no sclera icterus or thrush Respiratory system: Clear to auscultation. Respiratory effort normal. Cardiovascular system: S1 & S2 heard, RRR.  No murmurs  Gastrointestinal system: Abdomen soft, non-tender, nondistended. Normal bowel sound. No organomegaly Central nervous system: Alert - confused- No focal neurological deficits. Extremities: No cyanosis, clubbing or edema Skin: No rashes or ulcers Psychiatry:  Mood & affect appropriate.    Data Reviewed: I have personally reviewed following labs and imaging studies  CBC: Recent Labs  Lab 06/08/17 0741 06/09/17 0511 06/10/17 0454 06/11/17 0805  WBC 15.2* 16.4* 16.5* 13.4*  NEUTROABS 13.8*  --  13.9*  --   HGB 13.1 11.7* 12.4 12.2  HCT 38.2 36.5 37.7 37.4  MCV 90.1 94.1 95.4 94.0  PLT 267 215 212 166   Basic Metabolic Panel: Recent  Labs  Lab 06/08/17 0741 06/09/17 0511 06/10/17 0454 06/11/17 0805  NA 136 136 141 137  K 3.1* 3.2* 3.7 3.7  CL 102 103 108 98*  CO2 24 23 24 29   GLUCOSE 185* 124* 131* 121*  BUN 24* 23* 23* 23*  CREATININE 0.81 0.72 0.77 0.69  CALCIUM 9.1 7.9* 8.7* 9.0   GFR: CrCl cannot be calculated (Unknown ideal weight.). Liver Function Tests: Recent Labs  Lab 06/08/17 0741 06/09/17 0511 06/10/17 0454  AST 35 24 22  ALT 22 18 16   ALKPHOS 108 83 87  BILITOT 0.9 1.0 0.7  PROT 6.0* 5.2* 5.5*  ALBUMIN 3.4* 2.7* 2.7*   No results for input(s): LIPASE, AMYLASE in the last 168  hours. No results for input(s): AMMONIA in the last 168 hours. Coagulation Profile: Recent Labs  Lab 06/09/17 0511  INR 1.13   Cardiac Enzymes: Recent Labs  Lab 06/08/17 0741  CKTOTAL 95   BNP (last 3 results) No results for input(s): PROBNP in the last 8760 hours. HbA1C: Recent Labs    06/08/17 1127  HGBA1C 5.7*   CBG: Recent Labs  Lab 06/09/17 0807 06/09/17 1842 06/10/17 0722  GLUCAP 115* 201* 129*   Lipid Profile: No results for input(s): CHOL, HDL, LDLCALC, TRIG, CHOLHDL, LDLDIRECT in the last 72 hours. Thyroid Function Tests: No results for input(s): TSH, T4TOTAL, FREET4, T3FREE, THYROIDAB in the last 72 hours. Anemia Panel: No results for input(s): VITAMINB12, FOLATE, FERRITIN, TIBC, IRON, RETICCTPCT in the last 72 hours. Urine analysis:    Component Value Date/Time   COLORURINE YELLOW 06/08/2017 0732   APPEARANCEUR CLEAR 06/08/2017 0732   LABSPEC 1.018 06/08/2017 0732   PHURINE 6.0 06/08/2017 0732   GLUCOSEU NEGATIVE 06/08/2017 0732   HGBUR NEGATIVE 06/08/2017 0732   BILIRUBINUR NEGATIVE 06/08/2017 0732   KETONESUR 5 (A) 06/08/2017 0732   PROTEINUR NEGATIVE 06/08/2017 0732   UROBILINOGEN 0.2 11/23/2013 2308   NITRITE NEGATIVE 06/08/2017 0732   LEUKOCYTESUR NEGATIVE 06/08/2017 0732   Sepsis Labs: @LABRCNTIP (procalcitonin:4,lacticidven:4) ) Recent Results (from the past 240 hour(s))  Blood culture (routine x 2)     Status: None (Preliminary result)   Collection Time: 06/08/17  7:46 AM  Result Value Ref Range Status   Specimen Description BLOOD RIGHT ANTECUBITAL  Final   Special Requests   Final    BOTTLES DRAWN AEROBIC AND ANAEROBIC Blood Culture adequate volume   Culture NO GROWTH 2 DAYS  Final   Report Status PENDING  Incomplete  Blood culture (routine x 2)     Status: None (Preliminary result)   Collection Time: 06/08/17  7:53 AM  Result Value Ref Range Status   Specimen Description BLOOD LEFT ANTECUBITAL  Final   Special Requests   Final     BOTTLES DRAWN AEROBIC AND ANAEROBIC Blood Culture adequate volume   Culture NO GROWTH 2 DAYS  Final   Report Status PENDING  Incomplete  MRSA PCR Screening     Status: None   Collection Time: 06/08/17  5:30 PM  Result Value Ref Range Status   MRSA by PCR NEGATIVE NEGATIVE Final    Comment:        The GeneXpert MRSA Assay (FDA approved for NASAL specimens only), is one component of a comprehensive MRSA colonization surveillance program. It is not intended to diagnose MRSA infection nor to guide or monitor treatment for MRSA infections.          Radiology Studies: Dg Chest 2 View  Result Date: 06/10/2017 CLINICAL DATA:  84 year old  female with weakness, dyspnea and hypoxia. EXAM: CHEST  2 VIEW COMPARISON:  06/08/2017 FINDINGS: The heart size and mediastinal contours are within normal limits. Interval development of vascular congestion and perihilar airspace opacities with small bilateral pleural effusions, left greater than right consistent with CHF. Moderate aortic atherosclerosis without aneurysmal dilatation. No acute osseous abnormality. IMPRESSION: Vascular congestion with perihilar airspace opacities and small bilateral pleural effusions compatible with mild CHF. Superimposed pneumonia would be difficult to entirely exclude. Electronically Signed   By: Ashley Royalty M.D.   On: 06/10/2017 13:36   Ct Head Wo Contrast  Result Date: 06/09/2017 CLINICAL DATA:  Subdural hematoma.  Facial fractures. EXAM: CT HEAD WITHOUT CONTRAST TECHNIQUE: Contiguous axial images were obtained from the base of the skull through the vertex without intravenous contrast. COMPARISON:  CT of the head and face 06/08/2017. FINDINGS: Brain: Left frontal subdural and subarachnoid hemorrhage is again noted. Hypoattenuating cortex compatible with contusion is more prominent on today's study. The frontal extra-axial hemorrhages not significantly changed. Blood products are again noted along the falx both anteriorly  and posteriorly. Bladder is layering along the tentorium, asymmetric on the left. Subarachnoid hemorrhage about the cerebellum has increased. Intraventricular hemorrhage is more prominent on today's study with blood layering in the posterior horns of both lateral ventricles. Minimal blood is seen along the inferior fourth ventricle. There is no hydrocephalus. The brainstem is unremarkable. 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 Vascular: Vascular calcifications are again seen. There is no hyperdense vessel. Skull: Right occipital skull fracture is stable. Left zygomatic arch and temporal bone fractures are stable. Skullbase fractures cross in the left sphenoid wing and sphenoid sinuses are again noted with hemorrhage in the sphenoid sinus bilaterally. Left medial orbital wall fractures are present. No new fractures are present. Sinuses/Orbits: Blood layers in the sphenoid sinuses bilaterally. There is some fluid in posterior left ethmoid air cells, likely also blood. Maxillary sinuses and frontal sinuses are clear. The mastoid air cells are clear. Bilateral lens replacements are present. Globes and orbits are otherwise within normal limits. Of note, the left sphenoid fracture crosses the orbital apex. IMPRESSION: 1. Similar appearance of subdural and subarachnoid hemorrhage anteriorly about the left frontal lobe. 2. Diffuse subdural blood layers along the falx and left greater than right tentorium. 3. Intraventricular hemorrhage is more prominent on today's study without hydrocephalus. 4. Subarachnoid hemorrhage is now evident about the cerebellum. 5. Progressive edema associated with left frontal contusions. 6. Multiple skull and facial fractures again demonstrated. Left sphenoid wing fracture crosses the orbital apex in could involve the left optic nerve. Electronically Signed   By: San Morelle M.D.   On: 06/09/2017 07:30      Scheduled Meds: . atorvastatin  20 mg Oral q1800  .  calcium-vitamin D  1 tablet Oral BID WC  . cholecalciferol  1,000 Units Oral q1800  . levETIRAcetam  500 mg Oral BID  . neomycin-bacitracin-polymyxin   Topical BID  . pantoprazole  40 mg Oral Daily  . polyethylene glycol  17 g Oral Daily  . potassium chloride  40 mEq Oral Daily  . triamterene-hydrochlorothiazide  1 tablet Oral Daily  . verapamil  240 mg Oral Daily   Continuous Infusions:    LOS: 3 days    Time spent in minutes: Wink, MD [facility] Pager: [email or web address] Password [email or web address] 06/11/2017, 11:11 AM

## 2017-06-12 ENCOUNTER — Other Ambulatory Visit (HOSPITAL_COMMUNITY): Payer: Medicare Other

## 2017-06-12 ENCOUNTER — Inpatient Hospital Stay (HOSPITAL_COMMUNITY): Payer: Medicare Other

## 2017-06-12 DIAGNOSIS — R0902 Hypoxemia: Secondary | ICD-10-CM

## 2017-06-12 DIAGNOSIS — K219 Gastro-esophageal reflux disease without esophagitis: Secondary | ICD-10-CM

## 2017-06-12 LAB — CBC
HEMATOCRIT: 41.9 % (ref 36.0–46.0)
Hemoglobin: 13.7 g/dL (ref 12.0–15.0)
MCH: 30.7 pg (ref 26.0–34.0)
MCHC: 32.7 g/dL (ref 30.0–36.0)
MCV: 93.9 fL (ref 78.0–100.0)
PLATELETS: 237 10*3/uL (ref 150–400)
RBC: 4.46 MIL/uL (ref 3.87–5.11)
RDW: 13.4 % (ref 11.5–15.5)
WBC: 14.9 10*3/uL — AB (ref 4.0–10.5)

## 2017-06-12 LAB — BASIC METABOLIC PANEL
Anion gap: 10 (ref 5–15)
BUN: 26 mg/dL — AB (ref 6–20)
CHLORIDE: 95 mmol/L — AB (ref 101–111)
CO2: 29 mmol/L (ref 22–32)
CREATININE: 0.74 mg/dL (ref 0.44–1.00)
Calcium: 9.1 mg/dL (ref 8.9–10.3)
Glucose, Bld: 129 mg/dL — ABNORMAL HIGH (ref 65–99)
POTASSIUM: 4.1 mmol/L (ref 3.5–5.1)
SODIUM: 134 mmol/L — AB (ref 135–145)

## 2017-06-12 MED ORDER — QUETIAPINE FUMARATE 25 MG PO TABS
25.0000 mg | ORAL_TABLET | Freq: Every day | ORAL | Status: DC
  Administered 2017-06-13 – 2017-06-16 (×4): 25 mg via ORAL
  Filled 2017-06-12 (×4): qty 1

## 2017-06-12 MED ORDER — SODIUM CHLORIDE 0.9 % IV SOLN
500.0000 mg | Freq: Once | INTRAVENOUS | Status: AC
  Administered 2017-06-12: 500 mg via INTRAVENOUS
  Filled 2017-06-12: qty 5

## 2017-06-12 MED ORDER — SODIUM CHLORIDE 0.9 % IV SOLN
400.0000 mg | Freq: Once | INTRAVENOUS | Status: AC
  Administered 2017-06-12: 400 mg via INTRAVENOUS
  Filled 2017-06-12: qty 4

## 2017-06-12 MED ORDER — SODIUM CHLORIDE 0.9 % IV BOLUS (SEPSIS)
500.0000 mL | Freq: Once | INTRAVENOUS | Status: AC
  Administered 2017-06-13: 500 mL via INTRAVENOUS

## 2017-06-12 MED ORDER — ORAL CARE MOUTH RINSE
15.0000 mL | Freq: Two times a day (BID) | OROMUCOSAL | Status: DC
  Administered 2017-06-12 – 2017-06-17 (×9): 15 mL via OROMUCOSAL

## 2017-06-12 NOTE — Progress Notes (Signed)
Occupational Therapy Treatment Patient Details Name: Lori Boone MRN: [id number] DOB: [birth year] Today's Date: 06/12/2017    History of present illness 64 fem with HTN, simple ovarian cysts stable since 2017, prior near syncope 2013 with no cause, progressive dementia over last year, reflux.  Pt sustained probable mech fall resulting in SDH and Subarachnoid hemorrhage, fractures of face and parietal and occipital bones.    OT comments  Pt progressing towards OT goals this session. Improved ability to assist with transfers. Pt able to use BSC for BM and urination with at least 3 sit to stand with decreased assist needed each time. Pt able to participate in UB and LB bathing with better motor planning, but still required multimodal cues. Pt continues to benefit from skilled OT in the acute setting and afterwards at SNF.   Of note: O2 saturations dropping consistenly throughout session with activity to 78% on 8L O2.   Follow Up Recommendations  SNF;Supervision/Assistance - 24 hour    Equipment Recommendations  Other (comment)(defer to next venue)    Recommendations for Other Services      Precautions / Restrictions Precautions Precautions: Fall Precaution Comments: soft collar orders discontinued 1/1 Restrictions Weight Bearing Restrictions: No       Mobility Bed Mobility Overal bed mobility: Needs Assistance Bed Mobility: Supine to Sit     Supine to sit: Mod assist;+2 for physical assistance;+2 for safety/equipment;HOB elevated     General bed mobility comments: multimodal cues for sequencing, use of bed pad to assist hips EOB, assist for trunk elevation  Transfers Overall transfer level: Needs assistance Equipment used: Rolling walker (2 wheeled) Transfers: Sit to/from [facility] Sit to Stand: Mod assist;From elevated surface;+2 safety/equipment;Min assist Stand pivot transfers: Mod assist;+2 safety/equipment;From elevated surface       General  transfer comment: initially from bed (with urinary urgency) Pt was a mod A to [facility] with 2 person HHA, after BSC and with RW Pt was a min guard assist for transfers    Balance Overall balance assessment: Needs assistance Sitting-balance support: No upper extremity supported;Feet supported Sitting balance-Leahy Scale: Good Sitting balance - Comments: able to sit min gin at EOB and on BSC   Standing balance support: Bilateral upper extremity supported;During functional activity Standing balance-Leahy Scale: Poor Standing balance comment: relies on BUE external support                           ADL either performed or assessed with clinical judgement   ADL Overall ADL's : Needs assistance/impaired         Upper Body Bathing: Moderate assistance;Sitting Upper Body Bathing Details (indicate cue type and reason): BSC Lower Body Bathing: Maximal assistance;Sit to/from stand Lower Body Bathing Details (indicate cue type and reason): sit to stand from [place] Body Dressing: Total assistance;Sit to/from stand   Toilet Transfer: Moderate assistance;Stand-pivot;BSC;RW Toilet Transfer Details (indicate cue type and reason): mod +2 without RW - Pt performed much better with RW Toileting- Clothing Manipulation and Hygiene: Sit to/from stand;Maximal assistance Toileting - Clothing Manipulation Details (indicate cue type and reason): Pt able to perform front peri care lateral leans     Functional mobility during ADLs: Moderate assistance;Rolling walker;Cueing for sequencing;Cueing for safety General ADL Comments: Pt with noted difficulties planning motor movement.      Vision       Perception     Praxis      Cognition Arousal/Alertness: Awake/alert  Behavior During Therapy: Flat affect Overall Cognitive Status: Impaired/Different from baseline Area of Impairment: Following commands;Awareness;Safety/judgement                     Memory: Decreased short-term  memory;Decreased recall of precautions Following Commands: Follows one step commands with increased time Safety/Judgement: Decreased awareness of safety;Decreased awareness of deficits Awareness: Emergent Problem Solving: Slow processing;Decreased initiation;Difficulty sequencing;Requires verbal cues;Requires tactile cues          Exercises Exercises: General Upper Extremity General Exercises - Upper Extremity Shoulder Flexion: AAROM;Both;5 reps;Seated   Shoulder Instructions       General Comments Pt's daughter present for the entire session, O2 saturations dropping consistenly throughout session with activity to 78% on 8L O2.    Pertinent Vitals/ Pain       Pain Assessment: Faces Faces Pain Scale: No hurt Pain Intervention(s): Monitored during session  Home Living                                          Prior Functioning/Environment              Frequency  Min 2X/week        Progress Toward Goals  OT Goals(current goals can now be found in the care plan section)  Progress towards OT goals: Progressing toward goals  Acute Rehab OT Goals Patient Stated Goal: get her to SNF OT Goal Formulation: With family Time For Goal Achievement: 06/24/17 Potential to Achieve Goals: Good  Plan Discharge plan remains appropriate;Frequency remains appropriate    Co-evaluation    PT/OT/SLP Co-Evaluation/Treatment: Yes Reason for Co-Treatment: For patient/therapist safety;To address functional/ADL transfers PT goals addressed during session: Mobility/safety with mobility OT goals addressed during session: ADL's and self-care      AM-PAC PT "6 Clicks" Daily Activity     Outcome Measure   Help from another person eating meals?: A Lot Help from another person taking care of personal grooming?: A Little Help from another person toileting, which includes using toliet, bedpan, or urinal?: A Little Help from another person bathing (including washing,  rinsing, drying)?: A Lot Help from another person to put on and taking off regular upper body clothing?: A Lot Help from another person to put on and taking off regular lower body clothing?: A Lot 6 Click Score: 14    End of Session Equipment Utilized During Treatment: Gait belt;Rolling walker;Oxygen(8L)  OT Visit Diagnosis: Muscle weakness (generalized) (M62.81);Other symptoms and signs involving cognitive function   Activity Tolerance Patient tolerated treatment well   Patient Left in chair;with call bell/phone within reach;with chair alarm set;with family/visitor present   Nurse Communication          Time: 7035-0093 OT Time Calculation (min): 45 min  Charges: OT General Charges $OT Visit: 1 Visit OT Treatments $Self Care/Home Management : 8-22 mins $Therapeutic Activity: 8-22 mins  Hulda Humphrey OTR/L [place] 06/12/2017, 10:21 AM

## 2017-06-12 NOTE — Plan of Care (Signed)
Pt and family calm and cooperative with care, appears to be progressing in all areas

## 2017-06-12 NOTE — NC FL2 (Signed)
[place] LEVEL OF CARE SCREENING TOOL     IDENTIFICATION  Patient Name: Lori Boone Birthdate: [birth year] Sex: female Admission Date (Current Location): 06/08/2017  [facility] and [place] Number:  Herbalist and Address:  [facility], [place] [address] [place], [place] [address]      Provider Number: [id number]  Attending Physician Name and Address:  Debbe Odea, MD  Relative Name and Phone Number:       Current Level of Care: Hospital Recommended Level of Care: [place] Prior Approval Number:    Date Approved/Denied:   PASRR Number: [id number]  Discharge Plan: SNF    Current Diagnoses: Patient Active Problem List   Diagnosis Date Noted  . Subarachnoid bleed ([place]) 06/10/2017  . Dementia 06/10/2017  . Closed extensive facial fractures ([place]) 06/10/2017  . SDH (subdural hematoma) ([place]) 06/08/2017  . Hyperlipidemia 06/08/2017  . Elevated glucose level 06/08/2017  . GERD (gastroesophageal reflux disease) 06/08/2017  . Hypertension 12/06/2015  . Arthritis of knee, degenerative 05/04/2015  . Arthritis of ankle, degenerative 05/04/2015  . Hearing aid worn 04/19/2015  . Near syncope 09/03/2013  . Palpitations 07/31/2012  . Dyspnea 04/30/2011  . Chest pain 04/18/2011    Orientation RESPIRATION BLADDER Height & Weight     Self  O2(see DC summary- has been requiring [place] during hospital stay) Incontinent, External catheter Weight: (pt needs to be lifted so that bed can be zeroed) Height:  (!) 5" (12.7 cm)  BEHAVIORAL SYMPTOMS/MOOD NEUROLOGICAL BOWEL NUTRITION STATUS      Continent Diet(regular)  AMBULATORY STATUS COMMUNICATION OF NEEDS Skin   Extensive Assist Verbally Normal                       Personal Care Assistance Level of Assistance  Bathing, Dressing Bathing Assistance: Maximum assistance   Dressing Assistance: Maximum assistance     Functional Limitations Info              SPECIAL CARE FACTORS FREQUENCY  PT (By licensed PT), OT (By licensed OT)     PT Frequency: 5/wk OT Frequency: 5/wk            Contractures      Additional Factors Info  Code Status, Allergies Code Status Info: DNR Allergies Info: Codeine, Penicillins           Current Medications (06/12/2017):  This is the current hospital active medication list Current Facility-Administered Medications  Medication Dose Route Frequency Provider Last Rate Last Dose  . acetaminophen (TYLENOL) tablet 650 mg  650 mg Oral Q6H PRN Rondel Jumbo, PA-C   650 mg at 06/12/17 1146   Or  . acetaminophen (TYLENOL) suppository 650 mg  650 mg Rectal Q6H PRN Rondel Jumbo, PA-C      . atorvastatin (LIPITOR) tablet 20 mg  20 mg Oral q1800 Rondel Jumbo, PA-C   20 mg at 06/11/17 1725  . bisacodyl (DULCOLAX) suppository 10 mg  10 mg Rectal Daily PRN Rondel Jumbo, PA-C      . calcium-vitamin D (OSCAL WITH D) 500-200 MG-UNIT per tablet 1 tablet  1 tablet Oral BID WC Rondel Jumbo, PA-C   1 tablet at 06/12/17 1114  . cholecalciferol (VITAMIN D) tablet 1,000 Units  1,000 Units Oral q1800 Rondel Jumbo, PA-C   1,000 Units at 06/11/17 1725  . donepezil (ARICEPT) tablet 5 mg  5 mg Oral QHS Debbe Odea, MD   5  mg at 06/11/17 2216  . HYDROcodone-acetaminophen (NORCO/VICODIN) 5-325 MG per tablet 1-2 tablet  1-2 tablet Oral Q12H PRN Nita Sells, MD   1 tablet at 06/09/17 1759  . ketorolac (TORADOL) 15 MG/ML injection 15 mg  15 mg Intravenous Q6H PRN Rondel Jumbo, PA-C   15 mg at 06/08/17 1222  . levETIRAcetam (KEPPRA) tablet 500 mg  500 mg Oral BID Nita Sells, MD   500 mg at 06/12/17 1114  . MEDLINE mouth rinse  15 mL Mouth Rinse BID Debbe Odea, MD   15 mL at 06/12/17 1136  . neomycin-bacitracin-polymyxin (NEOSPORIN) ointment   Topical BID Debbe Odea, MD      . ondansetron (ZOFRAN) tablet 4 mg  4 mg Oral Q6H PRN Rondel Jumbo, PA-C       Or  . ondansetron (ZOFRAN) injection  4 mg  4 mg Intravenous Q6H PRN Rondel Jumbo, PA-C      . pantoprazole (PROTONIX) EC tablet 40 mg  40 mg Oral Daily Sharene Butters E, PA-C   40 mg at 06/12/17 1114  . polyethylene glycol (MIRALAX / GLYCOLAX) packet 17 g  17 g Oral Daily Debbe Odea, MD   17 g at 06/11/17 0930  . potassium chloride SA (K-DUR,KLOR-CON) CR tablet 40 mEq  40 mEq Oral Daily Nita Sells, MD   40 mEq at 06/12/17 1113  . senna-docusate (Senokot-S) tablet 1 tablet  1 tablet Oral QHS PRN Rondel Jumbo, PA-C      . triamterene-hydrochlorothiazide (MAXZIDE) 75-50 MG per tablet 1 tablet  1 tablet Oral Daily Rondel Jumbo, PA-C   1 tablet at 06/12/17 1115  . verapamil (CALAN-SR) CR tablet 240 mg  240 mg Oral Daily Rondel Jumbo, PA-C   240 mg at 06/12/17 1115     Discharge Medications: Please see discharge summary for a list of discharge medications.  Relevant Imaging Results:  Relevant Lab Results:   Additional Information SS#: [id number]  Jorge Ny, LCSW

## 2017-06-12 NOTE — Progress Notes (Signed)
Physical Therapy Treatment Patient Details Name: Lori Boone MRN: [id number] DOB: [birth year] Today's Date: 06/12/2017    History of Present Illness 89 fem with HTN, simple ovarian cysts stable since 2017, prior near syncope 2013 with no cause, progressive dementia over last year, reflux.  Pt sustained probable mech fall resulting in SDH and Subarachnoid hemorrhage, fractures of face and parietal and occipital bones.     PT Comments    Pt admitted with above diagnosis. Pt currently with functional limitations due to balance and endurance deficits. Pt was able to stand pivot withRW with min assist requiring less assist than last visit. Pt limited due to desat to 78% on 8LHFNC as well as kept urinating and having BM.  Will continue acute PT.   Pt will benefit from skilled PT to increase their independence and safety with mobility to allow discharge to the venue listed below.     Follow Up Recommendations  SNF;Supervision/Assistance - 24 hour     Equipment Recommendations  Other (comment)(TBA)    Recommendations for Other Services       Precautions / Restrictions Precautions Precautions: Fall Precaution Comments: soft collar orders discontinued 1/1 Restrictions Weight Bearing Restrictions: No    Mobility  Bed Mobility Overal bed mobility: Needs Assistance Bed Mobility: Supine to Sit     Supine to sit: Mod assist;+2 for physical assistance;+2 for safety/equipment;HOB elevated     General bed mobility comments: multimodal cues for sequencing, use of bed pad to assist hips EOB, assist for trunk elevation  Transfers Overall transfer level: Needs assistance Equipment used: Rolling walker (2 wheeled) Transfers: Sit to/from [facility] Sit to Stand: Mod assist;From elevated surface;+2 safety/equipment;Min assist Stand pivot transfers: Mod assist;+2 safety/equipment;From elevated surface       General transfer comment: initially from bed (with urinary urgency) Pt  was a mod A to [facility] with 2 person HHA, after BSC and with RW Pt was a min guard assist for transfers.  Everytime pt would stand to be cleaned, pt continued to have BM and urinate so pt stood about 4 x prior to actually being finished.  Pt with poor awareness of personal needs due to dementia.   Ambulation/Gait                 Stairs            Wheelchair Mobility    Modified Rankin (Stroke Patients Only)       Balance Overall balance assessment: Needs assistance Sitting-balance support: No upper extremity supported;Feet supported Sitting balance-Leahy Scale: Good Sitting balance - Comments: able to sit min guard assist at EOB and on BSC.  Pt kept trying to stand up on her own prior to finisihing her BM.    Standing balance support: Bilateral upper extremity supported;During functional activity Standing balance-Leahy Scale: Poor Standing balance comment: relies on BUE external support with use of RW                            Cognition Arousal/Alertness: Awake/alert Behavior During Therapy: Flat affect Overall Cognitive Status: Impaired/Different from baseline Area of Impairment: Following commands;Awareness;Safety/judgement                     Memory: Decreased short-term memory;Decreased recall of precautions Following Commands: Follows one step commands with increased time Safety/Judgement: Decreased awareness of safety;Decreased awareness of deficits Awareness: Emergent Problem Solving: Slow processing;Decreased initiation;Difficulty sequencing;Requires verbal cues;Requires tactile cues  Exercises General Exercises - Upper Extremity Shoulder Flexion: AAROM;Both;5 reps;Seated General Exercises - Lower Extremity Ankle Circles/Pumps: AROM;Both;10 reps;Seated Long Arc Quad: AROM;Both;10 reps;Seated    General Comments General comments (skin integrity, edema, etc.): Pt's daughter present for the entire session, O2 saturations dropping  consistenly throughout session with activity to 78% on 8L HFNCO2.      Pertinent Vitals/Pain Pain Assessment: No/denies pain Faces Pain Scale: No hurt Pain Intervention(s): Monitored during session  Desat to 78% with activity on 8LHFNC.  Other VSS.   Home Living                      Prior Function            PT Goals (current goals can now be found in the care plan section) Acute Rehab PT Goals Patient Stated Goal: get her to SNF Progress towards PT goals: Progressing toward goals    Frequency    Min 3X/week      PT Plan Current plan remains appropriate    Co-evaluation PT/OT/SLP Co-Evaluation/Treatment: Yes Reason for Co-Treatment: For patient/therapist safety PT goals addressed during session: Mobility/safety with mobility OT goals addressed during session: ADL's and self-care      AM-PAC PT "6 Clicks" Daily Activity  Outcome Measure  Difficulty turning over in bed (including adjusting bedclothes, sheets and blankets)?: Unable Difficulty moving from lying on back to sitting on the side of the bed? : Unable Difficulty sitting down on and standing up from a chair with arms (e.g., wheelchair, bedside commode, etc,.)?: Unable Help needed moving to and from a bed to chair (including a wheelchair)?: A Lot Help needed walking in hospital room?: Total Help needed climbing 3-5 steps with a railing? : Total 6 Click Score: 7    End of Session Equipment Utilized During Treatment: Gait belt;Oxygen Activity Tolerance: Patient limited by fatigue Patient left: in chair;with call bell/phone within reach;with chair alarm set;with family/visitor present Nurse Communication: Mobility status PT Visit Diagnosis: Unsteadiness on feet (R26.81);Muscle weakness (generalized) (M62.81)     Time: 4403-4742 PT Time Calculation (min) (ACUTE ONLY): 44 min  Charges:  $Therapeutic Activity: 8-22 mins                    G Codes:       [place] [phone] (pager)    Denice Paradise 06/12/2017, 11:45 AM

## 2017-06-12 NOTE — Clinical Social Work Note (Signed)
Clinical Social Work Assessment  Patient Details  Name: Lori Boone MRN: [id number] Date of Birth: [birth year]  Date of referral:  06/12/17               Reason for consult:  Facility Placement                Permission sought to share information with:  Facility Sport and exercise psychologist, Family Supports Permission granted to share information::  No(pt disoriented- spoke with next of kin)  Name::     Lendell Caprice  Agency::  SNF  Relationship::  dtr  Contact Information:     Housing/Transportation Living arrangements for the past 2 months:  [place] of Information:  Siblings Patient Interpreter Needed:  None Criminal Activity/Legal Involvement Pertinent to Current Situation/Hospitalization:  No - Comment as needed Significant Relationships:  Adult Children Lives with:  Self Do you feel safe going back to the place where you live?  No Need for family participation in patient care:  Yes (Comment)(decision making)  Care giving concerns:  Pt normally living at home independently.  Has daughter who is very involved but does not have 24 hour supervision- per dtr pt had declined significantly in mental status and is too weak to return home alone or with family at this time.   Social Worker assessment / plan:  CSW spoke with pt and pt dtr regarding PT recommendation for SNF.  CSW attempted to engage pt in conversation- pt alert but not very responsive- dtr reports that she does not understand anything that is going on and throughout conversation pt did not react or respond to what was being said.  CSW spoke with pt dtr regarding PT recommendation for SNF.  Pt dtr has already started looking into potential options for SNF and interested in having pt moved to [place] area where she will be closer to family.  CSW discussed short term SNF vs LTC.  CSW explained possible barriers to long distance placement.  CSW explained PTAR requires payment up front and provided with estimate of  1,600- pt dtr expressed understanding and does not think this would be a barrier.  CSW also explained that if chosen facility does not have a bed we can not hold pt here for preferred facility and would have to pursue local placement- dtr expresses understanding and willing to consider if necessary.  Employment status:  Retired Health visitor, Managed Care PT Recommendations:  [place] / Referral to community resources:  [place]  Patient/Family's Response to care:  Pt dtr hopeful pt can go to SNF at [facility] near [place] and then transition to [place] in their ALF where pt has been on a waiting list.  Patient/Family's Understanding of and Emotional Response to Diagnosis, Current Treatment, and Prognosis:  Pt dtr very involved and understanding of pt condition.  Hopeful pt mental status and mobility will improve but realistic that pt might not be appropriate for return to independent living.  Emotional Assessment Appearance:  Appears stated age Attitude/Demeanor/Rapport:    Affect (typically observed):  Withdrawn, Quiet Orientation:  Oriented to Self, Fluctuating Orientation (Suspected and/or reported Sundowners) Alcohol / Substance use:  Not Applicable Psych involvement (Current and /or in the community):  No (Comment)  Discharge Needs  Concerns to be addressed:  Care Coordination Readmission within the last 30 days:  No Current discharge risk:  Physical Impairment Barriers to Discharge:  Continued Medical Work up   Jorge Ny, LCSW 06/12/2017,  2:02 PM

## 2017-06-12 NOTE — Progress Notes (Signed)
PROGRESS NOTE    Lori Boone   QQP:[id number]  DOB: 24-Jun-1925  DOA: 06/08/2017 PCP: Burnard Bunting, MD   Brief Narrative:  Lori Boone with HTN, syncope,mild dementia, GERD admitted for a fall and found to have a subdural and subarachnoid hemorrhage, multiple skull and facial fractures admitted to [facility] as the ER doctor thought she was comfort care.    Subjective: Per daughter, the patient was very restless last night and was trying to get out of the bed. She has been sleeping on and off during the day. Still not much cough.   Assessment & Plan:   Active Problems:   SDH/  Subarachnoid bleed - conservative management- NS has signed off   Facial fractures - "Right occipital skull fracture is stable. Left zygomatic arch and temporal bone fractures are stable. Skullbase fractures cross in the left sphenoid wing and sphenoid sinuses are again noted with hemorrhage in the sphenoid sinus bilaterally. Left medial orbital wall fractures are present. " - per ENT, fracture should heal without intervention- he recommends f/u as needed  Acute encephalopathy - underlying dementia- new bleed may be affecting mentation- she remains oriented only to self - will try Seroquel tonight as she is beginning to have sundowning which is likely hospital acquired delirium - have also told RN that a sitter can be ordered if she is getting agitated and trying to get out of bed  Hypoxia - pulse ox in high 80s - 1/1-  CXR suggestive of fluid overload- given 1 dose of 40 meq of IV Lasix and had > 1 L urine output - ECHO does not reveal any significant heart failure - see report below- no need to continue Lasix- lung clear on exam today-  - continue incentive spirometry- currently only needing 2 L of O2 and I suspect that this is due to atelectasis- she is a mouth breather as well - will need to go to SNF with O2  Leukocytosis -  no sign of infection noted- no fevers- cont to  follow    Hyperlipidemia - on Statin at home    GERD (gastroesophageal reflux disease) -   PPI    Hypertension - on Maxzide and Verapamil  DVT prophylaxis: SCDs Code Status: DNR Family Communication: daughter at bedside Disposition Plan: working with PT- follow Consultants:   NS  ENT Procedures:    Antimicrobials:  Anti-infectives (From admission, onward)   None       Objective: Vitals:   06/12/17 1244 06/12/17 1300 06/12/17 1400 06/12/17 1500  BP: (!) 164/59     Pulse: 66 73 75 70  Resp: 20 15 16  (!) 24  Temp: 97.8 F (36.6 C)     TempSrc: Oral     SpO2: 99% 100% 100% 92%  Weight:      Height:        Intake/Output Summary (Last 24 hours) at 06/12/2017 1544 Last data filed at 06/12/2017 1500 Gross per 24 hour  Intake 510 ml  Output 1400 ml  Net -890 ml   Filed Weights   06/09/17 0350 06/10/17 0543 06/11/17 0500  Weight: 53.8 kg (118 lb 9.7 oz) 53.8 kg (118 lb 9.7 oz) 115.7 kg (255 lb 1.2 oz)    Examination: General exam: Appears comfortable  HEENT: PERRLA, oral mucosa moist, no sclera icterus or thrush Respiratory system: Clear to auscultation. Respiratory effort normal. Cardiovascular system: S1 & S2 heard, RRR.  No murmurs - pulse ox 94% on 2 L - need  to remind her to breath through her nose Gastrointestinal system: Abdomen soft, non-tender, nondistended. Normal bowel sound. No organomegaly Central nervous system: Alert - confused- No focal neurological deficits. Extremities: No cyanosis, clubbing or edema Skin: No rashes or ulcers Psychiatry:  Mood & affect appropriate.    Data Reviewed: I have personally reviewed following labs and imaging studies  CBC: Recent Labs  Lab 06/08/17 0741 06/09/17 0511 06/10/17 0454 06/11/17 0805 06/12/17 0621  WBC 15.2* 16.4* 16.5* 13.4* 14.9*  NEUTROABS 13.8*  --  13.9*  --   --   HGB 13.1 11.7* 12.4 12.2 13.7  HCT 38.2 36.5 37.7 37.4 41.9  MCV 90.1 94.1 95.4 94.0 93.9  PLT 267 215 212 237 132   Basic  Metabolic Panel: Recent Labs  Lab 06/08/17 0741 06/09/17 0511 06/10/17 0454 06/11/17 0805 06/12/17 0621  NA 136 136 141 137 134*  K 3.1* 3.2* 3.7 3.7 4.1  CL 102 103 108 98* 95*  CO2 24 23 24 29 29   GLUCOSE 185* 124* 131* 121* 129*  BUN 24* 23* 23* 23* 26*  CREATININE 0.81 0.72 0.77 0.69 0.74  CALCIUM 9.1 7.9* 8.7* 9.0 9.1   GFR: CrCl cannot be calculated (Unknown ideal weight.). Liver Function Tests: Recent Labs  Lab 06/08/17 0741 06/09/17 0511 06/10/17 0454  AST 35 24 22  ALT 22 18 16   ALKPHOS 108 83 87  BILITOT 0.9 1.0 0.7  PROT 6.0* 5.2* 5.5*  ALBUMIN 3.4* 2.7* 2.7*   No results for input(s): LIPASE, AMYLASE in the last 168 hours. No results for input(s): AMMONIA in the last 168 hours. Coagulation Profile: Recent Labs  Lab 06/09/17 0511  INR 1.13   Cardiac Enzymes: Recent Labs  Lab 06/08/17 0741  CKTOTAL 95   BNP (last 3 results) No results for input(s): PROBNP in the last 8760 hours. HbA1C: No results for input(s): HGBA1C in the last 72 hours. CBG: Recent Labs  Lab 06/09/17 0807 06/09/17 1842 06/10/17 0722  GLUCAP 115* 201* 129*   Lipid Profile: No results for input(s): CHOL, HDL, LDLCALC, TRIG, CHOLHDL, LDLDIRECT in the last 72 hours. Thyroid Function Tests: No results for input(s): TSH, T4TOTAL, FREET4, T3FREE, THYROIDAB in the last 72 hours. Anemia Panel: No results for input(s): VITAMINB12, FOLATE, FERRITIN, TIBC, IRON, RETICCTPCT in the last 72 hours. Urine analysis:    Component Value Date/Time   COLORURINE YELLOW 06/08/2017 0732   APPEARANCEUR CLEAR 06/08/2017 0732   LABSPEC 1.018 06/08/2017 0732   PHURINE 6.0 06/08/2017 0732   GLUCOSEU NEGATIVE 06/08/2017 0732   HGBUR NEGATIVE 06/08/2017 0732   BILIRUBINUR NEGATIVE 06/08/2017 0732   KETONESUR 5 (A) 06/08/2017 0732   PROTEINUR NEGATIVE 06/08/2017 0732   UROBILINOGEN 0.2 11/23/2013 2308   NITRITE NEGATIVE 06/08/2017 0732   LEUKOCYTESUR NEGATIVE 06/08/2017 0732   Sepsis  Labs: @LABRCNTIP (procalcitonin:4,lacticidven:4) ) Recent Results (from the past 240 hour(s))  Blood culture (routine x 2)     Status: None (Preliminary result)   Collection Time: 06/08/17  7:46 AM  Result Value Ref Range Status   Specimen Description BLOOD RIGHT ANTECUBITAL  Final   Special Requests   Final    BOTTLES DRAWN AEROBIC AND ANAEROBIC Blood Culture adequate volume   Culture NO GROWTH 4 DAYS  Final   Report Status PENDING  Incomplete  Blood culture (routine x 2)     Status: None (Preliminary result)   Collection Time: 06/08/17  7:53 AM  Result Value Ref Range Status   Specimen Description BLOOD LEFT ANTECUBITAL  Final  Special Requests   Final    BOTTLES DRAWN AEROBIC AND ANAEROBIC Blood Culture adequate volume   Culture NO GROWTH 4 DAYS  Final   Report Status PENDING  Incomplete  MRSA PCR Screening     Status: None   Collection Time: 06/08/17  5:30 PM  Result Value Ref Range Status   MRSA by PCR NEGATIVE NEGATIVE Final    Comment:        The GeneXpert MRSA Assay (FDA approved for NASAL specimens only), is one component of a comprehensive MRSA colonization surveillance program. It is not intended to diagnose MRSA infection nor to guide or monitor treatment for MRSA infections.          Radiology Studies: Dg Chest 2 View  Result Date: 06/10/2017 CLINICAL DATA:  84 year old female with weakness, dyspnea and hypoxia. EXAM: CHEST  2 VIEW COMPARISON:  06/08/2017 FINDINGS: The heart size and mediastinal contours are within normal limits. Interval development of vascular congestion and perihilar airspace opacities with small bilateral pleural effusions, left greater than right consistent with CHF. Moderate aortic atherosclerosis without aneurysmal dilatation. No acute osseous abnormality. IMPRESSION: Vascular congestion with perihilar airspace opacities and small bilateral pleural effusions compatible with mild CHF. Superimposed pneumonia would be difficult to  entirely exclude. Electronically Signed   By: Ashley Royalty M.D.   On: 06/10/2017 13:36   Ct Head Wo Contrast  Result Date: 06/09/2017 CLINICAL DATA:  Subdural hematoma.  Facial fractures. EXAM: CT HEAD WITHOUT CONTRAST TECHNIQUE: Contiguous axial images were obtained from the base of the skull through the vertex without intravenous contrast. COMPARISON:  CT of the head and face 06/08/2017. FINDINGS: Brain: Left frontal subdural and subarachnoid hemorrhage is again noted. Hypoattenuating cortex compatible with contusion is more prominent on today's study. The frontal extra-axial hemorrhages not significantly changed. Blood products are again noted along the falx both anteriorly and posteriorly. Bladder is layering along the tentorium, asymmetric on the left. Subarachnoid hemorrhage about the cerebellum has increased. Intraventricular hemorrhage is more prominent on today's study with blood layering in the posterior horns of both lateral ventricles. Minimal blood is seen along the inferior fourth ventricle. There is no hydrocephalus. The brainstem is unremarkable. 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 Vascular: Vascular calcifications are again seen. There is no hyperdense vessel. Skull: Right occipital skull fracture is stable. Left zygomatic arch and temporal bone fractures are stable. Skullbase fractures cross in the left sphenoid wing and sphenoid sinuses are again noted with hemorrhage in the sphenoid sinus bilaterally. Left medial orbital wall fractures are present. No new fractures are present. Sinuses/Orbits: Blood layers in the sphenoid sinuses bilaterally. There is some fluid in posterior left ethmoid air cells, likely also blood. Maxillary sinuses and frontal sinuses are clear. The mastoid air cells are clear. Bilateral lens replacements are present. Globes and orbits are otherwise within normal limits. Of note, the left sphenoid fracture crosses the orbital apex. IMPRESSION: 1.  Similar appearance of subdural and subarachnoid hemorrhage anteriorly about the left frontal lobe. 2. Diffuse subdural blood layers along the falx and left greater than right tentorium. 3. Intraventricular hemorrhage is more prominent on today's study without hydrocephalus. 4. Subarachnoid hemorrhage is now evident about the cerebellum. 5. Progressive edema associated with left frontal contusions. 6. Multiple skull and facial fractures again demonstrated. Left sphenoid wing fracture crosses the orbital apex in could involve the left optic nerve. Electronically Signed   By: San Morelle M.D.   On: 06/09/2017 07:30  Scheduled Meds: . atorvastatin  20 mg Oral q1800  . calcium-vitamin D  1 tablet Oral BID WC  . cholecalciferol  1,000 Units Oral q1800  . donepezil  5 mg Oral QHS  . levETIRAcetam  500 mg Oral BID  . mouth rinse  15 mL Mouth Rinse BID  . neomycin-bacitracin-polymyxin   Topical BID  . pantoprazole  40 mg Oral Daily  . polyethylene glycol  17 g Oral Daily  . potassium chloride  40 mEq Oral Daily  . QUEtiapine  25 mg Oral QHS  . triamterene-hydrochlorothiazide  1 tablet Oral Daily  . verapamil  240 mg Oral Daily   Continuous Infusions:    LOS: 4 days    Time spent in minutes: [place], MD [facility] Pager: [email or web address] Password [facility] 06/12/2017, 3:44 PM

## 2017-06-12 NOTE — Progress Notes (Signed)
Pt's heart rate jumped into the 140's very suddenly at approximately 2000. When this nurse went into the room to assess the pt, the pt was observed curled in a ball with chattering teeth, moaning and mumbling incoherently. A rectal temp was taken, the pt's temp was 101. The pt was given a tylenol suppository to lower temp. At approximately 2030, the pt was witness by another nurse vomiting. The pt was cleaned up and given zofran IV. A repeat rectal temp at 2200 showed an increase in the pt's temp to 103.4. This nurse paged the on call provider. Ibuprofen IVBP and a 500 mL bolus have been ordered. This nurse will administer the ordered medications and will continue to monitor the pt closely.

## 2017-06-12 NOTE — Progress Notes (Signed)
Pt bed needs to be zeroed in order to get an accurate weight, pt will need to use lift in order to do this, I will pass this along to day nurse.

## 2017-06-13 DIAGNOSIS — R509 Fever, unspecified: Secondary | ICD-10-CM

## 2017-06-13 LAB — BLOOD CULTURE ID PANEL (REFLEXED)

## 2017-06-13 LAB — BASIC METABOLIC PANEL
ANION GAP: 9 (ref 5–15)
BUN: 31 mg/dL — ABNORMAL HIGH (ref 6–20)
CO2: 29 mmol/L (ref 22–32)
Calcium: 8.8 mg/dL — ABNORMAL LOW (ref 8.9–10.3)
Chloride: 100 mmol/L — ABNORMAL LOW (ref 101–111)
Creatinine, Ser: 0.89 mg/dL (ref 0.44–1.00)
GFR calc non Af Amer: 55 mL/min — ABNORMAL LOW (ref >60)
Glucose, Bld: 161 mg/dL — ABNORMAL HIGH (ref 65–99)
Potassium: 3.8 mmol/L (ref 3.5–5.1)
Sodium: 138 mmol/L (ref 135–145)

## 2017-06-13 LAB — CULTURE, BLOOD (ROUTINE X 2)
CULTURE: NO GROWTH
Culture: NO GROWTH
Special Requests: ADEQUATE
Special Requests: ADEQUATE

## 2017-06-13 LAB — CBC
HCT: 37.2 % (ref 36.0–46.0)
HEMOGLOBIN: 11.6 g/dL — AB (ref 12.0–15.0)
MCH: 29.1 pg (ref 26.0–34.0)
MCHC: 31.2 g/dL (ref 30.0–36.0)
MCV: 93.2 fL (ref 78.0–100.0)
Platelets: 205 10*3/uL (ref 150–400)
RBC: 3.99 MIL/uL (ref 3.87–5.11)
RDW: 13.3 % (ref 11.5–15.5)
WBC: 26.4 10*3/uL — AB (ref 4.0–10.5)

## 2017-06-13 MED ORDER — DEXTROSE 5 % IV SOLN
1.0000 g | INTRAVENOUS | Status: DC
  Administered 2017-06-13 – 2017-06-14 (×2): 1 g via INTRAVENOUS
  Filled 2017-06-13 (×3): qty 1

## 2017-06-13 NOTE — Plan of Care (Signed)
Pt had a sudden spike in temp with associated ST and tachypnea. Pt had one incident of emesis as well.

## 2017-06-13 NOTE — Progress Notes (Signed)
Physical Therapy Treatment Patient Details Name: Lori Boone MRN: [id number] DOB: 1926-01-08 Today's Date: 06/13/2017    History of Present Illness 68 fem with HTN, simple ovarian cysts stable since 2017, prior near syncope 2013 with no cause, progressive dementia over last year, reflux.  Pt sustained probable mech fall resulting in SDH and Subarachnoid hemorrhage, fractures of face and parietal and occipital bones.     PT Comments    Pt admitted with above diagnosis. Pt currently with functional limitations due to balance and endurance deficits. Pt was able to ambulate on unit with RW with min assist of 1 and +1 for safety.  Pt incontinent and has to wear Depends while walking.  Able to progress ambulation today.  Cognitively impaired.  Will continue acute PT.  Pt will benefit from skilled PT to increase their independence and safety with mobility to allow discharge to the venue listed below.     Follow Up Recommendations  SNF;Supervision/Assistance - 24 hour     Equipment Recommendations  Other (comment)(TBA)    Recommendations for Other Services       Precautions / Restrictions Precautions Precautions: Fall Precaution Comments: soft collar orders discontinued 1/1 Restrictions Weight Bearing Restrictions: No    Mobility  Bed Mobility Overal bed mobility: Needs Assistance Bed Mobility: Supine to Sit Rolling: Max assist;+2 for physical assistance Sidelying to sit: Max assist;+2 for physical assistance       General bed mobility comments: multimodal cues for sequencing, use of bed pad to assist hips EOB, assist for trunk elevation  Transfers Overall transfer level: Needs assistance Equipment used: Rolling walker (2 wheeled) Transfers: Sit to/from [facility] Sit to Stand: Mod assist;From elevated surface;+2 safety/equipment;Min assist         General transfer comment: Placed Depends on on arrival so that pt could ambulate.  Noted pt began urinating  upon standing but was able to pull up Depends and initiate walk.    Ambulation/Gait Ambulation/Gait assistance: Min assist;+2 safety/equipment Ambulation Distance (Feet): 280 Feet Assistive device: Rolling walker (2 wheeled) Gait Pattern/deviations: Step-through pattern;Decreased stride length;Drifts right/left;Trunk flexed;Wide base of support;Staggering left;Staggering right;Shuffle   Gait velocity interpretation: Below normal speed for age/gender General Gait Details: Pt was able to ambulate with RW with min assist with cues for sequencing steps and RW.  Pt was unsteady at times and followed pt with chair but pt did not have to sit down.  Pt Needs postural cues as well. Upon return had pt sit on 3N1 and Depends was full of urine and BM on return.  Pt also urinated and had BM in toilet as well and was cleaned prior to transfer to recliner.  Left in recliner with daughter in room.   Stairs            Wheelchair Mobility    Modified Rankin (Stroke Patients Only)       Balance Overall balance assessment: Needs assistance Sitting-balance support: No upper extremity supported;Feet supported Sitting balance-Leahy Scale: Good Sitting balance - Comments: able to sit min guard assist at EOB and on BSC.    Standing balance support: Bilateral upper extremity supported;During functional activity Standing balance-Leahy Scale: Poor Standing balance comment: relies on BUE external support with use of RW                            Cognition Arousal/Alertness: Awake/alert Behavior During Therapy: Flat affect Overall Cognitive Status: Impaired/Different from baseline Area of Impairment: Following  commands;Awareness;Safety/judgement                     Memory: Decreased short-term memory;Decreased recall of precautions Following Commands: Follows one step commands with increased time Safety/Judgement: Decreased awareness of safety;Decreased awareness of  deficits Awareness: Emergent Problem Solving: Slow processing;Decreased initiation;Difficulty sequencing;Requires verbal cues;Requires tactile cues General Comments: daughter says pt has been on dementia med for over a year and it has worsened to the point she cannot stay alone any more.       Exercises General Exercises - Lower Extremity Ankle Circles/Pumps: AROM;Both;10 reps;Seated Long Arc Quad: AROM;Both;10 reps;Seated    General Comments General comments (skin integrity, edema, etc.): Daughter in room.  Ambulated on 10LHFNC to keep sats >90%.  Put back on 8LHFNC at end of session.       Pertinent Vitals/Pain Faces Pain Scale: No hurt  Pt on 10LHFNC with activity to keep sats >90%.  On 8LHFNC at rest.   Home Living                      Prior Function            PT Goals (current goals can now be found in the care plan section) Acute Rehab PT Goals Patient Stated Goal: get her to SNF Progress towards PT goals: Progressing toward goals    Frequency    Min 3X/week      PT Plan Current plan remains appropriate    Co-evaluation              AM-PAC PT "6 Clicks" Daily Activity  Outcome Measure  Difficulty turning over in bed (including adjusting bedclothes, sheets and blankets)?: Unable Difficulty moving from lying on back to sitting on the side of the bed? : Unable Difficulty sitting down on and standing up from a chair with arms (e.g., wheelchair, bedside commode, etc,.)?: Unable Help needed moving to and from a bed to chair (including a wheelchair)?: A Lot Help needed walking in hospital room?: A Little Help needed climbing 3-5 steps with a railing? : Total 6 Click Score: 9    End of Session Equipment Utilized During Treatment: Gait belt;Oxygen Activity Tolerance: Patient limited by fatigue Patient left: in chair;with call bell/phone within reach;with chair alarm set;with family/visitor present Nurse Communication: Mobility status PT Visit  Diagnosis: Unsteadiness on feet (R26.81);Muscle weakness (generalized) (M62.81)     Time: 8756-4332 PT Time Calculation (min) (ACUTE ONLY): 23 min  Charges:  $Gait Training: 23-37 mins                    G Codes:       Lori Boone,PT Acute Rehabilitation [phone] (pager)    Lori Boone 06/13/2017, 10:26 AM

## 2017-06-13 NOTE — Progress Notes (Signed)
Htn, syncope, acute encephalopathy with mild dementia, s/p fall with sdh and sah, multiple skull and facial fx's, fluid overload, leukocytosis, 7 liters weaned to 2 liters, plan for SNF.

## 2017-06-13 NOTE — Progress Notes (Signed)
Pharmacy Antibiotic Note  Lori Boone is a 84 y.o. female admitted on 06/08/2017. Found to have SDH / subarachnoid bleed with facial fractures. Now has FUO. Pharmacy has been consulted for cefepime dosing.   Fever could be from cranial bleed and trauma. WBC could also be related to trauma but will start abx for now in case of underlying infection.  Plan: Start cefepime 1g IV Q24h Monitor clinical picture, renal function F/U C&S, abx deescalation / LOT   Height: (!) 5" (12.7 cm) Weight: 110 lb 14.3 oz (50.3 kg) IBW/kg (Calculated) : -81  Temp (24hrs), Avg:99.7 F (37.6 C), Min:95.1 F (35.1 C), Max:103.4 F (39.7 C)  Recent Labs  Lab 06/08/17 0801 06/09/17 0511 06/10/17 0454 06/11/17 0805 06/12/17 0621 06/13/17 0351  WBC  --  16.4* 16.5* 13.4* 14.9* 26.4*  CREATININE  --  0.72 0.77 0.69 0.74 0.89  LATICACIDVEN 1.56  --   --   --   --   --     CrCl cannot be calculated (Unknown ideal weight.).    Allergies  Allergen Reactions  . Codeine Nausea Only  . Penicillins     Makes patient not feel well all over    Thank you for allowing pharmacy to be a part of this patient's care.  Reginia Naas 06/13/2017 11:40 AM

## 2017-06-13 NOTE — Progress Notes (Signed)
PHARMACY - PHYSICIAN COMMUNICATION CRITICAL VALUE ALERT - BLOOD CULTURE IDENTIFICATION (BCID)  Lori Boone is an 84 y.o. female who presented to [facility] on 06/08/2017 with a chief complaint of fall, now with fever.   Assessment:  Enterobacter cloacae complex (carbapenem resistance not detected (include suspected source if known) on BCID and Blood cultures 2/2 for GNR.   Name of physician (or Provider) Contacted: Dr. Wendee Beavers  Current antibiotics: Cefepime 1 gm IV every 24 hours  Estimated CrCl ~ 40 mL/min  Changes to prescribed antibiotics recommended:  Patient is on recommended antibiotics - No changes needed  Results for orders placed or performed during the hospital encounter of 06/08/17  Blood Culture ID Panel (Reflexed) (Collected: 06/12/2017 11:43 PM)  Result Value Ref Range   Enterococcus species NOT DETECTED NOT DETECTED   Listeria monocytogenes NOT DETECTED NOT DETECTED   Staphylococcus species NOT DETECTED NOT DETECTED   Staphylococcus aureus NOT DETECTED NOT DETECTED   Streptococcus species NOT DETECTED NOT DETECTED   Streptococcus agalactiae NOT DETECTED NOT DETECTED   Streptococcus pneumoniae NOT DETECTED NOT DETECTED   Streptococcus pyogenes NOT DETECTED NOT DETECTED   Acinetobacter baumannii NOT DETECTED NOT DETECTED   Enterobacteriaceae species DETECTED (A) NOT DETECTED   Enterobacter cloacae complex DETECTED (A) NOT DETECTED   Escherichia coli NOT DETECTED NOT DETECTED   Klebsiella oxytoca NOT DETECTED NOT DETECTED   Klebsiella pneumoniae NOT DETECTED NOT DETECTED   Proteus species NOT DETECTED NOT DETECTED   Serratia marcescens NOT DETECTED NOT DETECTED   Carbapenem resistance NOT DETECTED NOT DETECTED   Haemophilus influenzae NOT DETECTED NOT DETECTED   Neisseria meningitidis NOT DETECTED NOT DETECTED   Pseudomonas aeruginosa NOT DETECTED NOT DETECTED   Candida albicans NOT DETECTED NOT DETECTED   Candida glabrata NOT DETECTED NOT DETECTED   Candida krusei  NOT DETECTED NOT DETECTED   Candida parapsilosis NOT DETECTED NOT DETECTED   Candida tropicalis NOT DETECTED NOT DETECTED    [name] 06/13/2017  6:19 PM

## 2017-06-13 NOTE — Progress Notes (Signed)
CSW following for discharge to SNF when stable for DC  Planning for DC to [facility] as long as patient is stable  Transport set for 11am- MD aware   CSW will touch base with MD in am to confirm pt is still stable for DC.  Jorge Ny, [place] Worker [phone]

## 2017-06-13 NOTE — Care Management Important Message (Signed)
Important Message  Patient Details  Name: Lori Boone MRN: [id number] Date of Birth: 11/15/1925   Medicare Important Message Given:  Yes    Katrena Stehlin Abena 06/13/2017, 9:35 AM

## 2017-06-13 NOTE — Progress Notes (Signed)
PROGRESS NOTE    Lori Boone   OZH:[id number]  DOB: [birth year]  DOA: 06/08/2017 PCP: Burnard Bunting, MD   Brief Narrative:  Lori Boone with HTN, syncope,mild dementia, GERD admitted for a fall and found to have a subdural and subarachnoid hemorrhage, multiple skull and facial fractures admitted to [facility] as the ER doctor thought she was comfort care.    Subjective: Spoke with daughter. Currently concerned about spike in fever  Assessment & Plan:   Active Problems:   SDH/  Subarachnoid bleed - conservative management- NS has signed off   FUO - will  Obtain urine culture and analysis, chest x ray negative for sourse. Also had elevation in WBC levels - will cover with zosyn currently with low threshold to discontinue pending course.  Facial fractures - "Right occipital skull fracture is stable. Left zygomatic arch and temporal bone fractures are stable. Skullbase fractures cross in the left sphenoid wing and sphenoid sinuses are again noted with hemorrhage in the sphenoid sinus bilaterally. Left medial orbital wall fractures are present. " - per ENT, fracture should heal without intervention- he recommends f/u as needed  Acute encephalopathy - underlying dementia- new bleed may be affecting mentation- she remains oriented only to self - Pt has had sundowning before. Stable per my discussion with daughter.  Hypoxia - pulse ox in high 80s - 1/1-  CXR suggestive of fluid overload- given 1 dose of 40 meq of IV Lasix and had > 1 L urine output - ECHO does not reveal any significant heart failure - see report below- no need to continue Lasix- lung clear on exam today-  - continue incentive spirometry- currently only needing 2 L of O2 and I suspect that this is due to atelectasis- she is a mouth breather as well - will need to go to SNF with O2     Hyperlipidemia - on Statin at home    GERD (gastroesophageal reflux disease) -   PPI    Hypertension -  on Maxzide and Verapamil  DVT prophylaxis: SCDs Code Status: DNR Family Communication: daughter at bedside Disposition Plan: working with PT- follow Consultants:   NS  ENT Procedures:    Antimicrobials:  Anti-infectives (From admission, onward)   Start     Dose/Rate Route Frequency Ordered Stop   06/13/17 1230  ceFEPIme (MAXIPIME) 1 g in dextrose 5 % 50 mL IVPB     1 g 100 mL/hr over 30 Minutes Intravenous Every 24 hours 06/13/17 1144         Objective: Vitals:   06/13/17 1000 06/13/17 1100 06/13/17 1200 06/13/17 1230  BP:   135/61   Pulse: 84 78 73 79  Resp: (!) 22 16 20 17   Temp:   97.9 F (36.6 C)   TempSrc:   Axillary   SpO2: 97% 96% 98% 95%  Weight:      Height:        Intake/Output Summary (Last 24 hours) at 06/13/2017 1331 Last data filed at 06/13/2017 0145 Gross per 24 hour  Intake 820 ml  Output -  Net 820 ml   Filed Weights   06/11/17 0500 06/12/17 1700 06/13/17 0442  Weight: 115.7 kg (255 lb 1.2 oz) 48.4 kg (106 lb 11.2 oz) 50.3 kg (110 lb 14.3 oz)    Examination: General exam: Appears comfortable, in nad. HEENT: PERRLA, oral mucosa moist, no sclera icterus or thrush Respiratory system: Clear to auscultation. Respiratory effort normal. Cardiovascular system: S1 & S2 heard, RRR.  No murmurs  Gastrointestinal system: Abdomen soft, non-tender, nondistended. Normal bowel sound. No organomegaly Central nervous system: Alert - confused- No focal neurological deficits. Extremities: No cyanosis, clubbing or edema Skin: No rashes or ulcers Psychiatry:  Mood & affect appropriate.    Data Reviewed: I have personally reviewed following labs and imaging studies  CBC: Recent Labs  Lab 06/08/17 0741 06/09/17 0511 06/10/17 0454 06/11/17 0805 06/12/17 0621 06/13/17 0351  WBC 15.2* 16.4* 16.5* 13.4* 14.9* 26.4*  NEUTROABS 13.8*  --  13.9*  --   --   --   HGB 13.1 11.7* 12.4 12.2 13.7 11.6*  HCT 38.2 36.5 37.7 37.4 41.9 37.2  MCV 90.1 94.1 95.4 94.0  93.9 93.2  PLT 267 215 212 237 237 469   Basic Metabolic Panel: Recent Labs  Lab 06/09/17 0511 06/10/17 0454 06/11/17 0805 06/12/17 0621 06/13/17 0351  NA 136 141 137 134* 138  K 3.2* 3.7 3.7 4.1 3.8  CL 103 108 98* 95* 100*  CO2 23 24 29 29 29   GLUCOSE 124* 131* 121* 129* 161*  BUN 23* 23* 23* 26* 31*  CREATININE 0.72 0.77 0.69 0.74 0.89  CALCIUM 7.9* 8.7* 9.0 9.1 8.8*   GFR: CrCl cannot be calculated (Unknown ideal weight.). Liver Function Tests: Recent Labs  Lab 06/08/17 0741 06/09/17 0511 06/10/17 0454  AST 35 24 22  ALT 22 18 16   ALKPHOS 108 83 87  BILITOT 0.9 1.0 0.7  PROT 6.0* 5.2* 5.5*  ALBUMIN 3.4* 2.7* 2.7*   No results for input(s): LIPASE, AMYLASE in the last 168 hours. No results for input(s): AMMONIA in the last 168 hours. Coagulation Profile: Recent Labs  Lab 06/09/17 0511  INR 1.13   Cardiac Enzymes: Recent Labs  Lab 06/08/17 0741  CKTOTAL 95   BNP (last 3 results) No results for input(s): PROBNP in the last 8760 hours. HbA1C: No results for input(s): HGBA1C in the last 72 hours. CBG: Recent Labs  Lab 06/09/17 0807 06/09/17 1842 06/10/17 0722  GLUCAP 115* 201* 129*   Lipid Profile: No results for input(s): CHOL, HDL, LDLCALC, TRIG, CHOLHDL, LDLDIRECT in the last 72 hours. Thyroid Function Tests: No results for input(s): TSH, T4TOTAL, FREET4, T3FREE, THYROIDAB in the last 72 hours. Anemia Panel: No results for input(s): VITAMINB12, FOLATE, FERRITIN, TIBC, IRON, RETICCTPCT in the last 72 hours. Urine analysis:    Component Value Date/Time   COLORURINE YELLOW 06/08/2017 0732   APPEARANCEUR CLEAR 06/08/2017 0732   LABSPEC 1.018 06/08/2017 0732   PHURINE 6.0 06/08/2017 0732   GLUCOSEU NEGATIVE 06/08/2017 0732   HGBUR NEGATIVE 06/08/2017 0732   BILIRUBINUR NEGATIVE 06/08/2017 0732   KETONESUR 5 (A) 06/08/2017 0732   PROTEINUR NEGATIVE 06/08/2017 0732   UROBILINOGEN 0.2 11/23/2013 2308   NITRITE NEGATIVE 06/08/2017 0732    LEUKOCYTESUR NEGATIVE 06/08/2017 0732   Sepsis Labs: @LABRCNTIP (procalcitonin:4,lacticidven:4) ) Recent Results (from the past 240 hour(s))  Blood culture (routine x 2)     Status: None   Collection Time: 06/08/17  7:46 AM  Result Value Ref Range Status   Specimen Description BLOOD RIGHT ANTECUBITAL  Final   Special Requests   Final    BOTTLES DRAWN AEROBIC AND ANAEROBIC Blood Culture adequate volume   Culture NO GROWTH 5 DAYS  Final   Report Status 06/13/2017 FINAL  Final  Blood culture (routine x 2)     Status: None   Collection Time: 06/08/17  7:53 AM  Result Value Ref Range Status   Specimen Description BLOOD LEFT ANTECUBITAL  Final   Special Requests   Final    BOTTLES DRAWN AEROBIC AND ANAEROBIC Blood Culture adequate volume   Culture NO GROWTH 5 DAYS  Final   Report Status 06/13/2017 FINAL  Final  MRSA PCR Screening     Status: None   Collection Time: 06/08/17  5:30 PM  Result Value Ref Range Status   MRSA by PCR NEGATIVE NEGATIVE Final    Comment:        The GeneXpert MRSA Assay (FDA approved for NASAL specimens only), is one component of a comprehensive MRSA colonization surveillance program. It is not intended to diagnose MRSA infection nor to guide or monitor treatment for MRSA infections.          Radiology Studies: Dg Chest 2 View  Result Date: 06/10/2017 CLINICAL DATA:  84 year old female with weakness, dyspnea and hypoxia. EXAM: CHEST  2 VIEW COMPARISON:  06/08/2017 FINDINGS: The heart size and mediastinal contours are within normal limits. Interval development of vascular congestion and perihilar airspace opacities with small bilateral pleural effusions, left greater than right consistent with CHF. Moderate aortic atherosclerosis without aneurysmal dilatation. No acute osseous abnormality. IMPRESSION: Vascular congestion with perihilar airspace opacities and small bilateral pleural effusions compatible with mild CHF. Superimposed pneumonia would be  difficult to entirely exclude. Electronically Signed   By: Ashley Royalty M.D.   On: 06/10/2017 13:36   Ct Head Wo Contrast  Result Date: 06/09/2017 CLINICAL DATA:  Subdural hematoma.  Facial fractures. EXAM: CT HEAD WITHOUT CONTRAST TECHNIQUE: Contiguous axial images were obtained from the base of the skull through the vertex without intravenous contrast. COMPARISON:  CT of the head and face 06/08/2017. FINDINGS: Brain: Left frontal subdural and subarachnoid hemorrhage is again noted. Hypoattenuating cortex compatible with contusion is more prominent on today's study. The frontal extra-axial hemorrhages not significantly changed. Blood products are again noted along the falx both anteriorly and posteriorly. Bladder is layering along the tentorium, asymmetric on the left. Subarachnoid hemorrhage about the cerebellum has increased. Intraventricular hemorrhage is more prominent on today's study with blood layering in the posterior horns of both lateral ventricles. Minimal blood is seen along the inferior fourth ventricle. There is no hydrocephalus. The brainstem is unremarkable. 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 Vascular: Vascular calcifications are again seen. There is no hyperdense vessel. Skull: Right occipital skull fracture is stable. Left zygomatic arch and temporal bone fractures are stable. Skullbase fractures cross in the left sphenoid wing and sphenoid sinuses are again noted with hemorrhage in the sphenoid sinus bilaterally. Left medial orbital wall fractures are present. No new fractures are present. Sinuses/Orbits: Blood layers in the sphenoid sinuses bilaterally. There is some fluid in posterior left ethmoid air cells, likely also blood. Maxillary sinuses and frontal sinuses are clear. The mastoid air cells are clear. Bilateral lens replacements are present. Globes and orbits are otherwise within normal limits. Of note, the left sphenoid fracture crosses the orbital apex.  IMPRESSION: 1. Similar appearance of subdural and subarachnoid hemorrhage anteriorly about the left frontal lobe. 2. Diffuse subdural blood layers along the falx and left greater than right tentorium. 3. Intraventricular hemorrhage is more prominent on today's study without hydrocephalus. 4. Subarachnoid hemorrhage is now evident about the cerebellum. 5. Progressive edema associated with left frontal contusions. 6. Multiple skull and facial fractures again demonstrated. Left sphenoid wing fracture crosses the orbital apex in could involve the left optic nerve. Electronically Signed   By: San Morelle M.D.   On: 06/09/2017 07:30  Scheduled Meds: . atorvastatin  20 mg Oral q1800  . calcium-vitamin D  1 tablet Oral BID WC  . cholecalciferol  1,000 Units Oral q1800  . donepezil  5 mg Oral QHS  . levETIRAcetam  500 mg Oral BID  . mouth rinse  15 mL Mouth Rinse BID  . neomycin-bacitracin-polymyxin   Topical BID  . pantoprazole  40 mg Oral Daily  . polyethylene glycol  17 g Oral Daily  . potassium chloride  40 mEq Oral Daily  . QUEtiapine  25 mg Oral QHS  . triamterene-hydrochlorothiazide  1 tablet Oral Daily  . verapamil  240 mg Oral Daily   Continuous Infusions: . ceFEPime (MAXIPIME) IV       LOS: 5 days    Time spent in minutes: 36 minutes  Velvet Bathe, MD [facility] Pager: [email or web address] Password [email or web address] 06/13/2017, 1:31 PM

## 2017-06-14 DIAGNOSIS — R7881 Bacteremia: Secondary | ICD-10-CM

## 2017-06-14 LAB — CBC
HCT: 38.9 % (ref 36.0–46.0)
HEMATOCRIT: 36.4 % (ref 36.0–46.0)
Hemoglobin: 11.5 g/dL — ABNORMAL LOW (ref 12.0–15.0)
Hemoglobin: 12.4 g/dL (ref 12.0–15.0)
MCH: 29.9 pg (ref 26.0–34.0)
MCH: 30.1 pg (ref 26.0–34.0)
MCHC: 31.6 g/dL (ref 30.0–36.0)
MCHC: 31.9 g/dL (ref 30.0–36.0)
MCV: 94.4 fL (ref 78.0–100.0)
MCV: 94.8 fL (ref 78.0–100.0)
Platelets: 215 10*3/uL (ref 150–400)
Platelets: 240 10*3/uL (ref 150–400)
RBC: 3.84 MIL/uL — ABNORMAL LOW (ref 3.87–5.11)
RBC: 4.12 MIL/uL (ref 3.87–5.11)
RDW: 13.3 % (ref 11.5–15.5)
RDW: 13.8 % (ref 11.5–15.5)
WBC: 28.8 10*3/uL — ABNORMAL HIGH (ref 4.0–10.5)
WBC: 30.9 10*3/uL — ABNORMAL HIGH (ref 4.0–10.5)

## 2017-06-14 NOTE — Progress Notes (Signed)
PROGRESS NOTE    Lori Boone   IHK:[id number]  DOB: 1925/09/26  DOA: 06/08/2017 PCP: Burnard Bunting, MD   Brief Narrative:  Lori Boone with HTN, syncope,mild dementia, GERD admitted for a fall and found to have a subdural and subarachnoid hemorrhage, multiple skull and facial fractures admitted to [facility] as the ER doctor thought she was comfort care.    Subjective: Pt and daughter updated. Pt has gram negative bacteremia  Assessment & Plan:   Active Problems:   SDH/  Subarachnoid bleed - conservative management- NS has signed off   Gram negative bacteremia - Continue IV antibiotics until susceptibilities available. We'll plan on narrowing antibiotics once susceptibility results are in - Pt will need 14 days of antibiotics total.  Facial fractures - "Right occipital skull fracture is stable. Left zygomatic arch and temporal bone fractures are stable. Skullbase fractures cross in the left sphenoid wing and sphenoid sinuses are again noted with hemorrhage in the sphenoid sinus bilaterally. Left medial orbital wall fractures are present. " - per ENT, fracture should heal without intervention- he recommends f/u as needed  Acute encephalopathy - underlying dementia- new bleed may be affecting mentation- she remains oriented only to self - Pt has had sundowning before. Stable per my discussion with daughter.  Hypoxia - pulse ox in high 80s - 1/1-  CXR suggestive of fluid overload- given 1 dose of 40 meq of IV Lasix and had > 1 L urine output - ECHO does not reveal any significant heart failure - see report below- no need to continue Lasix- lung clear on exam today-  - continue incentive spirometry- currently only needing 2 L of O2 and I suspect that this is due to atelectasis- she is a mouth breather as well - will need to go to SNF with O2     Hyperlipidemia - on Statin at home    GERD (gastroesophageal reflux disease) -   PPI    Hypertension -  on Maxzide and Verapamil  DVT prophylaxis: SCDs Code Status: DNR Family Communication: daughter at bedside Disposition Plan: Discharge on Monday Consultants:   NS  ENT Procedures:    Antimicrobials:  Anti-infectives (From admission, onward)   Start     Dose/Rate Route Frequency Ordered Stop   06/13/17 1230  ceFEPIme (MAXIPIME) 1 g in dextrose 5 % 50 mL IVPB     1 g 100 mL/hr over 30 Minutes Intravenous Every 24 hours 06/13/17 1144         Objective: Vitals:   06/14/17 0900 06/14/17 1000 06/14/17 1116 06/14/17 1200  BP:   111/80 (!) 142/62  Pulse: 72 66 66 62  Resp: 14 (!) 23 15 18   Temp:      TempSrc:      SpO2: 97% 96% 95% 96%  Weight:      Height:        Intake/Output Summary (Last 24 hours) at 06/14/2017 1712 Last data filed at 06/13/2017 2200 Gross per 24 hour  Intake 230 ml  Output -  Net 230 ml   Filed Weights   06/12/17 1700 06/13/17 0442 06/14/17 0545  Weight: 48.4 kg (106 lb 11.2 oz) 50.3 kg (110 lb 14.3 oz) 48 kg (105 lb 13.1 oz)    Examination: Exam unchanged when compared to 06/13/2017 General exam: Appears comfortable, in nad. HEENT: PERRLA, oral mucosa moist, no sclera icterus or thrush Respiratory system: Clear to auscultation. Respiratory effort normal. Cardiovascular system: S1 & S2 heard, RRR.  No  murmurs  Gastrointestinal system: Abdomen soft, non-tender, nondistended. Normal bowel sound. No organomegaly Central nervous system: Alert - confused- No focal neurological deficits. Extremities: No cyanosis, clubbing or edema Skin: No rashes or ulcers Psychiatry:  Mood & affect appropriate.    Data Reviewed: I have personally reviewed following labs and imaging studies  CBC: Recent Labs  Lab 06/08/17 0741  06/10/17 0454 06/11/17 0805 06/12/17 0621 06/13/17 0351 06/13/17 2330 06/14/17 0857  WBC 15.2*   < > 16.5* 13.4* 14.9* 26.4* 30.9* 28.8*  NEUTROABS 13.8*  --  13.9*  --   --   --   --   --   HGB 13.1   < > 12.4 12.2 13.7 11.6*  11.5* 12.4  HCT 38.2   < > 37.7 37.4 41.9 37.2 36.4 38.9  MCV 90.1   < > 95.4 94.0 93.9 93.2 94.8 94.4  PLT 267   < > 212 237 237 205 215 240   < > = values in this interval not displayed.   Basic Metabolic Panel: Recent Labs  Lab 06/09/17 0511 06/10/17 0454 06/11/17 0805 06/12/17 0621 06/13/17 0351  NA 136 141 137 134* 138  K 3.2* 3.7 3.7 4.1 3.8  CL 103 108 98* 95* 100*  CO2 23 24 29 29 29   GLUCOSE 124* 131* 121* 129* 161*  BUN 23* 23* 23* 26* 31*  CREATININE 0.72 0.77 0.69 0.74 0.89  CALCIUM 7.9* 8.7* 9.0 9.1 8.8*   GFR: CrCl cannot be calculated (Unknown ideal weight.). Liver Function Tests: Recent Labs  Lab 06/08/17 0741 06/09/17 0511 06/10/17 0454  AST 35 24 22  ALT 22 18 16   ALKPHOS 108 83 87  BILITOT 0.9 1.0 0.7  PROT 6.0* 5.2* 5.5*  ALBUMIN 3.4* 2.7* 2.7*   No results for input(s): LIPASE, AMYLASE in the last 168 hours. No results for input(s): AMMONIA in the last 168 hours. Coagulation Profile: Recent Labs  Lab 06/09/17 0511  INR 1.13   Cardiac Enzymes: Recent Labs  Lab 06/08/17 0741  CKTOTAL 95   BNP (last 3 results) No results for input(s): PROBNP in the last 8760 hours. HbA1C: No results for input(s): HGBA1C in the last 72 hours. CBG: Recent Labs  Lab 06/09/17 0807 06/09/17 1842 06/10/17 0722  GLUCAP 115* 201* 129*   Lipid Profile: No results for input(s): CHOL, HDL, LDLCALC, TRIG, CHOLHDL, LDLDIRECT in the last 72 hours. Thyroid Function Tests: No results for input(s): TSH, T4TOTAL, FREET4, T3FREE, THYROIDAB in the last 72 hours. Anemia Panel: No results for input(s): VITAMINB12, FOLATE, FERRITIN, TIBC, IRON, RETICCTPCT in the last 72 hours. Urine analysis:    Component Value Date/Time   COLORURINE YELLOW 06/08/2017 0732   APPEARANCEUR CLEAR 06/08/2017 0732   LABSPEC 1.018 06/08/2017 0732   PHURINE 6.0 06/08/2017 0732   GLUCOSEU NEGATIVE 06/08/2017 0732   HGBUR NEGATIVE 06/08/2017 0732   BILIRUBINUR NEGATIVE 06/08/2017 0732     KETONESUR 5 (A) 06/08/2017 0732   PROTEINUR NEGATIVE 06/08/2017 0732   UROBILINOGEN 0.2 11/23/2013 2308   NITRITE NEGATIVE 06/08/2017 0732   LEUKOCYTESUR NEGATIVE 06/08/2017 0732   Sepsis Labs: @LABRCNTIP (procalcitonin:4,lacticidven:4) ) Recent Results (from the past 240 hour(s))  Blood culture (routine x 2)     Status: None   Collection Time: 06/08/17  7:46 AM  Result Value Ref Range Status   Specimen Description BLOOD RIGHT ANTECUBITAL  Final   Special Requests   Final    BOTTLES DRAWN AEROBIC AND ANAEROBIC Blood Culture adequate volume   Culture NO GROWTH  5 DAYS  Final   Report Status 06/13/2017 FINAL  Final  Blood culture (routine x 2)     Status: None   Collection Time: 06/08/17  7:53 AM  Result Value Ref Range Status   Specimen Description BLOOD LEFT ANTECUBITAL  Final   Special Requests   Final    BOTTLES DRAWN AEROBIC AND ANAEROBIC Blood Culture adequate volume   Culture NO GROWTH 5 DAYS  Final   Report Status 06/13/2017 FINAL  Final  MRSA PCR Screening     Status: None   Collection Time: 06/08/17  5:30 PM  Result Value Ref Range Status   MRSA by PCR NEGATIVE NEGATIVE Final    Comment:        The GeneXpert MRSA Assay (FDA approved for NASAL specimens only), is one component of a comprehensive MRSA colonization surveillance program. It is not intended to diagnose MRSA infection nor to guide or monitor treatment for MRSA infections.   Culture, blood (routine x 2)     Status: Abnormal (Preliminary result)   Collection Time: 06/12/17 11:43 PM  Result Value Ref Range Status   Specimen Description BLOOD LEFT WRIST  Final   Special Requests IN PEDIATRIC BOTTLE Blood Culture adequate volume  Final   Culture  Setup Time   Final    GRAM NEGATIVE RODS AEROBIC BOTTLE ONLY CRITICAL RESULT CALLED TO, READ BACK BY AND VERIFIED WITH: PHARMD J MILLEN [id number] MLM    Culture ENTEROBACTER CLOACAE SUSCEPTIBILITIES TO FOLLOW  (A)  Final   Report Status PENDING   Incomplete  Blood Culture ID Panel (Reflexed)     Status: Abnormal   Collection Time: 06/12/17 11:43 PM  Result Value Ref Range Status   Enterococcus species NOT DETECTED NOT DETECTED Final   Listeria monocytogenes NOT DETECTED NOT DETECTED Final   Staphylococcus species NOT DETECTED NOT DETECTED Final   Staphylococcus aureus NOT DETECTED NOT DETECTED Final   Streptococcus species NOT DETECTED NOT DETECTED Final   Streptococcus agalactiae NOT DETECTED NOT DETECTED Final   Streptococcus pneumoniae NOT DETECTED NOT DETECTED Final   Streptococcus pyogenes NOT DETECTED NOT DETECTED Final   Acinetobacter baumannii NOT DETECTED NOT DETECTED Final   Enterobacteriaceae species DETECTED (A) NOT DETECTED Final    Comment: Enterobacteriaceae represent a large family of gram-negative bacteria, not a single organism. CRITICAL RESULT CALLED TO, READ BACK BY AND VERIFIED WITH: PHARMD J MILLEN [phone] MLM    Enterobacter cloacae complex DETECTED (A) NOT DETECTED Final    Comment: CRITICAL RESULT CALLED TO, READ BACK BY AND VERIFIED WITH: PHARMD J MILLEN [phone] MLM    Escherichia coli NOT DETECTED NOT DETECTED Final   Klebsiella oxytoca NOT DETECTED NOT DETECTED Final   Klebsiella pneumoniae NOT DETECTED NOT DETECTED Final   Proteus species NOT DETECTED NOT DETECTED Final   Serratia marcescens NOT DETECTED NOT DETECTED Final   Carbapenem resistance NOT DETECTED NOT DETECTED Final   Haemophilus influenzae NOT DETECTED NOT DETECTED Final   Neisseria meningitidis NOT DETECTED NOT DETECTED Final   Pseudomonas aeruginosa NOT DETECTED NOT DETECTED Final   Candida albicans NOT DETECTED NOT DETECTED Final   Candida glabrata NOT DETECTED NOT DETECTED Final   Candida krusei NOT DETECTED NOT DETECTED Final   Candida parapsilosis NOT DETECTED NOT DETECTED Final   Candida tropicalis NOT DETECTED NOT DETECTED Final  Culture, blood (routine x 2)     Status: None (Preliminary result)   Collection Time:  06/12/17 11:52 PM  Result Value Ref Range  Status   Specimen Description BLOOD LEFT FOREARM  Final   Special Requests IN PEDIATRIC BOTTLE Blood Culture adequate volume  Final   Culture  Setup Time   Final    GRAM NEGATIVE RODS AEROBIC BOTTLE ONLY CRITICAL VALUE NOTED.  VALUE IS CONSISTENT WITH PREVIOUSLY REPORTED AND CALLED VALUE.    Culture GRAM NEGATIVE RODS  Final   Report Status PENDING  Incomplete         Radiology Studies: Dg Chest 2 View  Result Date: 06/10/2017 CLINICAL DATA:  84 year old female with weakness, dyspnea and hypoxia. EXAM: CHEST  2 VIEW COMPARISON:  06/08/2017 FINDINGS: The heart size and mediastinal contours are within normal limits. Interval development of vascular congestion and perihilar airspace opacities with small bilateral pleural effusions, left greater than right consistent with CHF. Moderate aortic atherosclerosis without aneurysmal dilatation. No acute osseous abnormality. IMPRESSION: Vascular congestion with perihilar airspace opacities and small bilateral pleural effusions compatible with mild CHF. Superimposed pneumonia would be difficult to entirely exclude. Electronically Signed   By: Ashley Royalty M.D.   On: 06/10/2017 13:36   Ct Head Wo Contrast  Result Date: 06/09/2017 CLINICAL DATA:  Subdural hematoma.  Facial fractures. EXAM: CT HEAD WITHOUT CONTRAST TECHNIQUE: Contiguous axial images were obtained from the base of the skull through the vertex without intravenous contrast. COMPARISON:  CT of the head and face 06/08/2017. FINDINGS: Brain: Left frontal subdural and subarachnoid hemorrhage is again noted. Hypoattenuating cortex compatible with contusion is more prominent on today's study. The frontal extra-axial hemorrhages not significantly changed. Blood products are again noted along the falx both anteriorly and posteriorly. Bladder is layering along the tentorium, asymmetric on the left. Subarachnoid hemorrhage about the cerebellum has increased.  Intraventricular hemorrhage is more prominent on today's study with blood layering in the posterior horns of both lateral ventricles. Minimal blood is seen along the inferior fourth ventricle. There is no hydrocephalus. The brainstem is unremarkable. 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 Vascular: Vascular calcifications are again seen. There is no hyperdense vessel. Skull: Right occipital skull fracture is stable. Left zygomatic arch and temporal bone fractures are stable. Skullbase fractures cross in the left sphenoid wing and sphenoid sinuses are again noted with hemorrhage in the sphenoid sinus bilaterally. Left medial orbital wall fractures are present. No new fractures are present. Sinuses/Orbits: Blood layers in the sphenoid sinuses bilaterally. There is some fluid in posterior left ethmoid air cells, likely also blood. Maxillary sinuses and frontal sinuses are clear. The mastoid air cells are clear. Bilateral lens replacements are present. Globes and orbits are otherwise within normal limits. Of note, the left sphenoid fracture crosses the orbital apex. IMPRESSION: 1. Similar appearance of subdural and subarachnoid hemorrhage anteriorly about the left frontal lobe. 2. Diffuse subdural blood layers along the falx and left greater than right tentorium. 3. Intraventricular hemorrhage is more prominent on today's study without hydrocephalus. 4. Subarachnoid hemorrhage is now evident about the cerebellum. 5. Progressive edema associated with left frontal contusions. 6. Multiple skull and facial fractures again demonstrated. Left sphenoid wing fracture crosses the orbital apex in could involve the left optic nerve. Electronically Signed   By: San Morelle M.D.   On: 06/09/2017 07:30      Scheduled Meds: . atorvastatin  20 mg Oral q1800  . calcium-vitamin D  1 tablet Oral BID WC  . cholecalciferol  1,000 Units Oral q1800  . donepezil  5 mg Oral QHS  . levETIRAcetam  500 mg  Oral BID  .  mouth rinse  15 mL Mouth Rinse BID  . neomycin-bacitracin-polymyxin   Topical BID  . pantoprazole  40 mg Oral Daily  . polyethylene glycol  17 g Oral Daily  . potassium chloride  40 mEq Oral Daily  . QUEtiapine  25 mg Oral QHS  . triamterene-hydrochlorothiazide  1 tablet Oral Daily  . verapamil  240 mg Oral Daily   Continuous Infusions: . ceFEPime (MAXIPIME) IV Stopped (06/14/17 1400)     LOS: 6 days    Time spent in minutes: 36 minutes  Velvet Bathe, MD [facility] Pager: [email or web address] Password [email or web address] 06/14/2017, 5:12 PM

## 2017-06-14 NOTE — Progress Notes (Signed)
CSW informed by MD that pt no longer ready for DC  CSW canceled transport and informed facility and daughter.  Transport rescheduled for Monday- they will not do long distance transports over weekend and [facility] not taking out of town admissions over the weekend  [place] paged MD to inform  Plan for DC Monday at 11am if pt is stable at that time  Jorge Ny, [place] Social Worker [phone]

## 2017-06-14 NOTE — Progress Notes (Signed)
Report called to Shayne Alken, RN for pt transfer to [phone].

## 2017-06-15 LAB — CULTURE, BLOOD (ROUTINE X 2)
Special Requests: ADEQUATE
Special Requests: ADEQUATE

## 2017-06-15 LAB — GLUCOSE, CAPILLARY: GLUCOSE-CAPILLARY: 135 mg/dL — AB (ref 65–99)

## 2017-06-15 MED ORDER — CIPROFLOXACIN HCL 500 MG PO TABS
500.0000 mg | ORAL_TABLET | Freq: Two times a day (BID) | ORAL | Status: DC
  Administered 2017-06-15 – 2017-06-17 (×5): 500 mg via ORAL
  Filled 2017-06-15 (×5): qty 1

## 2017-06-15 NOTE — Progress Notes (Signed)
PROGRESS NOTE    MCKENSI REDINGER   ZWC:[id number]  DOB: 21-Jan-1926  DOA: 06/08/2017 PCP: Burnard Bunting, MD   Brief Narrative:  [name] with HTN, syncope,mild dementia, GERD admitted for a fall and found to have a subdural and subarachnoid hemorrhage, multiple skull and facial fractures admitted to [facility] as the ER doctor thought she was comfort care.    Subjective: Pt has been more somnolent  Assessment & Plan:   Active Problems:   SDH/  Subarachnoid bleed - conservative management- NS has signed off   Gram negative bacteremia - Susceptibilities reviewed and patient placed on appropriate oral antibiotic - Pt will need 14 days of antibiotics total.  Facial fractures - "Right occipital skull fracture is stable. Left zygomatic arch and temporal bone fractures are stable. Skullbase fractures cross in the left sphenoid wing and sphenoid sinuses are again noted with hemorrhage in the sphenoid sinus bilaterally. Left medial orbital wall fractures are present. " - per ENT, fracture should heal without intervention- he recommends f/u as needed  Acute encephalopathy - underlying dementia- new bleed may be affecting mentation- she remains oriented only to self - Pt has had sundowning before. Stable per my discussion with daughter.  Hypoxia - stable currently administer supplemental oxygen as needed.    Hyperlipidemia - on Statin at home    GERD (gastroesophageal reflux disease) -   PPI    Hypertension - on Maxzide and Verapamil  DVT prophylaxis: SCDs Code Status: DNR Family Communication: daughter at bedside Disposition Plan: Discharge on Monday Consultants:   NS  ENT Procedures:    Antimicrobials:  Anti-infectives (From admission, onward)   Start     Dose/Rate Route Frequency Ordered Stop   06/15/17 1115  ciprofloxacin (CIPRO) tablet 500 mg     500 mg Oral 2 times daily 06/15/17 1101     06/13/17 1230  ceFEPIme (MAXIPIME) 1 g in  dextrose 5 % 50 mL IVPB  Status:  Discontinued     1 g 100 mL/hr over 30 Minutes Intravenous Every 24 hours 06/13/17 1144 06/15/17 1101       Objective: Vitals:   06/15/17 0100 06/15/17 0400 06/15/17 1000 06/15/17 1352  BP: (!) 146/93 140/80 135/63 (!) 150/59  Pulse: 71 98 98 86  Resp: 18 18 18 18   Temp: 98.5 F (36.9 C) 99.1 F (37.3 C) 99.8 F (37.7 C) 98.8 F (37.1 C)  TempSrc: Axillary Axillary Axillary Oral  SpO2: 93% 94% 95% 95%  Weight:      Height:        Intake/Output Summary (Last 24 hours) at 06/15/2017 1441 Last data filed at 06/15/2017 0600 Gross per 24 hour  Intake 50 ml  Output 500 ml  Net -450 ml   Filed Weights   06/13/17 0442 06/14/17 0545 06/14/17 2044  Weight: 50.3 kg (110 lb 14.3 oz) 48 kg (105 lb 13.1 oz) 53.2 kg (117 lb 4.6 oz)    Examination: Exam unchanged when compared to 06/14/2017 General exam: Appears comfortable, in nad. HEENT: PERRLA, oral mucosa moist, no sclera icterus or thrush Respiratory system: Clear to auscultation. Respiratory effort normal. Cardiovascular system: S1 & S2 heard, RRR.  No murmurs  Gastrointestinal system: Abdomen soft, non-tender, nondistended. Normal bowel sound. No organomegaly Central nervous system: Alert - confused- No focal neurological deficits. Extremities: No cyanosis, clubbing or edema Skin: No rashes or ulcers Psychiatry:  Mood & affect appropriate.    Data Reviewed: I have personally reviewed following labs and imaging  studies  CBC: Recent Labs  Lab 06/10/17 0454 06/11/17 0805 06/12/17 0621 06/13/17 0351 06/13/17 2330 06/14/17 0857  WBC 16.5* 13.4* 14.9* 26.4* 30.9* 28.8*  NEUTROABS 13.9*  --   --   --   --   --   HGB 12.4 12.2 13.7 11.6* 11.5* 12.4  HCT 37.7 37.4 41.9 37.2 36.4 38.9  MCV 95.4 94.0 93.9 93.2 94.8 94.4  PLT 212 237 237 205 215 683   Basic Metabolic Panel: Recent Labs  Lab 06/09/17 0511 06/10/17 0454 06/11/17 0805 06/12/17 0621 06/13/17 0351  NA 136 141 137 134* 138    K 3.2* 3.7 3.7 4.1 3.8  CL 103 108 98* 95* 100*  CO2 23 24 29 29 29   GLUCOSE 124* 131* 121* 129* 161*  BUN 23* 23* 23* 26* 31*  CREATININE 0.72 0.77 0.69 0.74 0.89  CALCIUM 7.9* 8.7* 9.0 9.1 8.8*   GFR: Estimated Creatinine Clearance: 30.2 mL/min (by C-G formula based on SCr of 0.89 mg/dL). Liver Function Tests: Recent Labs  Lab 06/09/17 0511 06/10/17 0454  AST 24 22  ALT 18 16  ALKPHOS 83 87  BILITOT 1.0 0.7  PROT 5.2* 5.5*  ALBUMIN 2.7* 2.7*   No results for input(s): LIPASE, AMYLASE in the last 168 hours. No results for input(s): AMMONIA in the last 168 hours. Coagulation Profile: Recent Labs  Lab 06/09/17 0511  INR 1.13   Cardiac Enzymes: No results for input(s): CKTOTAL, CKMB, CKMBINDEX, TROPONINI in the last 168 hours. BNP (last 3 results) No results for input(s): PROBNP in the last 8760 hours. HbA1C: No results for input(s): HGBA1C in the last 72 hours. CBG: Recent Labs  Lab 06/09/17 0807 06/09/17 1842 06/10/17 0722 06/15/17 0610  GLUCAP 115* 201* 129* 135*   Lipid Profile: No results for input(s): CHOL, HDL, LDLCALC, TRIG, CHOLHDL, LDLDIRECT in the last 72 hours. Thyroid Function Tests: No results for input(s): TSH, T4TOTAL, FREET4, T3FREE, THYROIDAB in the last 72 hours. Anemia Panel: No results for input(s): VITAMINB12, FOLATE, FERRITIN, TIBC, IRON, RETICCTPCT in the last 72 hours. Urine analysis:    Component Value Date/Time   COLORURINE YELLOW 06/08/2017 0732   APPEARANCEUR CLEAR 06/08/2017 0732   LABSPEC 1.018 06/08/2017 0732   PHURINE 6.0 06/08/2017 0732   GLUCOSEU NEGATIVE 06/08/2017 0732   HGBUR NEGATIVE 06/08/2017 0732   BILIRUBINUR NEGATIVE 06/08/2017 0732   KETONESUR 5 (A) 06/08/2017 0732   PROTEINUR NEGATIVE 06/08/2017 0732   UROBILINOGEN 0.2 11/23/2013 2308   NITRITE NEGATIVE 06/08/2017 0732   LEUKOCYTESUR NEGATIVE 06/08/2017 0732   Sepsis Labs: @LABRCNTIP (procalcitonin:4,lacticidven:4) ) Recent Results (from the past 240  hour(s))  Blood culture (routine x 2)     Status: None   Collection Time: 06/08/17  7:46 AM  Result Value Ref Range Status   Specimen Description BLOOD RIGHT ANTECUBITAL  Final   Special Requests   Final    BOTTLES DRAWN AEROBIC AND ANAEROBIC Blood Culture adequate volume   Culture NO GROWTH 5 DAYS  Final   Report Status 06/13/2017 FINAL  Final  Blood culture (routine x 2)     Status: None   Collection Time: 06/08/17  7:53 AM  Result Value Ref Range Status   Specimen Description BLOOD LEFT ANTECUBITAL  Final   Special Requests   Final    BOTTLES DRAWN AEROBIC AND ANAEROBIC Blood Culture adequate volume   Culture NO GROWTH 5 DAYS  Final   Report Status 06/13/2017 FINAL  Final  MRSA PCR Screening     Status:  None   Collection Time: 06/08/17  5:30 PM  Result Value Ref Range Status   MRSA by PCR NEGATIVE NEGATIVE Final    Comment:        The GeneXpert MRSA Assay (FDA approved for NASAL specimens only), is one component of a comprehensive MRSA colonization surveillance program. It is not intended to diagnose MRSA infection nor to guide or monitor treatment for MRSA infections.   Culture, blood (routine x 2)     Status: Abnormal   Collection Time: 06/12/17 11:43 PM  Result Value Ref Range Status   Specimen Description BLOOD LEFT WRIST  Final   Special Requests IN PEDIATRIC BOTTLE Blood Culture adequate volume  Final   Culture  Setup Time   Final    GRAM NEGATIVE RODS AEROBIC BOTTLE ONLY CRITICAL RESULT CALLED TO, READ BACK BY AND VERIFIED WITH: Jacklynn Barnacle [phone] MLM    Culture ENTEROBACTER CLOACAE (A)  Final   Report Status 06/15/2017 FINAL  Final   Organism ID, Bacteria ENTEROBACTER CLOACAE  Final      Susceptibility   Enterobacter cloacae - MIC*    CEFAZOLIN >=64 RESISTANT Resistant     CEFEPIME <=1 SENSITIVE Sensitive     CEFTAZIDIME <=1 SENSITIVE Sensitive     CEFTRIAXONE <=1 SENSITIVE Sensitive     CIPROFLOXACIN <=0.25 SENSITIVE Sensitive     GENTAMICIN  <=1 SENSITIVE Sensitive     IMIPENEM 0.5 SENSITIVE Sensitive     TRIMETH/SULFA <=20 SENSITIVE Sensitive     PIP/TAZO <=4 SENSITIVE Sensitive     * ENTEROBACTER CLOACAE  Blood Culture ID Panel (Reflexed)     Status: Abnormal   Collection Time: 06/12/17 11:43 PM  Result Value Ref Range Status   Enterococcus species NOT DETECTED NOT DETECTED Final   Listeria monocytogenes NOT DETECTED NOT DETECTED Final   Staphylococcus species NOT DETECTED NOT DETECTED Final   Staphylococcus aureus NOT DETECTED NOT DETECTED Final   Streptococcus species NOT DETECTED NOT DETECTED Final   Streptococcus agalactiae NOT DETECTED NOT DETECTED Final   Streptococcus pneumoniae NOT DETECTED NOT DETECTED Final   Streptococcus pyogenes NOT DETECTED NOT DETECTED Final   Acinetobacter baumannii NOT DETECTED NOT DETECTED Final   Enterobacteriaceae species DETECTED (A) NOT DETECTED Final    Comment: Enterobacteriaceae represent a large family of gram-negative bacteria, not a single organism. CRITICAL RESULT CALLED TO, READ BACK BY AND VERIFIED WITH: PHARMD J MILLEN [phone] MLM    Enterobacter cloacae complex DETECTED (A) NOT DETECTED Final    Comment: CRITICAL RESULT CALLED TO, READ BACK BY AND VERIFIED WITH: PHARMD J MILLEN [phone] MLM    Escherichia coli NOT DETECTED NOT DETECTED Final   Klebsiella oxytoca NOT DETECTED NOT DETECTED Final   Klebsiella pneumoniae NOT DETECTED NOT DETECTED Final   Proteus species NOT DETECTED NOT DETECTED Final   Serratia marcescens NOT DETECTED NOT DETECTED Final   Carbapenem resistance NOT DETECTED NOT DETECTED Final   Haemophilus influenzae NOT DETECTED NOT DETECTED Final   Neisseria meningitidis NOT DETECTED NOT DETECTED Final   Pseudomonas aeruginosa NOT DETECTED NOT DETECTED Final   Candida albicans NOT DETECTED NOT DETECTED Final   Candida glabrata NOT DETECTED NOT DETECTED Final   Candida krusei NOT DETECTED NOT DETECTED Final   Candida parapsilosis NOT DETECTED  NOT DETECTED Final   Candida tropicalis NOT DETECTED NOT DETECTED Final  Culture, blood (routine x 2)     Status: Abnormal   Collection Time: 06/12/17 11:52 PM  Result Value Ref Range  Status   Specimen Description BLOOD LEFT FOREARM  Final   Special Requests IN PEDIATRIC BOTTLE Blood Culture adequate volume  Final   Culture  Setup Time   Final    GRAM NEGATIVE RODS AEROBIC BOTTLE ONLY CRITICAL VALUE NOTED.  VALUE IS CONSISTENT WITH PREVIOUSLY REPORTED AND CALLED VALUE.    Culture (A)  Final    ENTEROBACTER CLOACAE SUSCEPTIBILITIES PERFORMED ON PREVIOUS CULTURE WITHIN THE LAST 5 DAYS.    Report Status 06/15/2017 FINAL  Final         Radiology Studies: Dg Chest 2 View  Result Date: 06/10/2017 CLINICAL DATA:  84 year old female with weakness, dyspnea and hypoxia. EXAM: CHEST  2 VIEW COMPARISON:  06/08/2017 FINDINGS: The heart size and mediastinal contours are within normal limits. Interval development of vascular congestion and perihilar airspace opacities with small bilateral pleural effusions, left greater than right consistent with CHF. Moderate aortic atherosclerosis without aneurysmal dilatation. No acute osseous abnormality. IMPRESSION: Vascular congestion with perihilar airspace opacities and small bilateral pleural effusions compatible with mild CHF. Superimposed pneumonia would be difficult to entirely exclude. Electronically Signed   By: Ashley Royalty M.D.   On: 06/10/2017 13:36   Ct Head Wo Contrast  Result Date: 06/09/2017 CLINICAL DATA:  Subdural hematoma.  Facial fractures. EXAM: CT HEAD WITHOUT CONTRAST TECHNIQUE: Contiguous axial images were obtained from the base of the skull through the vertex without intravenous contrast. COMPARISON:  CT of the head and face 06/08/2017. FINDINGS: Brain: Left frontal subdural and subarachnoid hemorrhage is again noted. Hypoattenuating cortex compatible with contusion is more prominent on today's study. The frontal extra-axial hemorrhages  not significantly changed. Blood products are again noted along the falx both anteriorly and posteriorly. Bladder is layering along the tentorium, asymmetric on the left. Subarachnoid hemorrhage about the cerebellum has increased. Intraventricular hemorrhage is more prominent on today's study with blood layering in the posterior horns of both lateral ventricles. Minimal blood is seen along the inferior fourth ventricle. There is no hydrocephalus. The brainstem is unremarkable. 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 Vascular: Vascular calcifications are again seen. There is no hyperdense vessel. Skull: Right occipital skull fracture is stable. Left zygomatic arch and temporal bone fractures are stable. Skullbase fractures cross in the left sphenoid wing and sphenoid sinuses are again noted with hemorrhage in the sphenoid sinus bilaterally. Left medial orbital wall fractures are present. No new fractures are present. Sinuses/Orbits: Blood layers in the sphenoid sinuses bilaterally. There is some fluid in posterior left ethmoid air cells, likely also blood. Maxillary sinuses and frontal sinuses are clear. The mastoid air cells are clear. Bilateral lens replacements are present. Globes and orbits are otherwise within normal limits. Of note, the left sphenoid fracture crosses the orbital apex. IMPRESSION: 1. Similar appearance of subdural and subarachnoid hemorrhage anteriorly about the left frontal lobe. 2. Diffuse subdural blood layers along the falx and left greater than right tentorium. 3. Intraventricular hemorrhage is more prominent on today's study without hydrocephalus. 4. Subarachnoid hemorrhage is now evident about the cerebellum. 5. Progressive edema associated with left frontal contusions. 6. Multiple skull and facial fractures again demonstrated. Left sphenoid wing fracture crosses the orbital apex in could involve the left optic nerve. Electronically Signed   By: San Morelle  M.D.   On: 06/09/2017 07:30      Scheduled Meds: . atorvastatin  20 mg Oral q1800  . calcium-vitamin D  1 tablet Oral BID WC  . cholecalciferol  1,000 Units Oral q1800  .  ciprofloxacin  500 mg Oral BID  . donepezil  5 mg Oral QHS  . mouth rinse  15 mL Mouth Rinse BID  . neomycin-bacitracin-polymyxin   Topical BID  . pantoprazole  40 mg Oral Daily  . polyethylene glycol  17 g Oral Daily  . potassium chloride  40 mEq Oral Daily  . QUEtiapine  25 mg Oral QHS  . triamterene-hydrochlorothiazide  1 tablet Oral Daily  . verapamil  240 mg Oral Daily   Continuous Infusions:    LOS: 7 days    Time spent in minutes: 36 minutes  Velvet Bathe, MD [facility] Pager: [email or web address] Password [email or web address] 06/15/2017, 2:41 PM

## 2017-06-15 NOTE — Progress Notes (Signed)
Pharmacy Antibiotic Note Lori Boone is a 84 y.o. female admitted on 06/08/2017 with Enterobacter bacteremia. Pt has been treated with Cefepime for last 2 days but to transition to PO ciprofloxacin for further treatment.   Plan: -Begin Ciprofloxacin 500 mg BID; renal fxn borderline for every 12 hours interval but due to bacteremia will leave at q 12 hour dosing interval  -Will follow peripherally   Height: 5' (152.4 cm) Weight: 117 lb 4.6 oz (53.2 kg) IBW/kg (Calculated) : 45.5  Temp (24hrs), Avg:98.9 F (37.2 C), Min:98.4 F (36.9 C), Max:99.8 F (37.7 C)  Recent Labs  Lab 06/09/17 0511 06/10/17 0454 06/11/17 0805 06/12/17 0621 06/13/17 0351 06/13/17 2330 06/14/17 0857  WBC 16.4* 16.5* 13.4* 14.9* 26.4* 30.9* 28.8*  CREATININE 0.72 0.77 0.69 0.74 0.89  --   --     Estimated Creatinine Clearance: 30.2 mL/min (by C-G formula based on SCr of 0.89 mg/dL).    Allergies  Allergen Reactions  . Codeine Nausea Only  . Penicillins     Makes patient not feel well all over    Antimicrobials this admission: 1/3 cefepime >> 1/4  1/5 ciprofloxacin >>   Thank you for allowing pharmacy to be a part of this patient's care.  Vincenza Hews, PharmD, BCPS 06/15/2017, 11:09 AM

## 2017-06-16 DIAGNOSIS — R7881 Bacteremia: Secondary | ICD-10-CM

## 2017-06-16 LAB — CBC
HCT: 41.3 % (ref 36.0–46.0)
Hemoglobin: 13.3 g/dL (ref 12.0–15.0)
MCH: 29.7 pg (ref 26.0–34.0)
MCHC: 32.2 g/dL (ref 30.0–36.0)
MCV: 92.2 fL (ref 78.0–100.0)
PLATELETS: 304 10*3/uL (ref 150–400)
RBC: 4.48 MIL/uL (ref 3.87–5.11)
RDW: 13.1 % (ref 11.5–15.5)
WBC: 24.3 10*3/uL — AB (ref 4.0–10.5)

## 2017-06-16 LAB — C DIFFICILE QUICK SCREEN W PCR REFLEX
C DIFFICILE (CDIFF) INTERP: NOT DETECTED
C DIFFICLE (CDIFF) ANTIGEN: NEGATIVE
C Diff toxin: NEGATIVE

## 2017-06-16 NOTE — Progress Notes (Signed)
PROGRESS NOTE    Lori Boone   TDD:[id number]  DOB: 1925-10-26  DOA: 06/08/2017 PCP: Burnard Bunting, MD   Brief Narrative:  Lori Boone with HTN, syncope,mild dementia, GERD admitted for a fall and found to have a subdural and subarachnoid hemorrhage, multiple skull and facial fractures.  Pt also was found to have bacteremia with gram negative organism Lori Boone   Subjective: Pt has been more somnolent  Assessment & Plan:   Active Problems:   SDH/  Subarachnoid bleed - conservative management- NS has signed off   Gram negative bacteremia: Lori Boone - Susceptibilities reviewed and patient placed on appropriate oral antibiotic - Pt will need 14 days of antibiotics total. Day 4/14  Facial fractures - "Right occipital skull fracture is stable. Left zygomatic arch and temporal bone fractures are stable. Skullbase fractures cross in the left sphenoid wing and sphenoid sinuses are again noted with hemorrhage in the sphenoid sinus bilaterally. Left medial orbital wall fractures are present. " - per ENT, fracture should heal without intervention- he recommends f/u as needed  Acute encephalopathy - underlying dementia- new bleed may be affecting mentation- she remains oriented only to self - Pt has had sundowning before. Stable per my discussion with daughter.  Hypoxia - stable currently administer supplemental oxygen as needed.    Hyperlipidemia - on Statin at home    GERD (gastroesophageal reflux disease) -   PPI    Hypertension - on Maxzide and Verapamil  DVT prophylaxis: SCDs Code Status: DNR Family Communication: daughter at bedside Disposition Plan: Discharge on Monday Consultants:   NS  ENT Procedures:    Antimicrobials:  Anti-infectives (From admission, onward)   Start     Dose/Rate Route Frequency Ordered Stop   06/15/17 1115  ciprofloxacin (CIPRO) tablet 500 mg     500 mg Oral 2 times daily 06/15/17 1101     06/13/17 1230   ceFEPIme (MAXIPIME) 1 g in dextrose 5 % 50 mL IVPB  Status:  Discontinued     1 g 100 mL/hr over 30 Minutes Intravenous Every 24 hours 06/13/17 1144 06/15/17 1101       Objective: Vitals:   06/15/17 2121 06/16/17 0029 06/16/17 0509 06/16/17 1042  BP: (!) 140/59 131/63 133/64 (!) 147/54  Pulse: 68 89 88 83  Resp: 20 18 18 18   Temp: 99.9 F (37.7 C) 99 F (37.2 C) 99.1 F (37.3 C) 98.9 F (37.2 C)  TempSrc: Oral Oral Oral Oral  SpO2: 95% 96% 98% 97%  Weight:      Height:        Intake/Output Summary (Last 24 hours) at 06/16/2017 1437 Last data filed at 06/16/2017 0800 Gross per 24 hour  Intake 120 ml  Output 700 ml  Net -580 ml   Filed Weights   06/13/17 0442 06/14/17 0545 06/14/17 2044  Weight: 50.3 kg (110 lb 14.3 oz) 48 kg (105 lb 13.1 oz) 53.2 kg (117 lb 4.6 oz)    Examination: Exam unchanged when compared to 06/15/2017 General exam: Appears comfortable, in nad. HEENT: PERRLA, oral mucosa moist, no sclera icterus or thrush Respiratory system: Clear to auscultation. Respiratory effort normal. Cardiovascular system: S1 & S2 heard, RRR.  No murmurs  Gastrointestinal system: Abdomen soft, non-tender, nondistended. Normal bowel sound. No organomegaly Central nervous system: Alert - confused- No focal neurological deficits. Extremities: No cyanosis, clubbing or edema Skin: No rashes or ulcers Psychiatry:  Mood & affect appropriate.    Data Reviewed: I have personally reviewed following  labs and imaging studies  CBC: Recent Labs  Lab 06/10/17 0454  06/12/17 0621 06/13/17 0351 06/13/17 2330 06/14/17 0857 06/16/17 0019  WBC 16.5*   < > 14.9* 26.4* 30.9* 28.8* 24.3*  NEUTROABS 13.9*  --   --   --   --   --   --   HGB 12.4   < > 13.7 11.6* 11.5* 12.4 13.3  HCT 37.7   < > 41.9 37.2 36.4 38.9 41.3  MCV 95.4   < > 93.9 93.2 94.8 94.4 92.2  PLT 212   < > 237 205 215 240 304   < > = values in this interval not displayed.   Basic Metabolic Panel: Recent Labs  Lab  06/10/17 0454 06/11/17 0805 06/12/17 0621 06/13/17 0351  NA 141 137 134* 138  K 3.7 3.7 4.1 3.8  CL 108 98* 95* 100*  CO2 24 29 29 29   GLUCOSE 131* 121* 129* 161*  BUN 23* 23* 26* 31*  CREATININE 0.77 0.69 0.74 0.89  CALCIUM 8.7* 9.0 9.1 8.8*   GFR: Estimated Creatinine Clearance: 30.2 mL/min (by C-G formula based on SCr of 0.89 mg/dL). Liver Function Tests: Recent Labs  Lab 06/10/17 0454  AST 22  ALT 16  ALKPHOS 87  BILITOT 0.7  PROT 5.5*  ALBUMIN 2.7*   No results for input(s): LIPASE, AMYLASE in the last 168 hours. No results for input(s): AMMONIA in the last 168 hours. Coagulation Profile: No results for input(s): INR, PROTIME in the last 168 hours. Cardiac Enzymes: No results for input(s): CKTOTAL, CKMB, CKMBINDEX, TROPONINI in the last 168 hours. BNP (last 3 results) No results for input(s): PROBNP in the last 8760 hours. HbA1C: No results for input(s): HGBA1C in the last 72 hours. CBG: Recent Labs  Lab 06/09/17 1842 06/10/17 0722 06/15/17 0610  GLUCAP 201* 129* 135*   Lipid Profile: No results for input(s): CHOL, HDL, LDLCALC, TRIG, CHOLHDL, LDLDIRECT in the last 72 hours. Thyroid Function Tests: No results for input(s): TSH, T4TOTAL, FREET4, T3FREE, THYROIDAB in the last 72 hours. Anemia Panel: No results for input(s): VITAMINB12, FOLATE, FERRITIN, TIBC, IRON, RETICCTPCT in the last 72 hours. Urine analysis:    Component Value Date/Time   COLORURINE YELLOW 06/08/2017 0732   APPEARANCEUR CLEAR 06/08/2017 0732   LABSPEC 1.018 06/08/2017 0732   PHURINE 6.0 06/08/2017 0732   GLUCOSEU NEGATIVE 06/08/2017 0732   HGBUR NEGATIVE 06/08/2017 0732   BILIRUBINUR NEGATIVE 06/08/2017 0732   KETONESUR 5 (A) 06/08/2017 0732   PROTEINUR NEGATIVE 06/08/2017 0732   UROBILINOGEN 0.2 11/23/2013 2308   NITRITE NEGATIVE 06/08/2017 0732   LEUKOCYTESUR NEGATIVE 06/08/2017 0732   Sepsis Labs: @LABRCNTIP (procalcitonin:4,lacticidven:4) ) Recent Results (from the past  240 hour(s))  Blood culture (routine x 2)     Status: None   Collection Time: 06/08/17  7:46 AM  Result Value Ref Range Status   Specimen Description BLOOD RIGHT ANTECUBITAL  Final   Special Requests   Final    BOTTLES DRAWN AEROBIC AND ANAEROBIC Blood Culture adequate volume   Culture NO GROWTH 5 DAYS  Final   Report Status 06/13/2017 FINAL  Final  Blood culture (routine x 2)     Status: None   Collection Time: 06/08/17  7:53 AM  Result Value Ref Range Status   Specimen Description BLOOD LEFT ANTECUBITAL  Final   Special Requests   Final    BOTTLES DRAWN AEROBIC AND ANAEROBIC Blood Culture adequate volume   Culture NO GROWTH 5 DAYS  Final  Report Status 06/13/2017 FINAL  Final  MRSA PCR Screening     Status: None   Collection Time: 06/08/17  5:30 PM  Result Value Ref Range Status   MRSA by PCR NEGATIVE NEGATIVE Final    Comment:        The GeneXpert MRSA Assay (FDA approved for NASAL specimens only), is one component of a comprehensive MRSA colonization surveillance program. It is not intended to diagnose MRSA infection nor to guide or monitor treatment for MRSA infections.   Culture, blood (routine x 2)     Status: Abnormal   Collection Time: 06/12/17 11:43 PM  Result Value Ref Range Status   Specimen Description BLOOD LEFT WRIST  Final   Special Requests IN PEDIATRIC BOTTLE Blood Culture adequate volume  Final   Culture  Setup Time   Final    GRAM NEGATIVE RODS AEROBIC BOTTLE ONLY CRITICAL RESULT CALLED TO, READ BACK BY AND VERIFIED WITH: Jacklynn Barnacle [id number] MLM    Culture Lori Boone (A)  Final   Report Status 06/15/2017 FINAL  Final   Organism ID, Bacteria Lori Boone  Final      Susceptibility   Lori Boone - MIC*    CEFAZOLIN >=64 RESISTANT Resistant     CEFEPIME <=1 SENSITIVE Sensitive     CEFTAZIDIME <=1 SENSITIVE Sensitive     CEFTRIAXONE <=1 SENSITIVE Sensitive     CIPROFLOXACIN <=0.25 SENSITIVE Sensitive      GENTAMICIN <=1 SENSITIVE Sensitive     IMIPENEM 0.5 SENSITIVE Sensitive     TRIMETH/SULFA <=20 SENSITIVE Sensitive     PIP/TAZO <=4 SENSITIVE Sensitive     * Lori Boone  Blood Culture ID Panel (Reflexed)     Status: Abnormal   Collection Time: 06/12/17 11:43 PM  Result Value Ref Range Status   Enterococcus species NOT DETECTED NOT DETECTED Final   Listeria monocytogenes NOT DETECTED NOT DETECTED Final   Staphylococcus species NOT DETECTED NOT DETECTED Final   Staphylococcus aureus NOT DETECTED NOT DETECTED Final   Streptococcus species NOT DETECTED NOT DETECTED Final   Streptococcus agalactiae NOT DETECTED NOT DETECTED Final   Streptococcus pneumoniae NOT DETECTED NOT DETECTED Final   Streptococcus pyogenes NOT DETECTED NOT DETECTED Final   Acinetobacter baumannii NOT DETECTED NOT DETECTED Final   Enterobacteriaceae species DETECTED (A) NOT DETECTED Final    Comment: Enterobacteriaceae represent a large family of gram-negative bacteria, not a single organism. CRITICAL RESULT CALLED TO, READ BACK BY AND VERIFIED WITH: PHARMD J MILLEN [phone] MLM    Lori Boone complex DETECTED (A) NOT DETECTED Final    Comment: CRITICAL RESULT CALLED TO, READ BACK BY AND VERIFIED WITH: PHARMD J MILLEN [id number] MLM    Escherichia coli NOT DETECTED NOT DETECTED Final   Klebsiella oxytoca NOT DETECTED NOT DETECTED Final   Klebsiella pneumoniae NOT DETECTED NOT DETECTED Final   Proteus species NOT DETECTED NOT DETECTED Final   Serratia marcescens NOT DETECTED NOT DETECTED Final   Carbapenem resistance NOT DETECTED NOT DETECTED Final   Haemophilus influenzae NOT DETECTED NOT DETECTED Final   Neisseria meningitidis NOT DETECTED NOT DETECTED Final   Pseudomonas aeruginosa NOT DETECTED NOT DETECTED Final   Candida albicans NOT DETECTED NOT DETECTED Final   Candida glabrata NOT DETECTED NOT DETECTED Final   Candida krusei NOT DETECTED NOT DETECTED Final   Candida parapsilosis NOT  DETECTED NOT DETECTED Final   Candida tropicalis NOT DETECTED NOT DETECTED Final  Culture, blood (routine x 2)  Status: Abnormal   Collection Time: 06/12/17 11:52 PM  Result Value Ref Range Status   Specimen Description BLOOD LEFT FOREARM  Final   Special Requests IN PEDIATRIC BOTTLE Blood Culture adequate volume  Final   Culture  Setup Time   Final    GRAM NEGATIVE RODS AEROBIC BOTTLE ONLY CRITICAL VALUE NOTED.  VALUE IS CONSISTENT WITH PREVIOUSLY REPORTED AND CALLED VALUE.    Culture (A)  Final    Lori Boone SUSCEPTIBILITIES PERFORMED ON PREVIOUS CULTURE WITHIN THE LAST 5 DAYS.    Report Status 06/15/2017 FINAL  Final  C difficile quick scan w PCR reflex     Status: None   Collection Time: 06/16/17 12:22 PM  Result Value Ref Range Status   C Diff antigen NEGATIVE NEGATIVE Final   C Diff toxin NEGATIVE NEGATIVE Final   C Diff interpretation No C. difficile detected.  Final         Radiology Studies: Dg Chest 2 View  Result Date: 06/10/2017 CLINICAL DATA:  84 year old female with weakness, dyspnea and hypoxia. EXAM: CHEST  2 VIEW COMPARISON:  06/08/2017 FINDINGS: The heart size and mediastinal contours are within normal limits. Interval development of vascular congestion and perihilar airspace opacities with small bilateral pleural effusions, left greater than right consistent with CHF. Moderate aortic atherosclerosis without aneurysmal dilatation. No acute osseous abnormality. IMPRESSION: Vascular congestion with perihilar airspace opacities and small bilateral pleural effusions compatible with mild CHF. Superimposed pneumonia would be difficult to entirely exclude. Electronically Signed   By: Ashley Royalty M.D.   On: 06/10/2017 13:36   Ct Head Wo Contrast  Result Date: 06/09/2017 CLINICAL DATA:  Subdural hematoma.  Facial fractures. EXAM: CT HEAD WITHOUT CONTRAST TECHNIQUE: Contiguous axial images were obtained from the base of the skull through the vertex without  intravenous contrast. COMPARISON:  CT of the head and face 06/08/2017. FINDINGS: Brain: Left frontal subdural and subarachnoid hemorrhage is again noted. Hypoattenuating cortex compatible with contusion is more prominent on today's study. The frontal extra-axial hemorrhages not significantly changed. Blood products are again noted along the falx both anteriorly and posteriorly. Bladder is layering along the tentorium, asymmetric on the left. Subarachnoid hemorrhage about the cerebellum has increased. Intraventricular hemorrhage is more prominent on today's study with blood layering in the posterior horns of both lateral ventricles. Minimal blood is seen along the inferior fourth ventricle. There is no hydrocephalus. The brainstem is unremarkable. 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 Vascular: Vascular calcifications are again seen. There is no hyperdense vessel. Skull: Right occipital skull fracture is stable. Left zygomatic arch and temporal bone fractures are stable. Skullbase fractures cross in the left sphenoid wing and sphenoid sinuses are again noted with hemorrhage in the sphenoid sinus bilaterally. Left medial orbital wall fractures are present. No new fractures are present. Sinuses/Orbits: Blood layers in the sphenoid sinuses bilaterally. There is some fluid in posterior left ethmoid air cells, likely also blood. Maxillary sinuses and frontal sinuses are clear. The mastoid air cells are clear. Bilateral lens replacements are present. Globes and orbits are otherwise within normal limits. Of note, the left sphenoid fracture crosses the orbital apex. IMPRESSION: 1. Similar appearance of subdural and subarachnoid hemorrhage anteriorly about the left frontal lobe. 2. Diffuse subdural blood layers along the falx and left greater than right tentorium. 3. Intraventricular hemorrhage is more prominent on today's study without hydrocephalus. 4. Subarachnoid hemorrhage is now evident about the  cerebellum. 5. Progressive edema associated with left frontal contusions. 6. Multiple  skull and facial fractures again demonstrated. Left sphenoid wing fracture crosses the orbital apex in could involve the left optic nerve. Electronically Signed   By: San Morelle M.D.   On: 06/09/2017 07:30      Scheduled Meds: . atorvastatin  20 mg Oral q1800  . calcium-vitamin D  1 tablet Oral BID WC  . cholecalciferol  1,000 Units Oral q1800  . ciprofloxacin  500 mg Oral BID  . donepezil  5 mg Oral QHS  . mouth rinse  15 mL Mouth Rinse BID  . neomycin-bacitracin-polymyxin   Topical BID  . pantoprazole  40 mg Oral Daily  . polyethylene glycol  17 g Oral Daily  . potassium chloride  40 mEq Oral Daily  . QUEtiapine  25 mg Oral QHS  . triamterene-hydrochlorothiazide  1 tablet Oral Daily  . verapamil  240 mg Oral Daily   Continuous Infusions:    LOS: 8 days    Time spent in minutes: 36 minutes  Velvet Bathe, MD [facility] Pager: [email or web address] Password [facility] 06/16/2017, 2:37 PM

## 2017-06-17 DIAGNOSIS — R7881 Bacteremia: Secondary | ICD-10-CM | POA: Diagnosis not present

## 2017-06-17 DIAGNOSIS — M6281 Muscle weakness (generalized): Secondary | ICD-10-CM | POA: Diagnosis not present

## 2017-06-17 DIAGNOSIS — I629 Nontraumatic intracranial hemorrhage, unspecified: Secondary | ICD-10-CM | POA: Diagnosis not present

## 2017-06-17 DIAGNOSIS — R339 Retention of urine, unspecified: Secondary | ICD-10-CM | POA: Diagnosis not present

## 2017-06-17 DIAGNOSIS — S065X0D Traumatic subdural hemorrhage without loss of consciousness, subsequent encounter: Secondary | ICD-10-CM | POA: Diagnosis not present

## 2017-06-17 DIAGNOSIS — R109 Unspecified abdominal pain: Secondary | ICD-10-CM | POA: Diagnosis not present

## 2017-06-17 DIAGNOSIS — D72829 Elevated white blood cell count, unspecified: Secondary | ICD-10-CM | POA: Diagnosis not present

## 2017-06-17 DIAGNOSIS — F0390 Unspecified dementia without behavioral disturbance: Secondary | ICD-10-CM | POA: Diagnosis not present

## 2017-06-17 DIAGNOSIS — R278 Other lack of coordination: Secondary | ICD-10-CM | POA: Diagnosis not present

## 2017-06-17 DIAGNOSIS — S0291XB Unspecified fracture of skull, initial encounter for open fracture: Secondary | ICD-10-CM | POA: Diagnosis not present

## 2017-06-17 DIAGNOSIS — R451 Restlessness and agitation: Secondary | ICD-10-CM | POA: Diagnosis not present

## 2017-06-17 DIAGNOSIS — S0181XD Laceration without foreign body of other part of head, subsequent encounter: Secondary | ICD-10-CM | POA: Diagnosis not present

## 2017-06-17 DIAGNOSIS — I6200 Nontraumatic subdural hemorrhage, unspecified: Secondary | ICD-10-CM | POA: Diagnosis not present

## 2017-06-17 DIAGNOSIS — I609 Nontraumatic subarachnoid hemorrhage, unspecified: Secondary | ICD-10-CM | POA: Diagnosis not present

## 2017-06-17 DIAGNOSIS — K5900 Constipation, unspecified: Secondary | ICD-10-CM | POA: Diagnosis not present

## 2017-06-17 DIAGNOSIS — R488 Other symbolic dysfunctions: Secondary | ICD-10-CM | POA: Diagnosis not present

## 2017-06-17 DIAGNOSIS — R262 Difficulty in walking, not elsewhere classified: Secondary | ICD-10-CM | POA: Diagnosis not present

## 2017-06-17 DIAGNOSIS — E871 Hypo-osmolality and hyponatremia: Secondary | ICD-10-CM | POA: Diagnosis not present

## 2017-06-17 DIAGNOSIS — R4182 Altered mental status, unspecified: Secondary | ICD-10-CM | POA: Diagnosis not present

## 2017-06-17 DIAGNOSIS — B9689 Other specified bacterial agents as the cause of diseases classified elsewhere: Secondary | ICD-10-CM | POA: Diagnosis not present

## 2017-06-17 DIAGNOSIS — D649 Anemia, unspecified: Secondary | ICD-10-CM | POA: Diagnosis not present

## 2017-06-17 DIAGNOSIS — G4700 Insomnia, unspecified: Secondary | ICD-10-CM | POA: Diagnosis not present

## 2017-06-17 DIAGNOSIS — R1311 Dysphagia, oral phase: Secondary | ICD-10-CM | POA: Diagnosis not present

## 2017-06-17 DIAGNOSIS — R41841 Cognitive communication deficit: Secondary | ICD-10-CM | POA: Diagnosis not present

## 2017-06-17 DIAGNOSIS — S0291XD Unspecified fracture of skull, subsequent encounter for fracture with routine healing: Secondary | ICD-10-CM | POA: Diagnosis not present

## 2017-06-17 MED ORDER — CIPROFLOXACIN HCL 500 MG PO TABS: 500.0000 mg | ORAL_TABLET | Freq: Two times a day (BID) | ORAL | 0 refills | Status: AC

## 2017-06-17 MED ORDER — ACETAMINOPHEN 325 MG PO TABS: 650.0000 mg | ORAL_TABLET | Freq: Four times a day (QID) | ORAL | Status: AC | PRN

## 2017-06-17 MED ORDER — LORAZEPAM 1 MG PO TABS
1.0000 mg | ORAL_TABLET | Freq: Once | ORAL | Status: AC | PRN
  Administered 2017-06-17: 1 mg via ORAL
  Filled 2017-06-17: qty 1

## 2017-06-17 NOTE — Discharge Summary (Signed)
Physician Discharge Summary  Lori Boone VCB:[id number] DOB: 12-20-1925 DOA: 06/08/2017  PCP: Burnard Bunting, MD  Admit date: 06/08/2017 Discharge date: 06/17/2017  Time spent: > 35 minutes  Recommendations for Outpatient Follow-up:  Patient needs 10 more days of antibiotics to complete a 14 day total treatment course.  Discharge Diagnoses:  Active Problems:   Hypertension   SDH (subdural hematoma) (HCC)   Hyperlipidemia   Elevated glucose level   GERD (gastroesophageal reflux disease)   Subarachnoid bleed (HCC)   Dementia   Closed extensive facial fractures ([facility])   Bacteremia due to Gram-negative bacteria   Discharge Condition: stable  Diet recommendation: dys 1 diet  Filed Weights   06/13/17 0442 06/14/17 0545 06/14/17 2044  Weight: 50.3 kg (110 lb 14.3 oz) 48 kg (105 lb 13.1 oz) 53.2 kg (117 lb 4.6 oz)    History of present illness:  Lori Boone with HTN, syncope,mild dementia, GERD admitted for a fall and found to have a subdural and subarachnoid hemorrhage, multiple skull and facial fractures.  Pt also was found to have bacteremia with gram negative organism [facility] Hospital Course:   SDH/  Subarachnoid bleed - conservative management- NS has signed off   Gram negative bacteremia: ENTEROBACTER CLOACAE - Susceptibilities reviewed and patient placed on appropriate oral antibiotic - Pt will need 14 days of antibiotics total. Day 4/14 pt needs 10 more days  Facial fractures - "Right occipital skull fracture is stable. Left zygomatic arch and temporal bone fractures are stable. Skullbase fractures cross in the left sphenoid wing and sphenoid sinuses are again noted with hemorrhage in the sphenoid sinus bilaterally. Left medial orbital wall fractures are present. " - per ENT, fracture should heal without intervention- he recommends f/u as needed  Acute encephalopathy - underlying dementia- new bleed may be affecting mentation- she remains  oriented only to self - Pt has had sundowning before. Stable per my discussion with daughter. - consider seroquel at facility as this helped in house. Will not continue on discharge however and will leave at primary care physicians discretion at accepting facility  Hypoxia - resolved    Hyperlipidemia - on Statin at home    GERD (gastroesophageal reflux disease) -   PPI    Hypertension - on Maxzide and Verapamil  Procedures:  None  Consultations:  ENT  Neurosurgery  Discharge Exam: Vitals:   06/17/17 0236 06/17/17 0613  BP: 137/61 (!) 155/62  Pulse: 90 86  Resp: 16 17  Temp: 98.5 F (36.9 C) 98.3 F (36.8 C)  SpO2: 98% 99%    General: Pt in nad, alert and awake Cardiovascular: rrr, no rubs Respiratory: no increased wob, no wheezes  Discharge Instructions   Discharge Instructions    Call MD for:  severe uncontrolled pain   Complete by:  As directed    Call MD for:  temperature >100.4   Complete by:  As directed    Diet - low sodium heart healthy   Complete by:  As directed    Discharge instructions   Complete by:  As directed    Ensure patient has pcp at facility follow her. She will need 10 more days of antibiotics   Increase activity slowly   Complete by:  As directed      Allergies as of 06/17/2017      Reactions   Codeine Nausea Only   Penicillins    Makes patient not feel well all over      Medication List  STOP taking these medications   fexofenadine 180 MG tablet Commonly known as:  ALLEGRA   FLUZONE HIGH-DOSE 0.5 ML injection Generic drug:  Influenza vac split quadrivalent PF   LORazepam 0.5 MG tablet Commonly known as:  ATIVAN   NONFORMULARY OR COMPOUNDED ITEM     TAKE these medications   acetaminophen 325 MG tablet Commonly known as:  TYLENOL Take 2 tablets (650 mg total) by mouth every 6 (six) hours as needed for mild pain (or Fever >/= 101). What changed:    medication strength  how much to take  when to take  this  reasons to take this   atorvastatin 20 MG tablet Commonly known as:  LIPITOR Take 20 mg by mouth daily at 6 PM.   calcium carbonate 750 MG chewable tablet Commonly known as:  TUMS EX Chew 2 tablets by mouth as needed for heartburn.   calcium-vitamin D 500-200 MG-UNIT tablet Commonly known as:  OSCAL WITH D Take 1 tablet by mouth 2 (two) times daily.   cholecalciferol 1000 units tablet Commonly known as:  VITAMIN D Take 1,000 Units by mouth daily at 6 PM.   ciprofloxacin 500 MG tablet Commonly known as:  CIPRO Take 1 tablet (500 mg total) by mouth 2 (two) times daily for 10 days.   docusate sodium 100 MG capsule Commonly known as:  COLACE Take 100 mg by mouth as needed for mild constipation.   magnesium gluconate 500 MG tablet Commonly known as:  MAGONATE Take 500 mg by mouth 2 (two) times daily.   NASACORT ALLERGY 24HR 55 MCG/ACT Aero nasal inhaler Generic drug:  triamcinolone Place 2 sprays into both nostrils 2 (two) times daily as needed (congestion).   omeprazole 40 MG capsule Commonly known as:  PRILOSEC Take 40 mg by mouth every morning.   PROBIOTIC DAILY PO Take 1 tablet by mouth every morning.   sodium chloride 0.65 % Soln nasal spray Commonly known as:  OCEAN Place 1 spray into both nostrils daily at 6 PM.   SYSTANE OP Place 1 drop into both eyes daily as needed (for dry eyes).   triamterene-hydrochlorothiazide 75-50 MG tablet Commonly known as:  MAXZIDE Take 1 tablet by mouth daily.   verapamil 240 MG CR tablet Commonly known as:  CALAN-SR Take 240 mg by mouth daily.      Allergies  Allergen Reactions  . Codeine Nausea Only  . Penicillins     Makes patient not feel well all over      The results of significant diagnostics from this hospitalization (including imaging, microbiology, ancillary and laboratory) are listed below for reference.    Significant Diagnostic Studies: Dg Chest 2 View  Result Date: 06/10/2017 CLINICAL DATA:   84 year old female with weakness, dyspnea and hypoxia. EXAM: CHEST  2 VIEW COMPARISON:  06/08/2017 FINDINGS: The heart size and mediastinal contours are within normal limits. Interval development of vascular congestion and perihilar airspace opacities with small bilateral pleural effusions, left greater than right consistent with CHF. Moderate aortic atherosclerosis without aneurysmal dilatation. No acute osseous abnormality. IMPRESSION: Vascular congestion with perihilar airspace opacities and small bilateral pleural effusions compatible with mild CHF. Superimposed pneumonia would be difficult to entirely exclude. Electronically Signed   By: Ashley Royalty M.D.   On: 06/10/2017 13:36   Ct Head Wo Contrast  Result Date: 06/09/2017 CLINICAL DATA:  Subdural hematoma.  Facial fractures. EXAM: CT HEAD WITHOUT CONTRAST TECHNIQUE: Contiguous axial images were obtained from the base of the skull through the vertex  without intravenous contrast. COMPARISON:  CT of the head and face 06/08/2017. FINDINGS: Brain: Left frontal subdural and subarachnoid hemorrhage is again noted. Hypoattenuating cortex compatible with contusion is more prominent on today's study. The frontal extra-axial hemorrhages not significantly changed. Blood products are again noted along the falx both anteriorly and posteriorly. Bladder is layering along the tentorium, asymmetric on the left. Subarachnoid hemorrhage about the cerebellum has increased. Intraventricular hemorrhage is more prominent on today's study with blood layering in the posterior horns of both lateral ventricles. Minimal blood is seen along the inferior fourth ventricle. There is no hydrocephalus. The brainstem is unremarkable. 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 Vascular: Vascular calcifications are again seen. There is no hyperdense vessel. Skull: Right occipital skull fracture is stable. Left zygomatic arch and temporal bone fractures are stable. Skullbase  fractures cross in the left sphenoid wing and sphenoid sinuses are again noted with hemorrhage in the sphenoid sinus bilaterally. Left medial orbital wall fractures are present. No new fractures are present. Sinuses/Orbits: Blood layers in the sphenoid sinuses bilaterally. There is some fluid in posterior left ethmoid air cells, likely also blood. Maxillary sinuses and frontal sinuses are clear. The mastoid air cells are clear. Bilateral lens replacements are present. Globes and orbits are otherwise within normal limits. Of note, the left sphenoid fracture crosses the orbital apex. IMPRESSION: 1. Similar appearance of subdural and subarachnoid hemorrhage anteriorly about the left frontal lobe. 2. Diffuse subdural blood layers along the falx and left greater than right tentorium. 3. Intraventricular hemorrhage is more prominent on today's study without hydrocephalus. 4. Subarachnoid hemorrhage is now evident about the cerebellum. 5. Progressive edema associated with left frontal contusions. 6. Multiple skull and facial fractures again demonstrated. Left sphenoid wing fracture crosses the orbital apex in could involve the left optic nerve. Electronically Signed   By: San Morelle M.D.   On: 06/09/2017 07:30   Ct Head Wo Contrast  Result Date: 06/08/2017 CLINICAL DATA:  Found down. Altered mental status. Large laceration on the back of the head and hematoma over the left temple. EXAM: CT HEAD WITHOUT CONTRAST CT CERVICAL SPINE WITHOUT CONTRAST TECHNIQUE: Multidetector CT imaging of the head and cervical spine was performed following the standard protocol without intravenous contrast. Multiplanar CT image reconstructions of the cervical spine were also generated. COMPARISON:  MRI and CT head dated September 03, 2013. FINDINGS: CT HEAD FINDINGS Brain: There is an acute left cerebral convexity subdural hematoma, measuring up to a maximal thickness of 15 mm along the left frontal lobe. There is mass effect on the  adjacent left frontal gyri. There is no midline shift or herniation. There is a small amount of subarachnoid blood in the left frontal sulci. Stable cerebral atrophy and chronic microvascular ischemic white matter disease. Vascular: Atherosclerotic vascular calcification of the carotid siphons. No hyperdense vessel. Skull: There is an acute, nondisplaced fracture of the left parietal bone, extending into the greater wing of the sphenoid. There is a nondisplaced fracture of the right side of the occipital bone. Sinuses/Orbits: Layering blood within the bilateral sphenoid sinuses and left ethmoid air cells. The mastoid air cells are clear. Small foci of air within the right infratemporal fossa. Other: Small scalp hematomas over the right occipital bone and left frontal/parietal bones. CT CERVICAL SPINE FINDINGS Alignment: Focal reversal of the normal cervical lordosis at C4. Trace anterolisthesis at C3-C4 and C7-T1. Trace stepwise retrolisthesis at C4-C5 and C5-C6. No traumatic malalignment. Skull base and vertebrae: No acute fracture.  No primary bone lesion or focal pathologic process. Soft tissues and spinal canal: No prevertebral fluid or swelling. No visible canal hematoma. Disc levels: Multilevel degenerative changes of the cervical spine, moderate at C4-C5 and C5-C6. At least moderate central spinal canal stenosis at C4-C5 due to posterior disc osteophyte complex. Scattered moderate facet arthropathy, greater on the left. Moderate to severe uncovertebral hypertrophy at C4-C5 and C5-C6 resulting in mild neuroforaminal stenosis on the left at C4-C5 and moderate neuroforaminal stenosis on the right at C5-C6. Upper chest: Negative. Other: There is a 1.1 cm low-density nodule in the left thyroid lobe. IMPRESSION: 1. Acute left cerebral convexity subdural hematoma, measuring up to 15 mm along the left frontal lobe. Adjacent mass effect on the left frontal gyri, without midline shift or herniation. 2. Small amount of  subarachnoid hemorrhage in the left frontal sulci. 3. Acute, nondisplaced fracture of the left parietal bone, extending into the greater wing of the sphenoid. 4. Nondisplaced fracture through the right aspect of the occipital bone. 5. Layering blood within the bilateral sphenoid sinuses and left ethmoid air cells, with small amount of air in the right infratemporal fossa. Findings are concerning for additional facial fractures, and maxillofacial CT is recommended for further evaluation. 6. No acute cervical spine fracture. Degenerative changes, moderate at C4-C5 and C5-C6. Critical Value/emergent results were called by telephone at the time of interpretation on 06/08/2017 at 8:59 am to Dr. Pattricia Boss , who verbally acknowledged these results. Electronically Signed   By: Titus Dubin M.D.   On: 06/08/2017 08:59   Ct Cervical Spine Wo Contrast  Result Date: 06/08/2017 CLINICAL DATA:  Found down. Altered mental status. Large laceration on the back of the head and hematoma over the left temple. EXAM: CT HEAD WITHOUT CONTRAST CT CERVICAL SPINE WITHOUT CONTRAST TECHNIQUE: Multidetector CT imaging of the head and cervical spine was performed following the standard protocol without intravenous contrast. Multiplanar CT image reconstructions of the cervical spine were also generated. COMPARISON:  MRI and CT head dated September 03, 2013. FINDINGS: CT HEAD FINDINGS Brain: There is an acute left cerebral convexity subdural hematoma, measuring up to a maximal thickness of 15 mm along the left frontal lobe. There is mass effect on the adjacent left frontal gyri. There is no midline shift or herniation. There is a small amount of subarachnoid blood in the left frontal sulci. Stable cerebral atrophy and chronic microvascular ischemic white matter disease. Vascular: Atherosclerotic vascular calcification of the carotid siphons. No hyperdense vessel. Skull: There is an acute, nondisplaced fracture of the left parietal bone,  extending into the greater wing of the sphenoid. There is a nondisplaced fracture of the right side of the occipital bone. Sinuses/Orbits: Layering blood within the bilateral sphenoid sinuses and left ethmoid air cells. The mastoid air cells are clear. Small foci of air within the right infratemporal fossa. Other: Small scalp hematomas over the right occipital bone and left frontal/parietal bones. CT CERVICAL SPINE FINDINGS Alignment: Focal reversal of the normal cervical lordosis at C4. Trace anterolisthesis at C3-C4 and C7-T1. Trace stepwise retrolisthesis at C4-C5 and C5-C6. No traumatic malalignment. Skull base and vertebrae: No acute fracture. No primary bone lesion or focal pathologic process. Soft tissues and spinal canal: No prevertebral fluid or swelling. No visible canal hematoma. Disc levels: Multilevel degenerative changes of the cervical spine, moderate at C4-C5 and C5-C6. At least moderate central spinal canal stenosis at C4-C5 due to posterior disc osteophyte complex. Scattered moderate facet arthropathy, greater on the left. Moderate  to severe uncovertebral hypertrophy at C4-C5 and C5-C6 resulting in mild neuroforaminal stenosis on the left at C4-C5 and moderate neuroforaminal stenosis on the right at C5-C6. Upper chest: Negative. Other: There is a 1.1 cm low-density nodule in the left thyroid lobe. IMPRESSION: 1. Acute left cerebral convexity subdural hematoma, measuring up to 15 mm along the left frontal lobe. Adjacent mass effect on the left frontal gyri, without midline shift or herniation. 2. Small amount of subarachnoid hemorrhage in the left frontal sulci. 3. Acute, nondisplaced fracture of the left parietal bone, extending into the greater wing of the sphenoid. 4. Nondisplaced fracture through the right aspect of the occipital bone. 5. Layering blood within the bilateral sphenoid sinuses and left ethmoid air cells, with small amount of air in the right infratemporal fossa. Findings are  concerning for additional facial fractures, and maxillofacial CT is recommended for further evaluation. 6. No acute cervical spine fracture. Degenerative changes, moderate at C4-C5 and C5-C6. Critical Value/emergent results were called by telephone at the time of interpretation on 06/08/2017 at 8:59 am to Dr. Pattricia Boss , who verbally acknowledged these results. Electronically Signed   By: Titus Dubin M.D.   On: 06/08/2017 08:59   Dg Chest Port 1 View  Result Date: 06/12/2017 CLINICAL DATA:  Fever EXAM: PORTABLE CHEST 1 VIEW COMPARISON:  06/10/2017 FINDINGS: The heart size and mediastinal contours are within normal limits. Aortic atherosclerosis is noted as before without aneurysm. Interval clearing of right effusion. Stable small left pleural effusion with adjacent atelectasis. The visualized skeletal structures are unremarkable. IMPRESSION: Clearing of right pleural effusion. Stable small left pleural effusion with atelectasis at the left lung base. Electronically Signed   By: Ashley Royalty M.D.   On: 06/12/2017 23:21   Dg Chest Port 1 View  Result Date: 06/08/2017 CLINICAL DATA:  Altered mental status.  Hypoxia. EXAM: PORTABLE CHEST 1 VIEW COMPARISON:  11/23/2013 FINDINGS: Numerous leads and wires project over the chest. Midline trachea. Normal heart size. Atherosclerosis in the transverse aorta. Tortuous thoracic aorta. No pleural effusion or pneumothorax. Clear lungs. IMPRESSION: No acute cardiopulmonary disease. Aortic Atherosclerosis (ICD10-I70.0). Electronically Signed   By: Abigail Miyamoto M.D.   On: 06/08/2017 08:29   Ct Maxillofacial Wo Contrast  Result Date: 06/08/2017 CLINICAL DATA:  Patient found down. Acute subdural hematoma on CT head. Evaluate for facial fracture. EXAM: CT MAXILLOFACIAL WITHOUT CONTRAST TECHNIQUE: Multidetector CT imaging of the maxillofacial structures was performed. Multiplanar CT image reconstructions were also generated. COMPARISON:  CT head and cervical spine from  same day. FINDINGS: Osseous: There is a nondisplaced fracture through the left zygomatic arch. Probable nondisplaced fracture of the left lamina papyracea, with a few small foci of air noted along the medial left orbital wall. Probable nondisplaced fracture of the right zygoma along the lateral wall of the right orbit, with a few small foci of air noted posteriorly in the infratemporal fossa. The left parietal bone fracture extending into the greater wing of the left sphenoid bone is again seen. 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 No mandible fracture. Orbits: Negative. No traumatic or inflammatory finding. Sinuses: Layering blood products in the bilateral sphenoid sinuses and left ethmoid air cells. Remaining paranasal sinuses and mastoid air cells are clear. Soft tissues: Negative. Limited intracranial: Partially visualized left cerebral convexity subdural hematoma, better evaluated on CT head from same day. IMPRESSION: 1. Probable nondisplaced fractures of the left lamina papyracea and right zygoma along the lateral wall of the right orbit. 2. Nondisplaced fracture  of the left zygomatic arch. 3. Unchanged layering hemorrhage in the bilateral sphenoid sinuses and left ethmoid air cells. Electronically Signed   By: Titus Dubin M.D.   On: 06/08/2017 10:19    Microbiology: Recent Results (from the past 240 hour(s))  Blood culture (routine x 2)     Status: None   Collection Time: 06/08/17  7:46 AM  Result Value Ref Range Status   Specimen Description BLOOD RIGHT ANTECUBITAL  Final   Special Requests   Final    BOTTLES DRAWN AEROBIC AND ANAEROBIC Blood Culture adequate volume   Culture NO GROWTH 5 DAYS  Final   Report Status 06/13/2017 FINAL  Final  Blood culture (routine x 2)     Status: None   Collection Time: 06/08/17  7:53 AM  Result Value Ref Range Status   Specimen Description BLOOD LEFT ANTECUBITAL  Final   Special Requests   Final    BOTTLES DRAWN AEROBIC AND ANAEROBIC Blood  Culture adequate volume   Culture NO GROWTH 5 DAYS  Final   Report Status 06/13/2017 FINAL  Final  MRSA PCR Screening     Status: None   Collection Time: 06/08/17  5:30 PM  Result Value Ref Range Status   MRSA by PCR NEGATIVE NEGATIVE Final    Comment:        The GeneXpert MRSA Assay (FDA approved for NASAL specimens only), is one component of a comprehensive MRSA colonization surveillance program. It is not intended to diagnose MRSA infection nor to guide or monitor treatment for MRSA infections.   Culture, blood (routine x 2)     Status: Abnormal   Collection Time: 06/12/17 11:43 PM  Result Value Ref Range Status   Specimen Description BLOOD LEFT WRIST  Final   Special Requests IN PEDIATRIC BOTTLE Blood Culture adequate volume  Final   Culture  Setup Time   Final    GRAM NEGATIVE RODS AEROBIC BOTTLE ONLY CRITICAL RESULT CALLED TO, READ BACK BY AND VERIFIED WITH: Jacklynn Barnacle [id number] MLM    Culture ENTEROBACTER CLOACAE (A)  Final   Report Status 06/15/2017 FINAL  Final   Organism ID, Bacteria ENTEROBACTER CLOACAE  Final      Susceptibility   Enterobacter cloacae - MIC*    CEFAZOLIN >=64 RESISTANT Resistant     CEFEPIME <=1 SENSITIVE Sensitive     CEFTAZIDIME <=1 SENSITIVE Sensitive     CEFTRIAXONE <=1 SENSITIVE Sensitive     CIPROFLOXACIN <=0.25 SENSITIVE Sensitive     GENTAMICIN <=1 SENSITIVE Sensitive     IMIPENEM 0.5 SENSITIVE Sensitive     TRIMETH/SULFA <=20 SENSITIVE Sensitive     PIP/TAZO <=4 SENSITIVE Sensitive     * ENTEROBACTER CLOACAE  Blood Culture ID Panel (Reflexed)     Status: Abnormal   Collection Time: 06/12/17 11:43 PM  Result Value Ref Range Status   Enterococcus species NOT DETECTED NOT DETECTED Final   Listeria monocytogenes NOT DETECTED NOT DETECTED Final   Staphylococcus species NOT DETECTED NOT DETECTED Final   Staphylococcus aureus NOT DETECTED NOT DETECTED Final   Streptococcus species NOT DETECTED NOT DETECTED Final   Streptococcus  agalactiae NOT DETECTED NOT DETECTED Final   Streptococcus pneumoniae NOT DETECTED NOT DETECTED Final   Streptococcus pyogenes NOT DETECTED NOT DETECTED Final   Acinetobacter baumannii NOT DETECTED NOT DETECTED Final   Enterobacteriaceae species DETECTED (A) NOT DETECTED Final    Comment: Enterobacteriaceae represent a large family of gram-negative bacteria, not a single organism. CRITICAL RESULT CALLED  TO, READ BACK BY AND VERIFIED WITH: PHARMD J MILLEN [phone] MLM    Enterobacter cloacae complex DETECTED (A) NOT DETECTED Final    Comment: CRITICAL RESULT CALLED TO, READ BACK BY AND VERIFIED WITH: PHARMD J MILLEN [id number] MLM    Escherichia coli NOT DETECTED NOT DETECTED Final   Klebsiella oxytoca NOT DETECTED NOT DETECTED Final   Klebsiella pneumoniae NOT DETECTED NOT DETECTED Final   Proteus species NOT DETECTED NOT DETECTED Final   Serratia marcescens NOT DETECTED NOT DETECTED Final   Carbapenem resistance NOT DETECTED NOT DETECTED Final   Haemophilus influenzae NOT DETECTED NOT DETECTED Final   Neisseria meningitidis NOT DETECTED NOT DETECTED Final   Pseudomonas aeruginosa NOT DETECTED NOT DETECTED Final   Candida albicans NOT DETECTED NOT DETECTED Final   Candida glabrata NOT DETECTED NOT DETECTED Final   Candida krusei NOT DETECTED NOT DETECTED Final   Candida parapsilosis NOT DETECTED NOT DETECTED Final   Candida tropicalis NOT DETECTED NOT DETECTED Final  Culture, blood (routine x 2)     Status: Abnormal   Collection Time: 06/12/17 11:52 PM  Result Value Ref Range Status   Specimen Description BLOOD LEFT FOREARM  Final   Special Requests IN PEDIATRIC BOTTLE Blood Culture adequate volume  Final   Culture  Setup Time   Final    GRAM NEGATIVE RODS AEROBIC BOTTLE ONLY CRITICAL VALUE NOTED.  VALUE IS CONSISTENT WITH PREVIOUSLY REPORTED AND CALLED VALUE.    Culture (A)  Final    ENTEROBACTER CLOACAE SUSCEPTIBILITIES PERFORMED ON PREVIOUS CULTURE WITHIN THE LAST 5  DAYS.    Report Status 06/15/2017 FINAL  Final  C difficile quick scan w PCR reflex     Status: None   Collection Time: 06/16/17 12:22 PM  Result Value Ref Range Status   C Diff antigen NEGATIVE NEGATIVE Final   C Diff toxin NEGATIVE NEGATIVE Final   C Diff interpretation No C. difficile detected.  Final     Labs: Basic Metabolic Panel: Recent Labs  Lab 06/11/17 0805 06/12/17 0621 06/13/17 0351  NA 137 134* 138  K 3.7 4.1 3.8  CL 98* 95* 100*  CO2 29 29 29   GLUCOSE 121* 129* 161*  BUN 23* 26* 31*  CREATININE 0.69 0.74 0.89  CALCIUM 9.0 9.1 8.8*   Liver Function Tests: No results for input(s): AST, ALT, ALKPHOS, BILITOT, PROT, ALBUMIN in the last 168 hours. No results for input(s): LIPASE, AMYLASE in the last 168 hours. No results for input(s): AMMONIA in the last 168 hours. CBC: Recent Labs  Lab 06/12/17 0621 06/13/17 0351 06/13/17 2330 06/14/17 0857 06/16/17 0019  WBC 14.9* 26.4* 30.9* 28.8* 24.3*  HGB 13.7 11.6* 11.5* 12.4 13.3  HCT 41.9 37.2 36.4 38.9 41.3  MCV 93.9 93.2 94.8 94.4 92.2  PLT 237 205 215 240 304   Cardiac Enzymes: No results for input(s): CKTOTAL, CKMB, CKMBINDEX, TROPONINI in the last 168 hours. BNP: BNP (last 3 results) No results for input(s): BNP in the last 8760 hours.  ProBNP (last 3 results) No results for input(s): PROBNP in the last 8760 hours.  CBG: Recent Labs  Lab 06/15/17 0610  GLUCAP 135*    Signed:  Velvet Bathe MD.  [facility] 06/17/2017, 9:47 AM

## 2017-06-17 NOTE — Progress Notes (Signed)
Pt discharged via stretcher with transporters to SNF. Daughter has all pt belongings. Paperwork including DNR given to transporters. Ativan 1mg  given to patient prior to d.c per MD order. VSS. BP (!) 146/62 (BP Location: Left Arm)   Pulse 87   Temp 97.7 F (36.5 C) (Oral)   Resp 20   Ht 5' (1.524 m)   Wt 53.2 kg (117 lb 4.6 oz)   LMP 06/11/1976   SpO2 98%   BMI 22.91 kg/m

## 2017-06-17 NOTE — Care Management Note (Signed)
Case Management Note  Patient Details  Name: Lori Boone MRN: [id number] Date of Birth: [birth year]  Subjective/Objective:                    Action/Plan: Pt discharging to [facility]. No further needs per CM.    Expected Discharge Date:  06/17/17               Expected Discharge Plan:  Skilled Nursing Facility  In-House Referral:  Clinical Social Work  Discharge planning Services  CM Consult  Post Acute Care Choice:    Choice offered to:     DME Arranged:    DME Agency:     HH Arranged:    [place] Agency:     Status of Service:  Completed, signed off  If discussed at [employer], dates discussed:    Additional Comments:  Pollie Friar, RN 06/17/2017, 10:49 AM

## 2017-06-17 NOTE — Progress Notes (Signed)
Patient will discharge to [facility] Anticipated discharge date: 1/7 Family notified: pt dtr at bedside Transportation by [facility]- scheduled for 11am  CSW signing off.  Jorge Ny, LCSW Clinical Social Worker [phone]

## 2017-06-17 NOTE — Progress Notes (Signed)
SLP Cancellation Note  Patient Details Name: Lori Boone MRN: [id number] DOB: Nov 05, 1925   Cancelled treatment:       Reason Eval/Treat Not Completed: Patient at procedure or test/unavailable. Attempted x2. Pt observed to pocket food, has since been downgraded to puree texture. Recommend f/u with SLP at next level of care if pt d/c today.    Azra Abrell, Katherene Ponto 06/17/2017, 10:54 AM

## 2017-06-17 NOTE — Progress Notes (Signed)
Physical Therapy Treatment Patient Details Name: Lori Boone MRN: [id number] DOB: Oct 08, 1925 Today's Date: 06/17/2017    History of Present Illness 54 fem with HTN, simple ovarian cysts stable since 2017, prior near syncope 2013 with no cause, progressive dementia over last year, reflux.  Pt sustained probable mech fall resulting in SDH and Subarachnoid hemorrhage, fractures of face and parietal and occipital bones.     PT Comments    Patient progressing well towards PT goals. Tolerated standing and gait training with Min-mod A for balance/safety. Pt continues to demonstrate unsteadiness and instability during ambulation requiring hands on assist. Sp02 90% on 2L.min 02 and HR ranged from 90s-123 bpm during session. Pt incontinent of urine upon standing-continue to recommend using pampers for mobility.  Plans to d/c to SNF today. Will follow.   Follow Up Recommendations  SNF;Supervision/Assistance - 24 hour     Equipment Recommendations  None recommended by PT    Recommendations for Other Services       Precautions / Restrictions Precautions Precautions: Fall Precaution Comments: soft collar orders discontinued 1/1 Restrictions Weight Bearing Restrictions: No    Mobility  Bed Mobility Overal bed mobility: Needs Assistance Bed Mobility: Rolling;Sidelying to Sit;Sit to Sidelying Rolling: Min guard Sidelying to sit: Mod assist;HOB elevated     Sit to sidelying: Mod assist;HOB elevated General bed mobility comments: Able to roll onto right side using rail, assist to bring LEs off bed and to elevate trunk. Assist to bring LEs into bed to return to supine. Able to bridge to remove pampers.   Transfers Overall transfer level: Needs assistance Equipment used: Rolling walker (2 wheeled) Transfers: Sit to/from Stand Sit to Stand: Mod assist         General transfer comment: Assist to power to standing from EOB with cues for use of body momentum. Stood from EOB x3, with cues  for hip extension as pt in flexed posture. Incontinent of urine upon standing.   Ambulation/Gait Ambulation/Gait assistance: Min assist;+2 safety/equipment Ambulation Distance (Feet): 200 Feet Assistive device: Rolling walker (2 wheeled) Gait Pattern/deviations: Step-through pattern;Decreased stride length;Drifts right/left;Trunk flexed;Wide base of support;Staggering right;Shuffle Gait velocity: decreased   General Gait Details: Slow, mildly unsteady gait with cues for RW management and upright posture; 2/4 DOE. Sp02 90% on 2L/min 02. HR 90s-123 bpm during session.   Stairs            Wheelchair Mobility    Modified Rankin (Stroke Patients Only)       Balance Overall balance assessment: Needs assistance Sitting-balance support: Feet supported;Single extremity supported Sitting balance-Leahy Scale: Fair Sitting balance - Comments: Able to sit EOB and take pills; requires UE support when moving bed to clear room for chair.   Standing balance support: Bilateral upper extremity supported;During functional activity Standing balance-Leahy Scale: Poor Standing balance comment: relies on BUE external support with use of RW and external support in standing to pull up pamper. Pt falling back onto bed x2 without support and leaning heavily on RW.                            Cognition Arousal/Alertness: Awake/alert Behavior During Therapy: WFL for tasks assessed/performed Overall Cognitive Status: Impaired/Different from baseline Area of Impairment: Orientation;Memory;Problem solving;Following commands;Safety/judgement                 Orientation Level: Disoriented to;Time;Situation   Memory: Decreased short-term memory;Decreased recall of precautions Following Commands: Follows one step commands with increased  time Safety/Judgement: Decreased awareness of safety;Decreased awareness of deficits Awareness: Emergent Problem Solving: Slow processing;Requires verbal  cues;Difficulty sequencing General Comments: Smiles appropriately. Low voice like a whisper. Increased time to perform all movement with multimodal cues. Able to initiate better today. Knows she is in [employer].      Exercises      General Comments General comments (skin integrity, edema, etc.): Daughter present during session. Sp02 90% on 2L/min 02. HR ranged from 90s-123 bpm.      Pertinent Vitals/Pain Pain Assessment: Faces Faces Pain Scale: No hurt    Home Living                      Prior Function            PT Goals (current goals can now be found in the care plan section) Progress towards PT goals: Progressing toward goals    Frequency    Min 3X/week      PT Plan Current plan remains appropriate    Co-evaluation              AM-PAC PT "6 Clicks" Daily Activity  Outcome Measure  Difficulty turning over in bed (including adjusting bedclothes, sheets and blankets)?: None Difficulty moving from lying on back to sitting on the side of the bed? : Unable Difficulty sitting down on and standing up from a chair with arms (e.g., wheelchair, bedside commode, etc,.)?: Unable Help needed moving to and from a bed to chair (including a wheelchair)?: A Little Help needed walking in hospital room?: A Little Help needed climbing 3-5 steps with a railing? : A Lot 6 Click Score: 14    End of Session Equipment Utilized During Treatment: Gait belt;Oxygen Activity Tolerance: Patient tolerated treatment well Patient left: in bed;with call bell/phone within reach;with nursing/sitter in room;with family/visitor present Nurse Communication: Mobility status PT Visit Diagnosis: Unsteadiness on feet (R26.81);Muscle weakness (generalized) (M62.81)     Time: 3244-0102 PT Time Calculation (min) (ACUTE ONLY): 27 min  Charges:  $Gait Training: 8-22 mins $Therapeutic Activity: 8-22 mins                    G Codes:       Wray Kearns, PT,  DPT [phone]     Lori Boone 06/17/2017, 10:44 AM

## 2017-06-17 NOTE — Clinical Social Work Placement (Signed)
   CLINICAL SOCIAL WORK PLACEMENT  NOTE  Date:  06/17/2017  Patient Details  Name: Lori Boone MRN: [id number] Date of Birth: 1925-11-12  Clinical Social Work is seeking post-discharge placement for this patient at the [place] level of care (*CSW will initial, date and re-position this form in  chart as items are completed):  Yes   Patient/family provided with [place] Work Department's list of facilities offering this level of care within the geographic area requested by the patient (or if unable, by the patient's family).  Yes   Patient/family informed of their freedom to choose among providers that offer the needed level of care, that participate in Medicare, Medicaid or managed care program needed by the patient, have an available bed and are willing to accept the patient.  Yes   Patient/family informed of [facility]'s ownership interest in [facility] and [facility], as well as of the fact that they are under no obligation to receive care at these facilities.  PASRR submitted to EDS on 06/12/17     PASRR number received on 06/12/17     Existing PASRR number confirmed on       FL2 transmitted to all facilities in geographic area requested by pt/family on 06/12/17     FL2 transmitted to all facilities within larger geographic area on       Patient informed that his/her managed care company has contracts with or will negotiate with certain facilities, including the following:        Yes   Patient/family informed of bed offers received.  Patient chooses bed at [facility])     Physician recommends and patient chooses bed at      Patient to be transferred to [facility]) on 06/17/17.  Patient to be transferred to facility by ptar     Patient family notified on 06/17/17 of transfer.  Name of family member notified:  Lendell Caprice     PHYSICIAN Please sign FL2, Please sign DNR     Additional Comment:     _______________________________________________ Jorge Ny, LCSW 06/17/2017, 10:32 AM

## 2017-06-17 NOTE — Progress Notes (Signed)
Report called to nurse at [facility] in [place].

## 2017-06-18 DIAGNOSIS — R7881 Bacteremia: Secondary | ICD-10-CM | POA: Diagnosis not present

## 2017-06-18 DIAGNOSIS — B9689 Other specified bacterial agents as the cause of diseases classified elsewhere: Secondary | ICD-10-CM | POA: Diagnosis not present

## 2017-06-18 DIAGNOSIS — S0291XD Unspecified fracture of skull, subsequent encounter for fracture with routine healing: Secondary | ICD-10-CM | POA: Diagnosis not present

## 2017-06-18 DIAGNOSIS — I609 Nontraumatic subarachnoid hemorrhage, unspecified: Secondary | ICD-10-CM | POA: Diagnosis not present

## 2017-06-20 DIAGNOSIS — R7881 Bacteremia: Secondary | ICD-10-CM | POA: Diagnosis not present

## 2017-06-20 DIAGNOSIS — I6200 Nontraumatic subdural hemorrhage, unspecified: Secondary | ICD-10-CM | POA: Diagnosis not present

## 2017-06-20 DIAGNOSIS — F0390 Unspecified dementia without behavioral disturbance: Secondary | ICD-10-CM | POA: Diagnosis not present

## 2017-06-20 DIAGNOSIS — I609 Nontraumatic subarachnoid hemorrhage, unspecified: Secondary | ICD-10-CM | POA: Diagnosis not present

## 2017-06-25 DIAGNOSIS — D72829 Elevated white blood cell count, unspecified: Secondary | ICD-10-CM | POA: Diagnosis not present

## 2017-06-25 DIAGNOSIS — K5900 Constipation, unspecified: Secondary | ICD-10-CM | POA: Diagnosis not present

## 2017-06-25 DIAGNOSIS — R339 Retention of urine, unspecified: Secondary | ICD-10-CM | POA: Diagnosis not present

## 2017-06-25 DIAGNOSIS — G4700 Insomnia, unspecified: Secondary | ICD-10-CM | POA: Diagnosis not present

## 2017-07-04 DIAGNOSIS — F0390 Unspecified dementia without behavioral disturbance: Secondary | ICD-10-CM | POA: Diagnosis not present

## 2017-07-04 DIAGNOSIS — R451 Restlessness and agitation: Secondary | ICD-10-CM | POA: Diagnosis not present

## 2017-07-04 DIAGNOSIS — I609 Nontraumatic subarachnoid hemorrhage, unspecified: Secondary | ICD-10-CM | POA: Diagnosis not present

## 2017-07-09 DIAGNOSIS — D649 Anemia, unspecified: Secondary | ICD-10-CM | POA: Diagnosis not present

## 2017-07-09 DIAGNOSIS — E871 Hypo-osmolality and hyponatremia: Secondary | ICD-10-CM | POA: Diagnosis not present

## 2017-07-09 DIAGNOSIS — D72829 Elevated white blood cell count, unspecified: Secondary | ICD-10-CM | POA: Diagnosis not present

## 2017-07-11 DIAGNOSIS — F0390 Unspecified dementia without behavioral disturbance: Secondary | ICD-10-CM | POA: Diagnosis not present

## 2017-07-11 DIAGNOSIS — G4700 Insomnia, unspecified: Secondary | ICD-10-CM | POA: Diagnosis not present

## 2017-07-17 DIAGNOSIS — D649 Anemia, unspecified: Secondary | ICD-10-CM | POA: Diagnosis not present

## 2017-07-18 DIAGNOSIS — F0390 Unspecified dementia without behavioral disturbance: Secondary | ICD-10-CM | POA: Diagnosis not present

## 2017-07-18 DIAGNOSIS — D649 Anemia, unspecified: Secondary | ICD-10-CM | POA: Diagnosis not present

## 2017-08-10 DIAGNOSIS — M1000 Idiopathic gout, unspecified site: Secondary | ICD-10-CM | POA: Diagnosis not present

## 2017-08-10 DIAGNOSIS — M25572 Pain in left ankle and joints of left foot: Secondary | ICD-10-CM | POA: Diagnosis not present

## 2017-08-15 DIAGNOSIS — M25473 Effusion, unspecified ankle: Secondary | ICD-10-CM | POA: Diagnosis not present

## 2017-08-15 DIAGNOSIS — L539 Erythematous condition, unspecified: Secondary | ICD-10-CM | POA: Diagnosis not present

## 2017-08-15 DIAGNOSIS — F0390 Unspecified dementia without behavioral disturbance: Secondary | ICD-10-CM | POA: Diagnosis not present

## 2017-08-22 DIAGNOSIS — Z960 Presence of urogenital implants: Secondary | ICD-10-CM | POA: Diagnosis not present

## 2017-08-22 DIAGNOSIS — N319 Neuromuscular dysfunction of bladder, unspecified: Secondary | ICD-10-CM | POA: Diagnosis not present

## 2017-08-22 DIAGNOSIS — R339 Retention of urine, unspecified: Secondary | ICD-10-CM | POA: Diagnosis not present

## 2017-09-03 ENCOUNTER — Encounter: Payer: Self-pay | Admitting: Obstetrics & Gynecology

## 2017-09-03 ENCOUNTER — Ambulatory Visit: Payer: Medicare Other | Admitting: Obstetrics & Gynecology

## 2017-09-03 NOTE — Progress Notes (Deleted)
84 y.o. Lori Boone WidowedCaucasianF here for annual exam.    Patient's last menstrual period was 06/11/1976.          Sexually active: {yes no:314532}  The current method of family planning is post menopausal status.    Exercising: {yes no:314532}  {types:19826} Smoker:  {YES NO:22349}  Health Maintenance: Pap:  04/19/15 Neg   12/30/12 neg  History of abnormal Pap:  no MMG:  02/07/17 BIRADS1:Neg  Colonoscopy:  2014 Dr. Earlean Shawl  BMD:   09/01/08 Osteopenia  TDaP:  *** Pneumonia vaccine(s):  *** Shingrix:   *** Hep C testing: *** Screening Labs: ***, Hb today: ***, Urine today: ***   reports that she has quit smoking. She has never used smokeless tobacco. She reports that she drinks about 1.8 oz of alcohol per week. She reports that she does not use drugs.  Past Medical History:  Diagnosis Date  . Broken ankle   . Diverticulitis   . Hyperlipidemia   . Hypertension   . Mastodynia   . Meniscus tear    trying cortisone injections with Dr Ronnie Derby, right leg  . Osteopenia   . Ovarian cyst    h/o  . PAC (premature atrial contraction)   . Stomach ulcer 11/10/12   dx with endoscopy, will repeat in 8/14 and will stretch esophagus    Past Surgical History:  Procedure Laterality Date  . ANKLE SURGERY  1987  . [place]  . cataracts    . MOUTH SURGERY     implants    Current Outpatient Medications  Medication Sig Dispense Refill  . acetaminophen (TYLENOL) 325 MG tablet Take 2 tablets (650 mg total) by mouth every 6 (six) hours as needed for mild pain (or Fever >/= 101).    Marland Kitchen atorvastatin (LIPITOR) 20 MG tablet Take 20 mg by mouth daily at 6 PM.    . calcium carbonate (TUMS EX) 750 MG chewable tablet Chew 2 tablets by mouth as needed for heartburn.    . calcium-vitamin D (OSCAL WITH D) 500-200 MG-UNIT per tablet Take 1 tablet by mouth 2 (two) times daily.    . cholecalciferol (VITAMIN D) 1000 UNITS tablet Take 1,000 Units by mouth daily at 6 PM.     . docusate sodium (COLACE) 100  MG capsule Take 100 mg by mouth as needed for mild constipation.    . magnesium gluconate (MAGONATE) 500 MG tablet Take 500 mg by mouth 2 (two) times daily.    Marland Kitchen omeprazole (PRILOSEC) 40 MG capsule Take 40 mg by mouth every morning.     Vladimir Faster Glycol-Propyl Glycol (SYSTANE OP) Place 1 drop into both eyes daily as needed (for dry eyes).    . Probiotic Product (PROBIOTIC DAILY PO) Take 1 tablet by mouth every morning.    . sodium chloride (OCEAN) 0.65 % SOLN nasal spray Place 1 spray into both nostrils daily at 6 PM.    . triamcinolone (NASACORT ALLERGY 24HR) 55 MCG/ACT AERO nasal inhaler Place 2 sprays into both nostrils 2 (two) times daily as needed (congestion).     . triamterene-hydrochlorothiazide (MAXZIDE) 75-50 MG per tablet Take 1 tablet by mouth daily.      . verapamil (CALAN-SR) 240 MG CR tablet Take 240 mg by mouth daily.      No current facility-administered medications for this visit.     Family History  Problem Relation Age of Onset  . Coronary artery disease Father   . Heart attack Father   . Heart failure  Mother   . Colon cancer Brother     ROS  Exam:   LMP 06/11/1976   Height:      Ht Readings from Last 3 Encounters:  06/14/17 5' (1.524 m)  05/21/16 4' 11.5" (1.511 m)  05/17/15 4' 11.5" (1.511 m)    General appearance: alert, cooperative and appears stated age Head: Normocephalic, without obvious abnormality, atraumatic Neck: no adenopathy, supple, symmetrical, trachea midline and thyroid {EXAM; THYROID:18604} Lungs: clear to auscultation bilaterally Breasts: {Exam; breast:13139::"normal appearance, no masses or tenderness"} Heart: regular rate and rhythm Abdomen: soft, non-tender; bowel sounds normal; no masses,  no organomegaly Extremities: extremities normal, atraumatic, no cyanosis or edema Skin: Skin color, texture, turgor normal. No rashes or lesions Lymph nodes: Cervical, supraclavicular, and axillary nodes normal. No abnormal inguinal nodes  palpated Neurologic: Grossly normal   Pelvic: External genitalia:  no lesions              Urethra:  normal appearing urethra with no masses, tenderness or lesions              Bartholins and Skenes: normal                 Vagina: normal appearing vagina with normal color and discharge, no lesions              Cervix: {exam; cervix:14595}              Pap taken: {yes no:314532} Bimanual Exam:  Uterus:  {exam; uterus:12215}              Adnexa: {exam; adnexa:12223}               Rectovaginal: Confirms               Anus:  normal sphincter tone, no lesions  Chaperone was present for exam.  A:  Well Woman with normal exam  P:   {plan; gyn:5269::"mammogram","pap smear","return annually or prn"}

## 2017-09-09 DIAGNOSIS — S069X9A Unspecified intracranial injury with loss of consciousness of unspecified duration, initial encounter: Secondary | ICD-10-CM | POA: Diagnosis not present

## 2017-09-09 DIAGNOSIS — S065X9A Traumatic subdural hemorrhage with loss of consciousness of unspecified duration, initial encounter: Secondary | ICD-10-CM | POA: Diagnosis not present

## 2017-09-09 DIAGNOSIS — G301 Alzheimer's disease with late onset: Secondary | ICD-10-CM | POA: Diagnosis not present

## 2017-09-18 ENCOUNTER — Telehealth: Payer: Self-pay | Admitting: Obstetrics and Gynecology

## 2017-09-18 NOTE — Telephone Encounter (Signed)
Erroneous encounter

## 2017-10-07 DIAGNOSIS — E871 Hypo-osmolality and hyponatremia: Secondary | ICD-10-CM | POA: Diagnosis not present

## 2017-10-08 DIAGNOSIS — M6281 Muscle weakness (generalized): Secondary | ICD-10-CM | POA: Diagnosis not present

## 2017-10-08 DIAGNOSIS — R2689 Other abnormalities of gait and mobility: Secondary | ICD-10-CM | POA: Diagnosis not present

## 2017-10-08 DIAGNOSIS — Z79899 Other long term (current) drug therapy: Secondary | ICD-10-CM | POA: Diagnosis not present

## 2017-10-08 DIAGNOSIS — E876 Hypokalemia: Secondary | ICD-10-CM | POA: Diagnosis not present

## 2017-10-08 DIAGNOSIS — F0390 Unspecified dementia without behavioral disturbance: Secondary | ICD-10-CM | POA: Diagnosis not present

## 2017-10-10 DIAGNOSIS — F0390 Unspecified dementia without behavioral disturbance: Secondary | ICD-10-CM | POA: Diagnosis not present

## 2017-10-10 DIAGNOSIS — R2689 Other abnormalities of gait and mobility: Secondary | ICD-10-CM | POA: Diagnosis not present

## 2017-10-10 DIAGNOSIS — M6281 Muscle weakness (generalized): Secondary | ICD-10-CM | POA: Diagnosis not present

## 2017-10-14 DIAGNOSIS — F0390 Unspecified dementia without behavioral disturbance: Secondary | ICD-10-CM | POA: Diagnosis not present

## 2017-10-14 DIAGNOSIS — R2689 Other abnormalities of gait and mobility: Secondary | ICD-10-CM | POA: Diagnosis not present

## 2017-10-14 DIAGNOSIS — M6281 Muscle weakness (generalized): Secondary | ICD-10-CM | POA: Diagnosis not present

## 2017-10-16 DIAGNOSIS — R2689 Other abnormalities of gait and mobility: Secondary | ICD-10-CM | POA: Diagnosis not present

## 2017-10-16 DIAGNOSIS — F0390 Unspecified dementia without behavioral disturbance: Secondary | ICD-10-CM | POA: Diagnosis not present

## 2017-10-16 DIAGNOSIS — M6281 Muscle weakness (generalized): Secondary | ICD-10-CM | POA: Diagnosis not present

## 2017-10-17 DIAGNOSIS — M6281 Muscle weakness (generalized): Secondary | ICD-10-CM | POA: Diagnosis not present

## 2017-10-17 DIAGNOSIS — R2689 Other abnormalities of gait and mobility: Secondary | ICD-10-CM | POA: Diagnosis not present

## 2017-10-17 DIAGNOSIS — F0390 Unspecified dementia without behavioral disturbance: Secondary | ICD-10-CM | POA: Diagnosis not present

## 2017-10-18 DIAGNOSIS — M6281 Muscle weakness (generalized): Secondary | ICD-10-CM | POA: Diagnosis not present

## 2017-10-18 DIAGNOSIS — R2689 Other abnormalities of gait and mobility: Secondary | ICD-10-CM | POA: Diagnosis not present

## 2017-10-18 DIAGNOSIS — F0390 Unspecified dementia without behavioral disturbance: Secondary | ICD-10-CM | POA: Diagnosis not present

## 2017-10-22 DIAGNOSIS — M6281 Muscle weakness (generalized): Secondary | ICD-10-CM | POA: Diagnosis not present

## 2017-10-22 DIAGNOSIS — R2689 Other abnormalities of gait and mobility: Secondary | ICD-10-CM | POA: Diagnosis not present

## 2017-10-22 DIAGNOSIS — F0390 Unspecified dementia without behavioral disturbance: Secondary | ICD-10-CM | POA: Diagnosis not present

## 2017-10-23 DIAGNOSIS — R2689 Other abnormalities of gait and mobility: Secondary | ICD-10-CM | POA: Diagnosis not present

## 2017-10-23 DIAGNOSIS — F0390 Unspecified dementia without behavioral disturbance: Secondary | ICD-10-CM | POA: Diagnosis not present

## 2017-10-23 DIAGNOSIS — M6281 Muscle weakness (generalized): Secondary | ICD-10-CM | POA: Diagnosis not present

## 2017-10-24 DIAGNOSIS — R2689 Other abnormalities of gait and mobility: Secondary | ICD-10-CM | POA: Diagnosis not present

## 2017-10-24 DIAGNOSIS — F0390 Unspecified dementia without behavioral disturbance: Secondary | ICD-10-CM | POA: Diagnosis not present

## 2017-10-24 DIAGNOSIS — M6281 Muscle weakness (generalized): Secondary | ICD-10-CM | POA: Diagnosis not present

## 2017-10-31 DIAGNOSIS — M6281 Muscle weakness (generalized): Secondary | ICD-10-CM | POA: Diagnosis not present

## 2017-10-31 DIAGNOSIS — F0390 Unspecified dementia without behavioral disturbance: Secondary | ICD-10-CM | POA: Diagnosis not present

## 2017-10-31 DIAGNOSIS — R2689 Other abnormalities of gait and mobility: Secondary | ICD-10-CM | POA: Diagnosis not present

## 2017-11-01 DIAGNOSIS — R2689 Other abnormalities of gait and mobility: Secondary | ICD-10-CM | POA: Diagnosis not present

## 2017-11-01 DIAGNOSIS — M6281 Muscle weakness (generalized): Secondary | ICD-10-CM | POA: Diagnosis not present

## 2017-11-01 DIAGNOSIS — F0390 Unspecified dementia without behavioral disturbance: Secondary | ICD-10-CM | POA: Diagnosis not present

## 2017-11-04 DIAGNOSIS — R2689 Other abnormalities of gait and mobility: Secondary | ICD-10-CM | POA: Diagnosis not present

## 2017-11-04 DIAGNOSIS — M6281 Muscle weakness (generalized): Secondary | ICD-10-CM | POA: Diagnosis not present

## 2017-11-04 DIAGNOSIS — F0390 Unspecified dementia without behavioral disturbance: Secondary | ICD-10-CM | POA: Diagnosis not present

## 2017-11-05 DIAGNOSIS — M6281 Muscle weakness (generalized): Secondary | ICD-10-CM | POA: Diagnosis not present

## 2017-11-05 DIAGNOSIS — Z79899 Other long term (current) drug therapy: Secondary | ICD-10-CM | POA: Diagnosis not present

## 2017-11-05 DIAGNOSIS — D649 Anemia, unspecified: Secondary | ICD-10-CM | POA: Diagnosis not present

## 2017-11-05 DIAGNOSIS — R2689 Other abnormalities of gait and mobility: Secondary | ICD-10-CM | POA: Diagnosis not present

## 2017-11-05 DIAGNOSIS — F0390 Unspecified dementia without behavioral disturbance: Secondary | ICD-10-CM | POA: Diagnosis not present

## 2017-11-06 DIAGNOSIS — M6281 Muscle weakness (generalized): Secondary | ICD-10-CM | POA: Diagnosis not present

## 2017-11-06 DIAGNOSIS — R2689 Other abnormalities of gait and mobility: Secondary | ICD-10-CM | POA: Diagnosis not present

## 2017-11-06 DIAGNOSIS — F0390 Unspecified dementia without behavioral disturbance: Secondary | ICD-10-CM | POA: Diagnosis not present

## 2017-11-07 DIAGNOSIS — E785 Hyperlipidemia, unspecified: Secondary | ICD-10-CM | POA: Diagnosis not present

## 2017-11-07 DIAGNOSIS — I10 Essential (primary) hypertension: Secondary | ICD-10-CM | POA: Diagnosis not present

## 2017-11-07 DIAGNOSIS — F419 Anxiety disorder, unspecified: Secondary | ICD-10-CM | POA: Diagnosis not present

## 2017-11-07 DIAGNOSIS — F0390 Unspecified dementia without behavioral disturbance: Secondary | ICD-10-CM | POA: Diagnosis not present

## 2017-11-11 DIAGNOSIS — E876 Hypokalemia: Secondary | ICD-10-CM | POA: Diagnosis not present

## 2017-11-12 DIAGNOSIS — E876 Hypokalemia: Secondary | ICD-10-CM | POA: Diagnosis not present

## 2017-12-04 DIAGNOSIS — H04123 Dry eye syndrome of bilateral lacrimal glands: Secondary | ICD-10-CM | POA: Diagnosis not present

## 2018-01-02 DIAGNOSIS — E559 Vitamin D deficiency, unspecified: Secondary | ICD-10-CM | POA: Diagnosis not present

## 2018-01-02 DIAGNOSIS — G301 Alzheimer's disease with late onset: Secondary | ICD-10-CM | POA: Diagnosis not present

## 2018-01-02 DIAGNOSIS — F0280 Dementia in other diseases classified elsewhere without behavioral disturbance: Secondary | ICD-10-CM | POA: Diagnosis not present

## 2018-01-02 DIAGNOSIS — K5909 Other constipation: Secondary | ICD-10-CM | POA: Diagnosis not present

## 2018-01-23 DIAGNOSIS — M79675 Pain in left toe(s): Secondary | ICD-10-CM | POA: Diagnosis not present

## 2018-01-23 DIAGNOSIS — B351 Tinea unguium: Secondary | ICD-10-CM | POA: Diagnosis not present

## 2018-02-13 DIAGNOSIS — F419 Anxiety disorder, unspecified: Secondary | ICD-10-CM | POA: Diagnosis not present

## 2018-02-13 DIAGNOSIS — G301 Alzheimer's disease with late onset: Secondary | ICD-10-CM | POA: Diagnosis not present

## 2018-02-14 DIAGNOSIS — Z79899 Other long term (current) drug therapy: Secondary | ICD-10-CM | POA: Diagnosis not present

## 2018-02-18 DIAGNOSIS — E876 Hypokalemia: Secondary | ICD-10-CM | POA: Diagnosis not present

## 2018-02-18 DIAGNOSIS — D649 Anemia, unspecified: Secondary | ICD-10-CM | POA: Diagnosis not present

## 2018-03-06 DIAGNOSIS — G301 Alzheimer's disease with late onset: Secondary | ICD-10-CM | POA: Diagnosis not present

## 2018-03-06 DIAGNOSIS — K219 Gastro-esophageal reflux disease without esophagitis: Secondary | ICD-10-CM | POA: Diagnosis not present

## 2018-03-06 DIAGNOSIS — I10 Essential (primary) hypertension: Secondary | ICD-10-CM | POA: Diagnosis not present

## 2018-03-07 DIAGNOSIS — F329 Major depressive disorder, single episode, unspecified: Secondary | ICD-10-CM | POA: Diagnosis not present

## 2018-03-07 DIAGNOSIS — F0391 Unspecified dementia with behavioral disturbance: Secondary | ICD-10-CM | POA: Diagnosis not present

## 2018-03-07 DIAGNOSIS — F411 Generalized anxiety disorder: Secondary | ICD-10-CM | POA: Diagnosis not present

## 2018-03-10 DIAGNOSIS — F411 Generalized anxiety disorder: Secondary | ICD-10-CM | POA: Diagnosis not present

## 2018-03-10 DIAGNOSIS — F0391 Unspecified dementia with behavioral disturbance: Secondary | ICD-10-CM | POA: Diagnosis not present

## 2018-03-19 DIAGNOSIS — Z5181 Encounter for therapeutic drug level monitoring: Secondary | ICD-10-CM | POA: Diagnosis not present

## 2018-03-19 DIAGNOSIS — Z79899 Other long term (current) drug therapy: Secondary | ICD-10-CM | POA: Diagnosis not present

## 2018-03-20 DIAGNOSIS — I10 Essential (primary) hypertension: Secondary | ICD-10-CM | POA: Diagnosis not present

## 2018-03-20 DIAGNOSIS — E876 Hypokalemia: Secondary | ICD-10-CM | POA: Diagnosis not present

## 2018-03-25 DIAGNOSIS — R600 Localized edema: Secondary | ICD-10-CM | POA: Diagnosis not present

## 2018-03-25 DIAGNOSIS — H109 Unspecified conjunctivitis: Secondary | ICD-10-CM | POA: Diagnosis not present

## 2018-03-27 DIAGNOSIS — F0280 Dementia in other diseases classified elsewhere without behavioral disturbance: Secondary | ICD-10-CM | POA: Diagnosis not present

## 2018-03-27 DIAGNOSIS — R600 Localized edema: Secondary | ICD-10-CM | POA: Diagnosis not present

## 2018-03-27 DIAGNOSIS — G301 Alzheimer's disease with late onset: Secondary | ICD-10-CM | POA: Diagnosis not present

## 2018-04-18 DIAGNOSIS — F329 Major depressive disorder, single episode, unspecified: Secondary | ICD-10-CM | POA: Diagnosis not present

## 2018-04-18 DIAGNOSIS — F0391 Unspecified dementia with behavioral disturbance: Secondary | ICD-10-CM | POA: Diagnosis not present

## 2018-04-18 DIAGNOSIS — F411 Generalized anxiety disorder: Secondary | ICD-10-CM | POA: Diagnosis not present

## 2018-04-24 DIAGNOSIS — I10 Essential (primary) hypertension: Secondary | ICD-10-CM | POA: Diagnosis not present

## 2018-04-24 DIAGNOSIS — G301 Alzheimer's disease with late onset: Secondary | ICD-10-CM | POA: Diagnosis not present

## 2018-04-24 DIAGNOSIS — R600 Localized edema: Secondary | ICD-10-CM | POA: Diagnosis not present

## 2018-04-24 DIAGNOSIS — F0280 Dementia in other diseases classified elsewhere without behavioral disturbance: Secondary | ICD-10-CM | POA: Diagnosis not present

## 2018-05-29 DIAGNOSIS — B351 Tinea unguium: Secondary | ICD-10-CM | POA: Diagnosis not present

## 2018-05-29 DIAGNOSIS — M79675 Pain in left toe(s): Secondary | ICD-10-CM | POA: Diagnosis not present

## 2018-05-29 DIAGNOSIS — M79674 Pain in right toe(s): Secondary | ICD-10-CM | POA: Diagnosis not present

## 2018-06-19 DIAGNOSIS — E559 Vitamin D deficiency, unspecified: Secondary | ICD-10-CM | POA: Diagnosis not present

## 2018-06-19 DIAGNOSIS — G301 Alzheimer's disease with late onset: Secondary | ICD-10-CM | POA: Diagnosis not present

## 2018-06-19 DIAGNOSIS — K5909 Other constipation: Secondary | ICD-10-CM | POA: Diagnosis not present

## 2018-06-19 DIAGNOSIS — F0280 Dementia in other diseases classified elsewhere without behavioral disturbance: Secondary | ICD-10-CM | POA: Diagnosis not present

## 2018-06-20 DIAGNOSIS — F411 Generalized anxiety disorder: Secondary | ICD-10-CM | POA: Diagnosis not present

## 2018-06-20 DIAGNOSIS — F329 Major depressive disorder, single episode, unspecified: Secondary | ICD-10-CM | POA: Diagnosis not present

## 2018-06-20 DIAGNOSIS — F0391 Unspecified dementia with behavioral disturbance: Secondary | ICD-10-CM | POA: Diagnosis not present

## 2018-06-26 DIAGNOSIS — F0390 Unspecified dementia without behavioral disturbance: Secondary | ICD-10-CM | POA: Diagnosis not present

## 2018-06-26 DIAGNOSIS — I6200 Nontraumatic subdural hemorrhage, unspecified: Secondary | ICD-10-CM | POA: Diagnosis not present

## 2018-06-26 DIAGNOSIS — K219 Gastro-esophageal reflux disease without esophagitis: Secondary | ICD-10-CM | POA: Diagnosis not present

## 2018-06-26 DIAGNOSIS — I10 Essential (primary) hypertension: Secondary | ICD-10-CM | POA: Diagnosis not present

## 2018-07-18 DIAGNOSIS — F411 Generalized anxiety disorder: Secondary | ICD-10-CM | POA: Diagnosis not present

## 2018-07-18 DIAGNOSIS — F0391 Unspecified dementia with behavioral disturbance: Secondary | ICD-10-CM | POA: Diagnosis not present

## 2018-07-18 DIAGNOSIS — F329 Major depressive disorder, single episode, unspecified: Secondary | ICD-10-CM | POA: Diagnosis not present

## 2018-07-22 DIAGNOSIS — M159 Polyosteoarthritis, unspecified: Secondary | ICD-10-CM | POA: Diagnosis not present

## 2018-07-22 DIAGNOSIS — F419 Anxiety disorder, unspecified: Secondary | ICD-10-CM | POA: Diagnosis not present

## 2018-07-22 DIAGNOSIS — F0280 Dementia in other diseases classified elsewhere without behavioral disturbance: Secondary | ICD-10-CM | POA: Diagnosis not present

## 2018-07-22 DIAGNOSIS — G301 Alzheimer's disease with late onset: Secondary | ICD-10-CM | POA: Diagnosis not present

## 2018-07-24 DIAGNOSIS — M159 Polyosteoarthritis, unspecified: Secondary | ICD-10-CM | POA: Diagnosis not present

## 2018-08-21 DIAGNOSIS — M79675 Pain in left toe(s): Secondary | ICD-10-CM | POA: Diagnosis not present

## 2018-08-21 DIAGNOSIS — M79674 Pain in right toe(s): Secondary | ICD-10-CM | POA: Diagnosis not present

## 2018-08-21 DIAGNOSIS — B351 Tinea unguium: Secondary | ICD-10-CM | POA: Diagnosis not present

## 2018-08-25 DIAGNOSIS — R05 Cough: Secondary | ICD-10-CM | POA: Diagnosis not present

## 2018-08-25 DIAGNOSIS — R509 Fever, unspecified: Secondary | ICD-10-CM | POA: Diagnosis not present

## 2018-08-25 DIAGNOSIS — R0981 Nasal congestion: Secondary | ICD-10-CM | POA: Diagnosis not present

## 2018-08-26 DIAGNOSIS — J111 Influenza due to unidentified influenza virus with other respiratory manifestations: Secondary | ICD-10-CM | POA: Diagnosis not present

## 2018-08-26 DIAGNOSIS — G301 Alzheimer's disease with late onset: Secondary | ICD-10-CM | POA: Diagnosis not present

## 2018-08-26 DIAGNOSIS — R509 Fever, unspecified: Secondary | ICD-10-CM | POA: Diagnosis not present

## 2018-08-26 DIAGNOSIS — F0280 Dementia in other diseases classified elsewhere without behavioral disturbance: Secondary | ICD-10-CM | POA: Diagnosis not present

## 2018-09-09 DIAGNOSIS — F419 Anxiety disorder, unspecified: Secondary | ICD-10-CM | POA: Diagnosis not present

## 2018-09-09 DIAGNOSIS — G301 Alzheimer's disease with late onset: Secondary | ICD-10-CM | POA: Diagnosis not present

## 2018-09-12 DIAGNOSIS — F0390 Unspecified dementia without behavioral disturbance: Secondary | ICD-10-CM | POA: Diagnosis not present

## 2018-09-12 DIAGNOSIS — F329 Major depressive disorder, single episode, unspecified: Secondary | ICD-10-CM | POA: Diagnosis not present

## 2018-09-12 DIAGNOSIS — F411 Generalized anxiety disorder: Secondary | ICD-10-CM | POA: Diagnosis not present

## 2018-09-12 DIAGNOSIS — R4189 Other symptoms and signs involving cognitive functions and awareness: Secondary | ICD-10-CM | POA: Diagnosis not present

## 2019-04-12 DEATH — deceased
